# Patient Record
Sex: Female | Born: 1940
Health system: Southern US, Community
[De-identification: ages and names within clinical notes are randomized; demographics above are authoritative.]

## PROBLEM LIST (undated history)

## (undated) DIAGNOSIS — H9190 Unspecified hearing loss, unspecified ear: Secondary | ICD-10-CM

## (undated) DIAGNOSIS — F419 Anxiety disorder, unspecified: Secondary | ICD-10-CM

## (undated) DIAGNOSIS — F32A Depression, unspecified: Secondary | ICD-10-CM

## (undated) DIAGNOSIS — H9192 Unspecified hearing loss, left ear: Secondary | ICD-10-CM

## (undated) DIAGNOSIS — K810 Acute cholecystitis: Secondary | ICD-10-CM

## (undated) DIAGNOSIS — F329 Major depressive disorder, single episode, unspecified: Secondary | ICD-10-CM

## (undated) DIAGNOSIS — D649 Anemia, unspecified: Secondary | ICD-10-CM

## (undated) DIAGNOSIS — L719 Rosacea, unspecified: Secondary | ICD-10-CM

## (undated) DIAGNOSIS — E119 Type 2 diabetes mellitus without complications: Secondary | ICD-10-CM

## (undated) DIAGNOSIS — T7840XA Allergy, unspecified, initial encounter: Secondary | ICD-10-CM

## (undated) DIAGNOSIS — K219 Gastro-esophageal reflux disease without esophagitis: Secondary | ICD-10-CM

## (undated) DIAGNOSIS — E039 Hypothyroidism, unspecified: Secondary | ICD-10-CM

## (undated) DIAGNOSIS — C189 Malignant neoplasm of colon, unspecified: Secondary | ICD-10-CM

## (undated) DIAGNOSIS — Z8619 Personal history of other infectious and parasitic diseases: Secondary | ICD-10-CM

## (undated) DIAGNOSIS — G709 Myoneural disorder, unspecified: Secondary | ICD-10-CM

## (undated) DIAGNOSIS — E860 Dehydration: Secondary | ICD-10-CM

## (undated) DIAGNOSIS — T63331A Toxic effect of venom of brown recluse spider, accidental (unintentional), initial encounter: Secondary | ICD-10-CM

## (undated) DIAGNOSIS — R32 Unspecified urinary incontinence: Secondary | ICD-10-CM

## (undated) DIAGNOSIS — J302 Other seasonal allergic rhinitis: Secondary | ICD-10-CM

## (undated) DIAGNOSIS — Z862 Personal history of diseases of the blood and blood-forming organs and certain disorders involving the immune mechanism: Secondary | ICD-10-CM

## (undated) DIAGNOSIS — M1990 Unspecified osteoarthritis, unspecified site: Secondary | ICD-10-CM

## (undated) DIAGNOSIS — Z9221 Personal history of antineoplastic chemotherapy: Secondary | ICD-10-CM

## (undated) DIAGNOSIS — E785 Hyperlipidemia, unspecified: Secondary | ICD-10-CM

## (undated) DIAGNOSIS — R9431 Abnormal electrocardiogram [ECG] [EKG]: Secondary | ICD-10-CM

## (undated) HISTORY — DX: Acute cholecystitis: K81.0

## (undated) HISTORY — DX: Hypothyroidism, unspecified: E03.9

## (undated) HISTORY — DX: Personal history of diseases of the blood and blood-forming organs and certain disorders involving the immune mechanism: Z86.2

## (undated) HISTORY — DX: Other seasonal allergic rhinitis: J30.2

## (undated) HISTORY — DX: Unspecified hearing loss, unspecified ear: H91.90

## (undated) HISTORY — DX: Gastro-esophageal reflux disease without esophagitis: K21.9

## (undated) HISTORY — DX: Anemia, unspecified: D64.9

## (undated) HISTORY — DX: Unspecified osteoarthritis, unspecified site: M19.90

## (undated) HISTORY — DX: Personal history of other infectious and parasitic diseases: Z86.19

## (undated) HISTORY — DX: Malignant neoplasm of colon, unspecified: C18.9

## (undated) HISTORY — DX: Anxiety disorder, unspecified: F41.9

## (undated) HISTORY — DX: Depression, unspecified: F32.A

## (undated) HISTORY — DX: Unspecified hearing loss, left ear: H91.92

## (undated) HISTORY — PX: ABDOMINAL HYSTERECTOMY: SHX81

## (undated) HISTORY — DX: Abnormal electrocardiogram (ECG) (EKG): R94.31

## (undated) HISTORY — DX: Myoneural disorder, unspecified: G70.9

## (undated) HISTORY — DX: Unspecified urinary incontinence: R32

## (undated) HISTORY — DX: Allergy, unspecified, initial encounter: T78.40XA

## (undated) HISTORY — DX: Type 2 diabetes mellitus without complications: E11.9

## (undated) HISTORY — PX: MOUTH SURGERY: SHX715

## (undated) HISTORY — DX: Dehydration: E86.0

## (undated) HISTORY — DX: Major depressive disorder, single episode, unspecified: F32.9

## (undated) HISTORY — DX: Hyperlipidemia, unspecified: E78.5

## (undated) HISTORY — DX: Rosacea, unspecified: L71.9

## (undated) HISTORY — PX: POLYPECTOMY: SHX149

---
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
## 1983-01-26 HISTORY — PX: TOTAL VAGINAL HYSTERECTOMY: SHX2548

## 1994-01-25 HISTORY — PX: ABDOMINOPLASTY: SUR9

## 1994-01-25 HISTORY — PX: INCONTINENCE SURGERY: SHX676

## 2003-01-26 LAB — HM DEXA SCAN: HM DEXA SCAN: NORMAL

## 2004-01-26 DIAGNOSIS — E119 Type 2 diabetes mellitus without complications: Secondary | ICD-10-CM

## 2004-01-26 HISTORY — DX: Type 2 diabetes mellitus without complications: E11.9

## 2006-08-26 HISTORY — PX: COLONOSCOPY: SHX174

## 2009-04-01 LAB — COMPREHENSIVE METABOLIC PANEL
Alkaline Phosphatase: 82 U/L
Calcium: 9.6 mg/dL
Sodium: 142 mmol/L (ref 137–147)

## 2009-04-01 LAB — LIPID PANEL
Cholesterol, Total: 235
Direct LDL: 157
HDL: 47 mg/dL (ref 35–70)

## 2010-11-26 ENCOUNTER — Ambulatory Visit (INDEPENDENT_AMBULATORY_CARE_PROVIDER_SITE_OTHER): Payer: BC Managed Care – PPO | Admitting: Family Medicine

## 2010-11-26 ENCOUNTER — Encounter: Payer: Self-pay | Admitting: Family Medicine

## 2010-11-26 DIAGNOSIS — Z862 Personal history of diseases of the blood and blood-forming organs and certain disorders involving the immune mechanism: Secondary | ICD-10-CM

## 2010-11-26 DIAGNOSIS — Z23 Encounter for immunization: Secondary | ICD-10-CM

## 2010-11-26 DIAGNOSIS — E039 Hypothyroidism, unspecified: Secondary | ICD-10-CM

## 2010-11-26 DIAGNOSIS — R32 Unspecified urinary incontinence: Secondary | ICD-10-CM

## 2010-11-26 DIAGNOSIS — N3941 Urge incontinence: Secondary | ICD-10-CM | POA: Insufficient documentation

## 2010-11-26 DIAGNOSIS — F331 Major depressive disorder, recurrent, moderate: Secondary | ICD-10-CM | POA: Insufficient documentation

## 2010-11-26 DIAGNOSIS — F329 Major depressive disorder, single episode, unspecified: Secondary | ICD-10-CM

## 2010-11-26 DIAGNOSIS — E1169 Type 2 diabetes mellitus with other specified complication: Secondary | ICD-10-CM | POA: Insufficient documentation

## 2010-11-26 DIAGNOSIS — R7303 Prediabetes: Secondary | ICD-10-CM | POA: Insufficient documentation

## 2010-11-26 DIAGNOSIS — E119 Type 2 diabetes mellitus without complications: Secondary | ICD-10-CM

## 2010-11-26 DIAGNOSIS — D509 Iron deficiency anemia, unspecified: Secondary | ICD-10-CM | POA: Insufficient documentation

## 2010-11-26 DIAGNOSIS — K219 Gastro-esophageal reflux disease without esophagitis: Secondary | ICD-10-CM

## 2010-11-26 NOTE — Assessment & Plan Note (Signed)
Mild. Check TSH today.

## 2010-11-26 NOTE — Progress Notes (Signed)
Addended by: Josph Macho A on: 11/26/2010 04:08 PM   Modules accepted: Orders

## 2010-11-26 NOTE — Progress Notes (Addendum)
Subjective:    Patient ID: Lisa Osborne, female    DOB: May 27, 1940, 70 y.o.   MRN: [id number]  HPI CC: new pt, establish  Recently moved here from [place] to be closer to daughter and grandson.  Brings copy of records.  Will review and input into chart, then call pt to pick up her records.  Scabies issue for last 6 weeks, now clearing up well.  Just has scars left over.  Used homeopathic remedy.   T2DM - dx 2006.  Never been on meds for this.  Diet controlled.  Last A1c was 1 year ago, <7%, unsure specific.  Occasionally checks sugars. Keeps eye on feet.  Due for vision check.  Depression - severe.  States inherited from family.  Tries to monitor.  2 years ago had SAD.  Last year did fine.  On zoloft for 25 years.  No SI/HI.  Well controlled currently.  GERD - if misses dose of PPI, terrible heart burn at night.  Takes lansoprazole since 1985.  Urinary incontinence - for last 4-5 years, noticing some accident/dribbling, more when turning on left side or when sitting up from commode feels urine leak.  No stress accidents.  Some urge incontinence.  Wears pad.  Avoids drinking fluid at night.  Had bladder sling done in 1990s.  Preventative: Last CPE 1 year ago, had blood work then. Last pap was 1 year ago, always normal. Last mammogram 1 year ago, always normal. Colonoscopy - 2010, told rpt in 10 years. Tetanus - long time >10 yrs. Would like flu shot today. Pneumonia shot - not done. Shingles shot - not done.  Medications and allergies reviewed and updated in chart.  Past histories reviewed and updated if relevant as below. There is no problem list on file for this patient.  Past Medical History  Diagnosis Date  . History of iron deficiency anemia   . Arthritis     left side lower back pain  . History of chicken pox   . Depression     severe in past  . Diabetes mellitus type 2, controlled 2006    diet controlled  . GERD (gastroesophageal reflux disease)     bad if off PPI    . Seasonal allergies   . Hypothyroidism   . Urine incontinence     mild, wears pad   Past Surgical History  Procedure Date  . Total vaginal hysterectomy 1985    questionable cells on colposcopy  . Abdominoplasty 1996  . Incontinence surgery 1996   History  Substance Use Topics  . Smoking status: Never Smoker   . Smokeless tobacco: Never Used  . Alcohol Use: Yes     seldom   Family History  Problem Relation Age of Onset  . Cancer Mother 23    breast cancer then lymphoma  . Alcohol abuse Father   . Heart disease Father     heart failure  . Mental illness Brother     2 brothers severe depression, suicide x2  . Diabetes Maternal Aunt   . Diabetes Maternal Uncle   . Diabetes Paternal Uncle   . Diabetes Paternal Grandfather   . Coronary artery disease Neg Hx   . Stroke Neg Hx    Allergies  Allergen Reactions  . Nickel     Slight itch  . Shrimp (Shellfish Allergy)     Slight allergy - itching   No current outpatient prescriptions on file prior to visit.     Review of Systems  Constitutional: Negative for fever, chills, activity change, appetite change, fatigue and unexpected weight change (16 lbs down, dieting, watching portions).  HENT: Negative for hearing loss and neck pain.   Eyes: Negative for visual disturbance.  Respiratory: Negative for cough, chest tightness, shortness of breath and wheezing.   Gastrointestinal: Negative for nausea, vomiting, abdominal pain, diarrhea, constipation, blood in stool and abdominal distention.  Genitourinary: Negative for hematuria and difficulty urinating.  Musculoskeletal: Negative for myalgias and arthralgias.  Skin: Positive for rash.  Neurological: Negative for dizziness, seizures, syncope and headaches.  Hematological: Does not bruise/bleed easily.  Psychiatric/Behavioral: Positive for dysphoric mood. The patient is not nervous/anxious.        Objective:   Physical Exam  Nursing note and vitals  reviewed. Constitutional: She is oriented to person, place, and time. She appears well-developed and well-nourished. No distress.  HENT:  Head: Normocephalic and atraumatic.  Right Ear: External ear normal.  Left Ear: External ear normal.  Nose: Nose normal.  Mouth/Throat: Oropharynx is clear and moist.  Eyes: Conjunctivae and EOM are normal. Pupils are equal, round, and reactive to light.  Neck: Normal range of motion. Neck supple. No thyromegaly present.  Cardiovascular: Normal rate, regular rhythm, normal heart sounds and intact distal pulses.   No murmur heard. Pulses:      Radial pulses are 2+ on the right side, and 2+ on the left side.  Pulmonary/Chest: Effort normal and breath sounds normal. No respiratory distress. She has no wheezes. She has no rales.  Abdominal: Soft. Bowel sounds are normal. She exhibits no distension and no mass. There is no tenderness. There is no rebound and no guarding.  Musculoskeletal: Normal range of motion.  Lymphadenopathy:    She has no cervical adenopathy.  Neurological: She is alert and oriented to person, place, and time.       CN grossly intact, station and gait intact  Skin: Skin is warm and dry. No rash noted.  Psychiatric: She has a normal mood and affect. Her behavior is normal. Judgment and thought content normal.      Assessment & Plan:

## 2010-11-26 NOTE — Assessment & Plan Note (Signed)
Very well controlled per pt just with diet changes. Never had A1c >7%. Check today as states >1 yr since last checked.  Rarely checks sugars. rec vision screen.

## 2010-11-26 NOTE — Assessment & Plan Note (Signed)
Check cbc 

## 2010-11-26 NOTE — Assessment & Plan Note (Signed)
Controlled with low dose prevacid 15mg  daily.

## 2010-11-26 NOTE — Patient Instructions (Addendum)
Call your insurace about the shingles shot to see if it is covered or how much it would cost and where is cheaper (here or pharmacy).  If you want to receive here, call for nurse visit. Return for physical at your convenience March or April of next year. Return prior fasting for blood work and we will check things then. We have checked A1c today as well as kidney function. Good to see you today! Flu shot today. Set up vision screen.

## 2010-11-26 NOTE — Assessment & Plan Note (Signed)
Currently not an issue. Pt knows to continue to monitor.

## 2010-11-27 LAB — CBC WITH DIFFERENTIAL/PLATELET
Basophils Absolute: 0 10*3/uL (ref 0.0–0.1)
Eosinophils Relative: 0.6 % (ref 0.0–5.0)
HCT: 36.1 % (ref 36.0–46.0)
Lymphs Abs: 1.7 10*3/uL (ref 0.7–4.0)
Monocytes Absolute: 0.3 10*3/uL (ref 0.1–1.0)
Monocytes Relative: 4.7 % (ref 3.0–12.0)
Neutrophils Relative %: 62.8 % (ref 43.0–77.0)
Platelets: 220 10*3/uL (ref 150.0–400.0)
RDW: 14.1 % (ref 11.5–14.6)
WBC: 5.5 10*3/uL (ref 4.5–10.5)

## 2010-11-27 LAB — BASIC METABOLIC PANEL
Calcium: 8.8 mg/dL (ref 8.4–10.5)
GFR: 62.77 mL/min (ref >60.00)
Potassium: 4.4 mEq/L (ref 3.5–5.1)
Sodium: 142 mEq/L (ref 135–145)

## 2010-11-27 LAB — HEMOGLOBIN A1C: Hgb A1c MFr Bld: 6.2 % (ref 4.6–6.5)

## 2010-11-27 LAB — TSH: TSH: 1.69 u[IU]/mL (ref 0.35–5.50)

## 2010-11-29 ENCOUNTER — Encounter: Payer: Self-pay | Admitting: Family Medicine

## 2010-12-07 ENCOUNTER — Encounter: Payer: Self-pay | Admitting: Family Medicine

## 2010-12-21 ENCOUNTER — Other Ambulatory Visit: Payer: Self-pay | Admitting: *Deleted

## 2010-12-21 MED ORDER — LEVOTHYROXINE SODIUM 25 MCG PO TABS: 25.0000 ug | ORAL_TABLET | Freq: Every day | ORAL | Status: DC

## 2011-04-18 ENCOUNTER — Other Ambulatory Visit: Payer: Self-pay | Admitting: Family Medicine

## 2011-05-06 ENCOUNTER — Encounter: Payer: Self-pay | Admitting: Family Medicine

## 2011-05-06 ENCOUNTER — Ambulatory Visit (INDEPENDENT_AMBULATORY_CARE_PROVIDER_SITE_OTHER): Payer: Medicare Other | Admitting: Family Medicine

## 2011-05-06 VITALS — BP 138/90 | HR 81 | Temp 98.2°F | Ht 69.5 in | Wt 234.5 lb

## 2011-05-06 DIAGNOSIS — R21 Rash and other nonspecific skin eruption: Secondary | ICD-10-CM | POA: Insufficient documentation

## 2011-05-06 DIAGNOSIS — B372 Candidiasis of skin and nail: Secondary | ICD-10-CM | POA: Insufficient documentation

## 2011-05-06 MED ORDER — PREDNISONE 20 MG PO TABS: ORAL_TABLET | ORAL | Status: DC

## 2011-05-06 NOTE — Patient Instructions (Addendum)
You have contact dermatitis from poison oak or sumac. Treat with steroid course. Consider oatmeal bath to help itch. Continue calomine and goldbond. No more clorox - too irritating on skin.  Poison [facility] is an inflammation of the skin (contact dermatitis). It is caused by contact with the allergens on the leaves of the oak (toxicodendron) plants. Depending on your sensitivity, the rash may consist simply of redness and itching, or it may also progress to blisters which may break open (rupture). These must be well cared for to prevent secondary germ (bacterial) infection as these infections can lead to scarring. The eyes may also get puffy. The puffiness is worst in the morning and gets better as the day progresses. Healing is best accomplished by keeping any open areas dry, clean, covered with a bandage, and covered with an antibacterial ointment if needed. Without secondary infection, this dermatitis usually heals without scarring within 2 to 3 weeks without treatment. HOME CARE INSTRUCTIONS When you have been exposed to poison oak, it is very important to thoroughly wash with soap and water as soon as the exposure has been discovered. You have about one half hour to remove the plant resin before it will cause the rash. This cleaning will quickly destroy the oil or antigen on the skin (the antigen is what causes the rash). Wash aggressively under the fingernails as any plant resin still there will continue to spread the rash. Do not rub skin vigorously when washing affected area. Poison oak cannot spread if no oil from the plant remains on your body. Rash that has progressed to weeping sores (lesions) will not spread the rash unless you have not washed thoroughly. It is also important to clean any clothes you have been wearing as they may carry active allergens which will spread the rash, even several days later. Avoidance of the plant in the future is the best measure. Poison oak plants can be  recognized by the number of leaves. Generally, poison oak has three leaves with flowering branches on a single stem. Diphenhydramine may be purchased over the counter and used as needed for itching. Do not drive with this medication if it makes you drowsy. Ask your caregiver about medication for children. SEEK IMMEDIATE MEDICAL CARE IF:   Open areas of the rash develop.   You notice redness extending beyond the area of the rash.   There is a pus like discharge.   There is increased pain.   Other signs of infection develop (such as fever).  Document Released: 07/18/2002 Document Revised: 12/31/2010 Document Reviewed: 11/27/2008 [facility] Patient Information 2012 [place], [place].

## 2011-05-06 NOTE — Progress Notes (Signed)
  Subjective:    Patient ID: Lisa Osborne, female    DOB: 1940/12/15, 71 y.o.   MRN: [id number]  HPI CC: skin rash  1 wk ago out in garden working, using gloves to wrist, that evening started having rash on right anterior wrist that started spreading to midriff and abdomen, and now on face.  Using calomine and gold bond lotion  Also took antihistamine this mornning  Very itchy.  Doesn't think any poison ivy/oak/sumac in garden.  Used chlorox wipes and wiped skin on this.  Has never been on oral steroids in past.  No new lotions, detergents, soaps, shampoos, medicines.  Review of Systems Per HPI    Objective:   Physical Exam  Nursing note and vitals reviewed. Constitutional: She appears well-developed and well-nourished. No distress.  HENT:  Head: Normocephalic.  Skin: Skin is warm and dry. Rash noted. There is erythema.          R anterior wrist and under breast with scaly erythematous rash with very pruritic borders, also isolated dry scabbed vesicles on R dorsal wrist and right abdomen. erythematous rash on left side of upper face, pruritic as well.  Psychiatric: She has a normal mood and affect.       Assessment & Plan:

## 2011-05-06 NOTE — Assessment & Plan Note (Addendum)
Contact dermatitis from poison oak/sumac. Given widespread, treat with oral steroids. See pt instructions for plan. Discussed steroid precautions.

## 2011-05-23 ENCOUNTER — Other Ambulatory Visit: Payer: Self-pay | Admitting: Family Medicine

## 2011-05-23 DIAGNOSIS — E119 Type 2 diabetes mellitus without complications: Secondary | ICD-10-CM

## 2011-05-23 DIAGNOSIS — E039 Hypothyroidism, unspecified: Secondary | ICD-10-CM

## 2011-05-23 DIAGNOSIS — Z862 Personal history of diseases of the blood and blood-forming organs and certain disorders involving the immune mechanism: Secondary | ICD-10-CM

## 2011-05-25 ENCOUNTER — Other Ambulatory Visit: Payer: BC Managed Care – PPO

## 2011-05-26 HISTORY — PX: FACELIFT: SHX1566

## 2011-05-28 ENCOUNTER — Encounter: Payer: BC Managed Care – PPO | Admitting: Family Medicine

## 2011-07-08 ENCOUNTER — Other Ambulatory Visit: Payer: BC Managed Care – PPO

## 2011-07-15 ENCOUNTER — Encounter: Payer: BC Managed Care – PPO | Admitting: Family Medicine

## 2011-09-29 ENCOUNTER — Encounter: Payer: Self-pay | Admitting: Family Medicine

## 2011-09-29 ENCOUNTER — Other Ambulatory Visit (INDEPENDENT_AMBULATORY_CARE_PROVIDER_SITE_OTHER): Payer: BC Managed Care – PPO

## 2011-09-29 ENCOUNTER — Ambulatory Visit (INDEPENDENT_AMBULATORY_CARE_PROVIDER_SITE_OTHER): Payer: Medicare Other | Admitting: Family Medicine

## 2011-09-29 VITALS — BP 136/78 | HR 78 | Temp 98.1°F | Wt 233.5 lb

## 2011-09-29 DIAGNOSIS — K921 Melena: Secondary | ICD-10-CM

## 2011-09-29 DIAGNOSIS — Z862 Personal history of diseases of the blood and blood-forming organs and certain disorders involving the immune mechanism: Secondary | ICD-10-CM

## 2011-09-29 DIAGNOSIS — E119 Type 2 diabetes mellitus without complications: Secondary | ICD-10-CM

## 2011-09-29 DIAGNOSIS — R300 Dysuria: Secondary | ICD-10-CM | POA: Insufficient documentation

## 2011-09-29 DIAGNOSIS — E039 Hypothyroidism, unspecified: Secondary | ICD-10-CM

## 2011-09-29 LAB — CBC WITH DIFFERENTIAL/PLATELET
Basophils Relative: 0.3 % (ref 0.0–3.0)
Eosinophils Relative: 0.5 % (ref 0.0–5.0)
HCT: 31.7 % — ABNORMAL LOW (ref 36.0–46.0)
Lymphs Abs: 2.4 10*3/uL (ref 0.7–4.0)
MCV: 86.3 fl (ref 78.0–100.0)
Monocytes Absolute: 0.6 10*3/uL (ref 0.1–1.0)
Platelets: 278 10*3/uL (ref 150.0–400.0)
RBC: 3.67 Mil/uL — ABNORMAL LOW (ref 3.87–5.11)
WBC: 6.5 10*3/uL (ref 4.5–10.5)

## 2011-09-29 LAB — POCT URINALYSIS DIPSTICK
Bilirubin, UA: NEGATIVE
Blood, UA: NEGATIVE
Glucose, UA: NEGATIVE
Ketones, UA: NEGATIVE
Nitrite, UA: NEGATIVE

## 2011-09-29 LAB — LIPID PANEL
Cholesterol: 212 mg/dL — ABNORMAL HIGH (ref 0–200)
Total CHOL/HDL Ratio: 4
VLDL: 28.6 mg/dL (ref 0.0–40.0)

## 2011-09-29 LAB — COMPREHENSIVE METABOLIC PANEL
ALT: 13 U/L (ref 0–35)
AST: 22 U/L (ref 0–37)
Calcium: 9.1 mg/dL (ref 8.4–10.5)
Chloride: 103 mEq/L (ref 96–112)
Creatinine, Ser: 1 mg/dL (ref 0.4–1.2)
Sodium: 136 mEq/L (ref 135–145)

## 2011-09-29 MED ORDER — LEVOTHYROXINE SODIUM 25 MCG PO TABS: 25.0000 ug | ORAL_TABLET | Freq: Every day | ORAL | Status: DC

## 2011-09-29 MED ORDER — CIPROFLOXACIN HCL 500 MG PO TABS: 500.0000 mg | ORAL_TABLET | Freq: Two times a day (BID) | ORAL | Status: AC

## 2011-09-29 MED ORDER — SERTRALINE HCL 50 MG PO TABS: 50.0000 mg | ORAL_TABLET | Freq: Every day | ORAL | Status: DC

## 2011-09-29 NOTE — Patient Instructions (Addendum)
I think you had irritation from food junk leading to looser stools which in turn led to sore and raw bottom.  As it's getting better, would just continue treatment as up to now with warm compresses, warm water soaks. Blood work today to check on anemia. Bring copy of records from colonoscopy from 2008. For urine - looks like infection. Treat with 3 day course of cipro twice daily.

## 2011-09-29 NOTE — Assessment & Plan Note (Addendum)
New problem in setting of normal colonoscopy 2008 per report. Never abd or rectal abd pain. Hemoccult + today. Recent diarrheal illness with significant junk food intake, along with raw bottom which could have caused noted blood, however story more consistent with GI source of bleed. As seems to be improving, will monitor for now and reassess at CPE in 2 wks.  Check CBC today (for physical and for recent blood in stool). I have also asked her to bring copy of colonoscopy report 2008, or may request at CPE.

## 2011-09-29 NOTE — Progress Notes (Signed)
  Subjective:    Patient ID: Lisa Osborne, female    DOB: 1940/02/07, 71 y.o.   MRN: [id number]  HPI CC: rectal bleeding  Recently drove to [place] - did eat a lot of junk food and fast food all the way there to stay awake.  On trip, stool was dark and watery, diarrhea, bottom became very raw.  Endorses dark red stool.  No black tarry stool.  Hard to clean.  On trip back had same problem.  Treated bottom area with hot compress and ensured area clean using OTC wipes.  No abd pain, n/v, constipation, fevers/chills, weight loss.  For last 2 days, stool back to normal, more regular. Bottom less raw now.  Hasn't noticed any more dark red watery stools.  H/o colonoscopy 2008 in [place] - told normal and good for 10 years.  Will bring records at physical which is scheduled 10/12/2011.  ?UTI - h/o urinary incontinence.  Now with 3-4d h/o worsening itching, external burning, dysuria, urgency and frequency.  Nocturia x6-7.  No lower back pain.  No abd pain or fevers/chills.  No vag discharge.  Wt Readings from Last 3 Encounters:  09/29/11 233 lb 8 oz (105.915 kg)  05/06/11 234 lb 8 oz (106.369 kg)  11/26/10 226 lb 12.8 oz (102.876 kg)   Past Medical History  Diagnosis Date  . History of iron deficiency anemia   . Arthritis     left side lower back pain  . History of chicken pox   . Depression     severe in past  . Diabetes mellitus type 2, controlled 2006    diet controlled  . GERD (gastroesophageal reflux disease)     bad if off PPI  . Seasonal allergies   . Hypothyroidism     mild  . Urine incontinence     mild, wears pad  . Unilateral deafnesses     left ear deaf, wears hearing aid right side  . Rosacea     Review of Systems Per HPI    Objective:   Physical Exam  Nursing note and vitals reviewed. Constitutional: She appears well-developed and well-nourished. No distress.  Abdominal: Soft. Normal appearance and bowel sounds are normal. She exhibits no distension and no mass.  There is no hepatosplenomegaly. There is tenderness (mild pressure to palpation) in the suprapubic area. There is no rebound, no guarding and no CVA tenderness.  Genitourinary: Rectal exam shows external hemorrhoid (small, noninflamed). Rectal exam shows no internal hemorrhoid, no fissure, no mass, no tenderness and anal tone normal. Guaiac positive stool.       Minimally raw bottom  Skin: Skin is warm and dry. No rash noted.      Assessment & Plan:

## 2011-09-29 NOTE — Assessment & Plan Note (Signed)
With worsening urge, UA with 1+ LE but micro with large amt of epithelial cells.  Have sent off culture, but will treat as presumed UTI with 3d course cipro.

## 2011-10-01 LAB — URINE CULTURE

## 2011-10-12 ENCOUNTER — Ambulatory Visit (INDEPENDENT_AMBULATORY_CARE_PROVIDER_SITE_OTHER): Payer: BC Managed Care – PPO | Admitting: Family Medicine

## 2011-10-12 ENCOUNTER — Encounter: Payer: Self-pay | Admitting: Family Medicine

## 2011-10-12 VITALS — BP 116/78 | HR 84 | Temp 98.5°F | Ht 69.5 in | Wt 235.8 lb

## 2011-10-12 DIAGNOSIS — E039 Hypothyroidism, unspecified: Secondary | ICD-10-CM

## 2011-10-12 DIAGNOSIS — D509 Iron deficiency anemia, unspecified: Secondary | ICD-10-CM

## 2011-10-12 DIAGNOSIS — F329 Major depressive disorder, single episode, unspecified: Secondary | ICD-10-CM

## 2011-10-12 DIAGNOSIS — N3941 Urge incontinence: Secondary | ICD-10-CM

## 2011-10-12 DIAGNOSIS — Z1231 Encounter for screening mammogram for malignant neoplasm of breast: Secondary | ICD-10-CM

## 2011-10-12 DIAGNOSIS — Z0000 Encounter for general adult medical examination without abnormal findings: Secondary | ICD-10-CM | POA: Insufficient documentation

## 2011-10-12 DIAGNOSIS — E119 Type 2 diabetes mellitus without complications: Secondary | ICD-10-CM

## 2011-10-12 DIAGNOSIS — Z1211 Encounter for screening for malignant neoplasm of colon: Secondary | ICD-10-CM

## 2011-10-12 DIAGNOSIS — K921 Melena: Secondary | ICD-10-CM

## 2011-10-12 MED ORDER — OXYBUTYNIN CHLORIDE 5 MG PO TABS: 5.0000 mg | ORAL_TABLET | Freq: Three times a day (TID) | ORAL | Status: DC | PRN

## 2011-10-12 NOTE — Patient Instructions (Addendum)
Return in 1 mo for flu shot or at [facility]. Pass by Marion's office to schedule screening mammogram around in 1 month. Stool kit today - complete next month and return. Call your insurace about the shingles shot to see if it is covered or how much it would cost and where is cheaper (here or pharmacy).  If you want to receive here, call for nurse visit.  Try bladder medicine - oxybutynin twice to three times daily as needed.  Let me know how this is working. Return in 6 months for follow up or sooner if needed.

## 2011-10-12 NOTE — Assessment & Plan Note (Signed)
I have personally reviewed the Medicare Annual Wellness questionnaire and have noted 1. The patient's medical and social history 2. Their use of alcohol, tobacco or illicit drugs 3. Their current medications and supplements 4. The patient's functional ability including ADL's, fall risks, home safety risks and hearing or visual impairment. 5. Diet and physical activity 6. Evidence for depression or mood disorders The patients weight, height, BMI have been recorded in the chart.  Hearing and vision has been addressed. I have made referrals, counseling and provided education to the patient based review of the above and I have provided the pt with a written personalized care plan for preventive services. See scanned questionairre.  Reviewed preventative protocols and updated unless pt declined.  Declines flu shot - will receive in 1-2 mo. Will look into shingles shot. Will need discussion on dexa scan.

## 2011-10-12 NOTE — Assessment & Plan Note (Signed)
Has ups and downs.  Stresses out easily.  Tolerant of current sertraline dose - longterm med, strong family hx and strong h/o suicide.

## 2011-10-12 NOTE — Assessment & Plan Note (Signed)
UTD colonoscopy - however given blood in stool last visit, will check earlier iFOB, asked her to complete next month.  If continued positive, refer to GI.  Pt agrees with plan. In setting of anemia, presumed iron def (h/o this).  Taking iron supplement daily.

## 2011-10-12 NOTE — Assessment & Plan Note (Signed)
Continues diet controlled. Lab Results  Component Value Date   HGBA1C 6.5 09/29/2011

## 2011-10-12 NOTE — Progress Notes (Addendum)
Subjective:    Patient ID: Lisa Osborne, female    DOB: [birth year], 71 y.o.   MRN: [id number]  HPI CC: medicare wellness visit  Wt Readings from Last 3 Encounters:  10/12/11 235 lb 12 oz (106.935 kg)  09/29/11 233 lb 8 oz (105.915 kg)  05/06/11 234 lb 8 oz (106.369 kg)   Bowel issues resolved.  Hasn't noticed any more bleeding.  Positive hemoccult last visit with noted anemia.  This was in setting of presumed enteritis that has since resolved.  Brings records of colonoscopy done 08/2004, will input into chart.  Some DOE, attributes to deconditioning after facelift, slowly increasing stamina, [place]recumbent bike and treadmill[place]  Urinary incontinence - worsening over last year.  Significant nocturia.  Some leakage during day.  + key in door and foot on floor phenomenon.  Not worse with cough/sneeze/laughing.  Does not drink anything after 6pm.  Minimal caffeine daily - has cut back this month.  H/o bladder sling several years ago.  Has never tried meds for urinary incontinence.  Wears pad daily 2/2 leaking.  Preventative: Colonoscopy WNL 08/2006.  rec rpt 10 yrs. Last pap was 1 year ago, always normal. Will space out - would like to repeat in 2015 Last mammogram 1 year ago, always normal. Would like to schedule in 1 mo (financial concerns Tetanus - 2010 Flu shot next month. Shingles shot - not done because to expensive. Pneumonia shot completed.  No falls in last year.  hearing passed (known hearing loss on left) Vision screen passed.  Last glaucoma screen was >2 yrs ago.  Caffeine: 1 cup coffee/day Lives alone.  1 daughter Victorino Dike), 3 stillborns Occupation: retired, was Air traffic controller. Activity: no regular activity but does park far away, has treadmill at home Diet: good fruits/vegetables, more fruit/vegetable smoothies  good amt water  Medications and allergies reviewed and updated in chart.  Past histories reviewed and updated if relevant as  below. Patient Active Problem List  Diagnosis  . History of iron deficiency anemia  . Depression  . Diabetes mellitus type 2, controlled  . GERD (gastroesophageal reflux disease)  . Hypothyroidism  . Urine incontinence  . Skin rash  . Dysuria  . Blood in stool  . Medicare annual wellness visit, initial   Past Medical History  Diagnosis Date  . History of iron deficiency anemia   . Arthritis     left side lower back pain  . History of chicken pox   . Depression     severe in past  . Diabetes mellitus type 2, controlled 2006    diet controlled  . GERD (gastroesophageal reflux disease)     bad if off PPI  . Seasonal allergies   . Hypothyroidism     mild  . Urine incontinence     mild, wears pad  . Unilateral deafnesses     left ear deaf, wears hearing aid right side  . Rosacea    Past Surgical History  Procedure Date  . Total vaginal hysterectomy 1985    cancer cells on cervix  . Abdominoplasty 1996  . Incontinence surgery 1996    bladder sling  . Colonoscopy 08/2006    normal  . Dexa 08/06/2003    normal  . Facelift 05/2011   History  Substance Use Topics  . Smoking status: Never Smoker   . Smokeless tobacco: Never Used  . Alcohol Use: Yes     seldom   Family History  Problem Relation  Age of Onset  . Cancer Mother 67    breast cancer then multiple myeloma  . Alcohol abuse Father   . Heart disease Father     heart failure  . Mental illness Brother     2 brothers severe depression, suicide x2  . Diabetes Maternal Aunt   . Diabetes Maternal Uncle   . Diabetes Paternal Uncle   . Diabetes Paternal Grandfather   . Coronary artery disease Neg Hx   . Stroke Neg Hx    Allergies  Allergen Reactions  . Latex     Mild reaction  . Nickel     Slight itch  . Shrimp (Shellfish Allergy)     Slight allergy - itching   Current Outpatient Prescriptions on File Prior to Visit  Medication Sig Dispense Refill  . calcium gluconate 500 MG tablet Take 500 mg by  mouth daily.        . cholecalciferol (VITAMIN D) 1000 UNITS tablet Take 1,000 Units by mouth daily.        . ferrous sulfate 325 (65 FE) MG tablet Take 325 mg by mouth daily with breakfast.      . lansoprazole (PREVACID) 15 MG capsule Take 15 mg by mouth daily.        Marland Kitchen levothyroxine (SYNTHROID, LEVOTHROID) 25 MCG tablet Take 1 tablet (25 mcg total) by mouth daily.  90 tablet  3  . Multiple Vitamin (MULTIVITAMIN) tablet Take 1 tablet by mouth daily.        . NON FORMULARY 2 tablets daily. Memory and Brain with Acetyl L-Carnitine       . sertraline (ZOLOFT) 50 MG tablet Take 1 tablet (50 mg total) by mouth daily.  90 tablet  3  . oxybutynin (DITROPAN) 5 MG tablet Take 1 tablet (5 mg total) by mouth 3 (three) times daily as needed.  60 tablet  0     Review of Systems  Constitutional: Negative for fever, chills, activity change, appetite change, fatigue and unexpected weight change.  HENT: Negative for hearing loss and neck pain.   Eyes: Negative for visual disturbance.  Respiratory: Negative for cough, chest tightness, shortness of breath and wheezing.   Cardiovascular: Negative for chest pain, palpitations and leg swelling.  Gastrointestinal: Negative for nausea, vomiting, abdominal pain, diarrhea, constipation, blood in stool and abdominal distention.  Genitourinary: Negative for hematuria and difficulty urinating.  Musculoskeletal: Negative for myalgias and arthralgias.  Skin: Negative for rash.  Neurological: Negative for dizziness, seizures, syncope and headaches.  Hematological: Does not bruise/bleed easily.  Psychiatric/Behavioral: Negative for dysphoric mood. The patient is nervous/anxious.       Objective:   Physical Exam  Nursing note and vitals reviewed. Constitutional: She is oriented to person, place, and time. She appears well-developed and well-nourished. No distress.  HENT:  Head: Normocephalic and atraumatic.  Right Ear: Hearing, tympanic membrane and external ear  normal.  Left Ear: Hearing, tympanic membrane, external ear and ear canal normal.  Nose: Nose normal.  Mouth/Throat: Oropharynx is clear and moist. No oropharyngeal exudate.  Eyes: Conjunctivae normal and EOM are normal. Pupils are equal, round, and reactive to light. No scleral icterus.  Neck: Normal range of motion. Neck supple. Carotid bruit is not present. No thyromegaly present.  Cardiovascular: Normal rate, regular rhythm, normal heart sounds and intact distal pulses.   No murmur heard. Pulses:      Radial pulses are 2+ on the right side, and 2+ on the left side.  Pulmonary/Chest: Effort normal and  breath sounds normal. No respiratory distress. She has no wheezes. She has no rales. Right breast exhibits no inverted nipple, no mass, no nipple discharge, no skin change and no tenderness. Left breast exhibits no inverted nipple, no mass, no nipple discharge, no skin change and no tenderness. Breasts are symmetrical.  Abdominal: Soft. Bowel sounds are normal. She exhibits no distension and no mass. There is no tenderness. There is no rebound and no guarding.  Musculoskeletal: Normal range of motion. She exhibits no edema.  Lymphadenopathy:    She has no cervical adenopathy.    She has no axillary adenopathy.       Right axillary: No lateral adenopathy present.       Left axillary: No lateral adenopathy present.      Right: No supraclavicular adenopathy present.       Left: No supraclavicular adenopathy present.  Neurological: She is alert and oriented to person, place, and time.       CN grossly intact, station and gait intact  Skin: Skin is warm and dry. No rash noted.  Psychiatric: She has a normal mood and affect.       Assessment & Plan:

## 2011-10-12 NOTE — Assessment & Plan Note (Signed)
Stable on current dose. Continue.

## 2011-10-12 NOTE — Assessment & Plan Note (Addendum)
Discussed this.  Anticipate urge incontinence. Trial of oxybutynin tid prn. H/o bladder sling 1996.

## 2011-10-12 NOTE — Assessment & Plan Note (Signed)
Consider rechecking levels to ensure rising. Did not recheck today as diarrhea has resolved and feeling much better. Denies dizziness.

## 2011-11-11 ENCOUNTER — Other Ambulatory Visit: Payer: Self-pay | Admitting: Family Medicine

## 2011-11-30 ENCOUNTER — Ambulatory Visit: Payer: Self-pay | Admitting: Family Medicine

## 2011-12-06 ENCOUNTER — Encounter: Payer: Self-pay | Admitting: Family Medicine

## 2011-12-07 ENCOUNTER — Encounter: Payer: Self-pay | Admitting: *Deleted

## 2012-01-03 ENCOUNTER — Other Ambulatory Visit: Payer: Self-pay | Admitting: Family Medicine

## 2012-01-03 ENCOUNTER — Other Ambulatory Visit: Payer: Medicare Other

## 2012-01-03 DIAGNOSIS — Z1211 Encounter for screening for malignant neoplasm of colon: Secondary | ICD-10-CM

## 2012-01-03 DIAGNOSIS — R195 Other fecal abnormalities: Secondary | ICD-10-CM

## 2012-01-03 DIAGNOSIS — D509 Iron deficiency anemia, unspecified: Secondary | ICD-10-CM

## 2012-01-03 LAB — FECAL OCCULT BLOOD, IMMUNOCHEMICAL: Fecal Occult Bld: POSITIVE

## 2012-01-06 ENCOUNTER — Other Ambulatory Visit (INDEPENDENT_AMBULATORY_CARE_PROVIDER_SITE_OTHER): Payer: Medicare Other

## 2012-01-06 ENCOUNTER — Encounter: Payer: Self-pay | Admitting: Gastroenterology

## 2012-01-06 DIAGNOSIS — D509 Iron deficiency anemia, unspecified: Secondary | ICD-10-CM

## 2012-01-06 LAB — FERRITIN: Ferritin: 47.5 ng/mL (ref 10.0–291.0)

## 2012-01-06 LAB — CBC WITH DIFFERENTIAL/PLATELET
Eosinophils Relative: 0.7 % (ref 0.0–5.0)
Lymphocytes Relative: 36.8 % (ref 12.0–46.0)
Monocytes Relative: 7.4 % (ref 3.0–12.0)
Neutrophils Relative %: 54.8 % (ref 43.0–77.0)
Platelets: 248 10*3/uL (ref 150.0–400.0)
WBC: 5.8 10*3/uL (ref 4.5–10.5)

## 2012-01-06 LAB — IBC PANEL
Iron: 171 ug/dL — ABNORMAL HIGH (ref 42–145)
Transferrin: 325 mg/dL (ref 212.0–360.0)

## 2012-02-14 ENCOUNTER — Encounter: Payer: Self-pay | Admitting: Gastroenterology

## 2012-02-14 ENCOUNTER — Ambulatory Visit (INDEPENDENT_AMBULATORY_CARE_PROVIDER_SITE_OTHER): Payer: Medicare PPO | Admitting: Gastroenterology

## 2012-02-14 VITALS — BP 120/72 | HR 92 | Ht 69.5 in | Wt 229.2 lb

## 2012-02-14 DIAGNOSIS — D649 Anemia, unspecified: Secondary | ICD-10-CM

## 2012-02-14 MED ORDER — NA SULFATE-K SULFATE-MG SULF 17.5-3.13-1.6 GM/177ML PO SOLN: 1.0000 | Freq: Once | ORAL | Status: DC

## 2012-02-14 NOTE — Assessment & Plan Note (Signed)
Hemoccult-positive stool, iron deficiency anemia and history of rectal bleeding may all be attributable to a colonic bleeding source. Polyps, AVMs or neoplasm are possibilities. Additionly, patient had been taking NSAIDs regularly which could cause ulcerations anywhere along the GI tract. Rectal bleeding may have been hemorrhoidal.  Recommendations #1 colonoscopy; if negative I will proceed with upper endoscopy

## 2012-02-14 NOTE — Progress Notes (Signed)
History of Present Illness: Pleasant 72 year old white female referred at the request of Dr. Sharen Hones for evaluation of Hemoccult-positive stools. In August, 2013 she had limited episodes of blood mixed with stools. She tested Hemoccult positive then and several months later. She was told that she had an iron deficiency anemia and was started on iron. She was taking NSAIDs up to a couple of months ago. Colonoscopy in 2008, by report, was normal. Her main GI complaint is rather frequent crampy lower bowel pain. There has been no change in her bowel habits, per se. Denies pyrosis or dysphagia.  She's been taking iron supplementation for several months. Approximately one month ago hemoglobin was 10.6 with a normal MCV.    Past Medical History  Diagnosis Date  . History of iron deficiency anemia   . Arthritis     left side lower back pain  . History of chicken pox   . Depression     severe in past  . Diabetes mellitus type 2, controlled 2006    diet controlled  . GERD (gastroesophageal reflux disease)     bad if off PPI  . Seasonal allergies   . Hypothyroidism     mild  . Urine incontinence     mild, wears pad  . Unilateral deafnesses     left ear deaf, wears hearing aid right side  . Rosacea   . Anemia    Past Surgical History  Procedure Date  . Total vaginal hysterectomy 1985    cancer cells on cervix  . Abdominoplasty 1996  . Incontinence surgery 1996    bladder sling  . Colonoscopy 08/2006    normal  . Dexa 08/06/2003    normal  . Facelift 05/2011   family history includes Alcohol abuse in her father; Breast cancer (age of onset:60) in her mother; Diabetes in her maternal aunt, maternal uncle, paternal grandfather, and paternal uncle; Heart disease in her father; and Mental illness in her brother.  There is no history of Coronary artery disease and Stroke. Current Outpatient Prescriptions  Medication Sig Dispense Refill  . calcium gluconate 500 MG tablet Take 500 mg by mouth  daily.        . cholecalciferol (VITAMIN D) 1000 UNITS tablet Take 1,000 Units by mouth daily.        . ferrous sulfate 325 (65 FE) MG tablet Take 325 mg by mouth daily with breakfast.      . lansoprazole (PREVACID) 15 MG capsule Take 15 mg by mouth daily.        Marland Kitchen levothyroxine (SYNTHROID, LEVOTHROID) 25 MCG tablet Take 1 tablet (25 mcg total) by mouth daily.  90 tablet  3  . Multiple Vitamin (MULTIVITAMIN) tablet Take 1 tablet by mouth daily.        . sertraline (ZOLOFT) 50 MG tablet Take 1 tablet (50 mg total) by mouth daily.  90 tablet  3  . [DISCONTINUED] oxybutynin (DITROPAN) 5 MG tablet Take 1 tablet (5 mg total) by mouth 3 (three) times daily as needed.  60 tablet  0   Allergies as of 02/14/2012 - Review Complete 02/14/2012  Allergen Reaction Noted  . Latex  11/29/2010  . Nickel  11/26/2010  . Shrimp (shellfish allergy)  11/26/2010    reports that she has never smoked. She has never used smokeless tobacco. She reports that she drinks alcohol. She reports that she does not use illicit drugs.     Review of Systems: Pertinent positive and negative review of systems were  noted in the above HPI section. All other review of systems were otherwise negative.  Vital signs were reviewed in today's medical record Physical Exam: General: Well developed , well nourished, no acute distress Head: Normocephalic and atraumatic Eyes:  sclerae anicteric, EOMI Ears: Normal auditory acuity Mouth: No deformity or lesions Neck: Supple, no masses or thyromegaly Lungs: Clear throughout to auscultation Heart: Regular rate and rhythm; no murmurs, rubs or bruits Abdomen: Soft, non tender and non distended. No masses, hepatosplenomegaly or hernias noted. Normal Bowel sounds Rectal:deferred Musculoskeletal: Symmetrical with no gross deformities  Skin: No lesions on visible extremities Pulses:  Normal pulses noted Extremities: No clubbing, cyanosis, edema or deformities noted Neurological: Alert  oriented x 4, grossly nonfocal Cervical Nodes:  No significant cervical adenopathy Inguinal Nodes: No significant inguinal adenopathy Psychological:  Alert and cooperative. Normal mood and affect

## 2012-02-14 NOTE — Patient Instructions (Addendum)
You have been scheduled for a colonoscopy with propofol. Please follow written instructions given to you at your visit today.  Please pick up your prep kit at the pharmacy within the next 1-3 days. If you use inhalers (even only as needed) or a CPAP machine, please bring them with you on the day of your procedure.  

## 2012-02-21 ENCOUNTER — Encounter: Payer: Medicare Other | Admitting: Gastroenterology

## 2012-03-07 ENCOUNTER — Ambulatory Visit (AMBULATORY_SURGERY_CENTER): Payer: Medicare PPO | Admitting: Gastroenterology

## 2012-03-07 ENCOUNTER — Encounter: Payer: Self-pay | Admitting: Gastroenterology

## 2012-03-07 ENCOUNTER — Other Ambulatory Visit (INDEPENDENT_AMBULATORY_CARE_PROVIDER_SITE_OTHER): Payer: Medicare PPO

## 2012-03-07 ENCOUNTER — Other Ambulatory Visit: Payer: Self-pay | Admitting: Gastroenterology

## 2012-03-07 VITALS — BP 142/78 | HR 56 | Temp 98.6°F | Resp 18 | Ht 69.0 in | Wt 229.0 lb

## 2012-03-07 DIAGNOSIS — K6389 Other specified diseases of intestine: Secondary | ICD-10-CM

## 2012-03-07 DIAGNOSIS — D126 Benign neoplasm of colon, unspecified: Secondary | ICD-10-CM

## 2012-03-07 DIAGNOSIS — C187 Malignant neoplasm of sigmoid colon: Secondary | ICD-10-CM

## 2012-03-07 DIAGNOSIS — D649 Anemia, unspecified: Secondary | ICD-10-CM

## 2012-03-07 LAB — CBC WITH DIFFERENTIAL/PLATELET
Basophils Relative: 0.4 % (ref 0.0–3.0)
Eosinophils Absolute: 0 10*3/uL (ref 0.0–0.7)
Eosinophils Relative: 0.3 % (ref 0.0–5.0)
Lymphocytes Relative: 30.8 % (ref 12.0–46.0)
MCHC: 33.2 g/dL (ref 30.0–36.0)
Neutrophils Relative %: 62.1 % (ref 43.0–77.0)
Platelets: 237 10*3/uL (ref 150.0–400.0)
RBC: 4.08 Mil/uL (ref 3.87–5.11)
WBC: 6.2 10*3/uL (ref 4.5–10.5)

## 2012-03-07 LAB — COMPREHENSIVE METABOLIC PANEL
AST: 24 U/L (ref 0–37)
Albumin: 4 g/dL (ref 3.5–5.2)
Alkaline Phosphatase: 71 U/L (ref 39–117)
BUN: 15 mg/dL (ref 6–23)
Calcium: 8.8 mg/dL (ref 8.4–10.5)
Chloride: 102 mEq/L (ref 96–112)
Potassium: 3.6 mEq/L (ref 3.5–5.1)
Sodium: 139 mEq/L (ref 135–145)
Total Protein: 7.7 g/dL (ref 6.0–8.3)

## 2012-03-07 MED ORDER — SODIUM CHLORIDE 0.9 % IV SOLN: 500.0000 mL | INTRAVENOUS | Status: DC

## 2012-03-07 NOTE — Patient Instructions (Addendum)
Discharge instructions given with verbal understanding. Handout on polyp given. Lab work ordered per Dr. Arlyce Dice CT SCAN ordered and contrast given in recovery room. YOU HAD AN ENDOSCOPIC PROCEDURE TODAY AT THE [facility]: Refer to the procedure report that was given to you for any specific questions about what was found during the examination.  If the procedure report does not answer your questions, please call your gastroenterologist to clarify.  If you requested that your care partner not be given the details of your procedure findings, then the procedure report has been included in a sealed envelope for you to review at your convenience later.  YOU SHOULD EXPECT: Some feelings of bloating in the abdomen. Passage of more gas than usual.  Walking can help get rid of the air that was put into your GI tract during the procedure and reduce the bloating. If you had a lower endoscopy (such as a colonoscopy or flexible sigmoidoscopy) you may notice spotting of blood in your stool or on the toilet paper. If you underwent a bowel prep for your procedure, then you may not have a normal bowel movement for a few days.  DIET: Your first meal following the procedure should be a light meal and then it is ok to progress to your normal diet.  A half-sandwich or bowl of soup is an example of a good first meal.  Heavy or fried foods are harder to digest and may make you feel nauseous or bloated.  Likewise meals heavy in dairy and vegetables can cause extra gas to form and this can also increase the bloating.  Drink plenty of fluids but you should avoid alcoholic beverages for 24 hours.  ACTIVITY: Your care partner should take you home directly after the procedure.  You should plan to take it easy, moving slowly for the rest of the day.  You can resume normal activity the day after the procedure however you should NOT DRIVE or use heavy machinery for 24 hours (because of the sedation medicines used during the  test).    SYMPTOMS TO REPORT IMMEDIATELY: A gastroenterologist can be reached at any hour.  During normal business hours, 8:30 AM to 5:00 PM Monday through Friday, call [phone].  After hours and on weekends, please call the GI answering service at [phone] who will take a message and have the physician on call contact you.   Following lower endoscopy (colonoscopy or flexible sigmoidoscopy):  Excessive amounts of blood in the stool  Significant tenderness or worsening of abdominal pains  Swelling of the abdomen that is new, acute  Fever of 100F or higher FOLLOW UP: If any biopsies were taken you will be contacted by phone or by letter within the next 1-3 weeks.  Call your gastroenterologist if you have not heard about the biopsies in 3 weeks.  Our staff will call the home number listed on your records the next business day following your procedure to check on you and address any questions or concerns that you may have at that time regarding the information given to you following your procedure. This is a courtesy call and so if there is no answer at the home number and we have not heard from you through the emergency physician on call, we will assume that you have returned to your regular daily activities without incident.  SIGNATURES/CONFIDENTIALITY: You and/or your care partner have signed paperwork which will be entered into your electronic medical record.  These signatures attest to the fact  that that the information above on your After Visit Summary has been reviewed and is understood.  Full responsibility of the confidentiality of this discharge information lies with you and/or your care-partner.

## 2012-03-07 NOTE — Op Note (Signed)
[facility] [address] [place], [address]   COLONOSCOPY PROCEDURE REPORT  PATIENT: Lisa Osborne, Lisa Osborne  MR#: [id number] BIRTHDATE: 08/02/1940 , 71  yrs. old GENDER: Female ENDOSCOPIST: Louis Meckel, MD REFERRED WG:[id number] Gutierrez, M.D. PROCEDURE DATE:  03/07/2012 PROCEDURE:   Colonoscopy with biopsy and Colonoscopy with snare polypectomy ASA CLASS:   Class II INDICATIONS:Iron Deficiency Anemia. MEDICATIONS: MAC sedation, administered by CRNA and propofol (Diprivan) 300mg  IV  DESCRIPTION OF PROCEDURE:   After the risks benefits and alternatives of the procedure were thoroughly explained, informed consent was obtained.  A digital rectal exam revealed no abnormalities of the rectum.   The LB [id number]  endoscope was introduced through the anus and advanced to the cecum, which was identified by both the appendix and ileocecal valve. No adverse events experienced.   The quality of the prep was Suprep excellent The instrument was then slowly withdrawn as the colon was fully examined.      COLON FINDINGS: In the sigmoid colon at approximately 20 cm from the anus there is an exophytic, circumferential friable bleeding mass measuring approximately 3-4 cm in length.  Biopsies were taken. The submucosa was marked both proximal and distal to the lesion with 5 cc total of "spot".   A sessile polyp measuring 3 mm in size was found in the descending colon.  A polypectomy was performed with a cold snare.  The resection was complete and the polyp tissue was completely retrieved.   The colon mucosa was otherwise normal. Retroflexed views revealed no abnormalities. The time to cecum=2 minutes 0 seconds.  Withdrawal time=12 minutes 37 seconds.  The scope was withdrawn and the procedure completed. COMPLICATIONS: There were no complications.  ENDOSCOPIC IMPRESSION: 1.   malignant neoplasm, sigmoid colon 2.  colon polyp  RECOMMENDATIONS: 1.  CT of the  chest abdomen and pelvis 2.  surgical consult-Dr. Viviann Spare gross 3.  colonoscopy one year  eSigned:  Louis Meckel, MD 03/07/2012 3:21 PM   cc:

## 2012-03-07 NOTE — Progress Notes (Signed)
Called to room to assist during endoscopic procedure.  Patient ID and intended procedure confirmed with present staff. Received instructions for my participation in the procedure from the performing physician.  

## 2012-03-08 ENCOUNTER — Telehealth: Payer: Self-pay

## 2012-03-08 ENCOUNTER — Telehealth: Payer: Self-pay | Admitting: *Deleted

## 2012-03-08 NOTE — Telephone Encounter (Signed)
Spoke with pt and she is aware of appt date and time. Spoke with Rose at [facility] and pt is to call her back and let her know when she can come for her CT scan.

## 2012-03-08 NOTE — Telephone Encounter (Signed)
Message copied by Chrystie Nose on Wed Mar 08, 2012  9:14 AM ------      Message from: Marnette Burgess      Created: Tue Mar 07, 2012  4:10 PM      Regarding: RE: Appt       Patient is scheduled to see Dr. Karie Soda on 03/20/12 @ 8:45am, arrive at 8:15am.  If you have any questions please call [phone].            Thank You,      Elane Fritz      ----- Message -----         From: Lily Lovings, RN         Sent: 03/07/2012   3:36 PM           To: Marnette Burgess      Subject: Appt                                                     Elane Fritz,            This pt needs an appt to see Dr. Michaell Cowing. Mass was found in colon today. Pt having labs and will be scheduled for CT of chest/abdomen/and pelvis.            Thanks,      Selinda Michaels RN       ------

## 2012-03-08 NOTE — Telephone Encounter (Signed)
  Follow up Call-  Call back number 03/07/2012  Post procedure Call Back phone  # [phone]  Permission to leave phone message Yes     Patient questions:  Do you have a fever, pain , or abdominal swelling? no Pain Score  0 *  Have you tolerated food without any problems? yes  Have you been able to return to your normal activities? yes  Do you have any questions about your discharge instructions: Diet   no Medications  no Follow up visit  no  Do you have questions or concerns about your Care? no  Actions: * If pain score is 4 or above: No action needed, pain <4.

## 2012-03-14 ENCOUNTER — Encounter: Payer: Self-pay | Admitting: Family Medicine

## 2012-03-14 DIAGNOSIS — Z85038 Personal history of other malignant neoplasm of large intestine: Secondary | ICD-10-CM | POA: Insufficient documentation

## 2012-03-14 DIAGNOSIS — C189 Malignant neoplasm of colon, unspecified: Secondary | ICD-10-CM | POA: Insufficient documentation

## 2012-03-15 ENCOUNTER — Ambulatory Visit (INDEPENDENT_AMBULATORY_CARE_PROVIDER_SITE_OTHER)
Admission: RE | Admit: 2012-03-15 | Discharge: 2012-03-15 | Disposition: A | Discharge status: Home / Self Care | Payer: Medicare PPO | Source: Ambulatory Visit | Attending: Gastroenterology | Admitting: Gastroenterology

## 2012-03-15 DIAGNOSIS — K6389 Other specified diseases of intestine: Secondary | ICD-10-CM

## 2012-03-15 MED ORDER — IOHEXOL 300 MG/ML  SOLN
100.0000 mL | Freq: Once | INTRAMUSCULAR | Status: AC | PRN
  Administered 2012-03-15: 100 mL via INTRAVENOUS

## 2012-03-20 ENCOUNTER — Ambulatory Visit (INDEPENDENT_AMBULATORY_CARE_PROVIDER_SITE_OTHER): Payer: Medicare PPO | Admitting: Surgery

## 2012-03-20 ENCOUNTER — Encounter (INDEPENDENT_AMBULATORY_CARE_PROVIDER_SITE_OTHER): Payer: Self-pay | Admitting: Surgery

## 2012-03-20 VITALS — BP 132/80 | HR 78 | Temp 97.4°F | Ht 69.0 in | Wt 228.6 lb

## 2012-03-20 MED ORDER — METRONIDAZOLE 500 MG PO TABS: 500.0000 mg | ORAL_TABLET | ORAL | Status: DC

## 2012-03-20 MED ORDER — NEOMYCIN SULFATE 500 MG PO TABS: 1000.0000 mg | ORAL_TABLET | ORAL | Status: DC

## 2012-03-20 NOTE — Progress Notes (Signed)
Subjective:     Patient ID: Lisa Osborne, female   DOB: 1940/02/12, 72 y.o.   MRN: [id number]  HPI  Lisa Osborne  Aug 02, 1940 [id number]  Patient Care Team: Eustaquio Boyden, MD as PCP - General (Family Medicine) Ardeth Sportsman, MD as Consulting Physician (General Surgery)  This patient is a 72 y.o.female who presents today for surgical evaluation at the request of Dr. Arlyce Dice.   Reason for visit: Newly diagnosed colon cancer  Pleasant obese female.  Began to have worsening rectal bleeding.  Also intermittent crampy left lower quadrant abdominal pain.  Underwent colonoscopy.  Found to have small polyp in descending colon easily removed.  Had more friable bulky mass at rectosigmoid junction (20 cm) biopsy consistent with cancer.  She was sent in for surgical evaluation.    She usually has a bowel movement every day.  Some loose stools when she "binges on sugar".  Claims glucoses are under good control but cannot give me numbers.  No history of skin infections.  She can walk 1 mile without any difficulty.  No personal nor family history of GI/colon cancer, inflammatory bowel disease, irritable bowel syndrome, allergy such as Celiac Sprue, dietary/dairy problems, colitis, ulcers nor gastritis.  No recent sick contacts/gastroenteritis.  No travel outside the country.  No changes in diet.    Patient Active Problem List  Diagnosis  . IDA (iron deficiency anemia)  . Depression  . Diabetes mellitus type 2, controlled  . GERD (gastroesophageal reflux disease)  . Hypothyroidism  . Urge incontinence  . Blood in stool  . Medicare annual wellness visit, initial  . Colon adenocarcinoma - rectosigmoid     Past Medical History  Diagnosis Date  . History of iron deficiency anemia   . Arthritis     left side lower back pain  . History of chicken pox   . Depression     severe in past  . Diabetes mellitus type 2, controlled 2006    diet controlled  . GERD (gastroesophageal reflux  disease)     bad if off PPI  . Seasonal allergies   . Hypothyroidism     mild  . Urine incontinence     mild, wears pad  . Unilateral deafnesses     left ear deaf, wears hearing aid right side  . Rosacea   . Anemia   . Colon adenocarcinoma 2014    sigmoid    Past Surgical History  Procedure Laterality Date  . Total vaginal hysterectomy  1985    cancer cells on cervix  . Abdominoplasty  1996  . Incontinence surgery  1996    bladder sling  . Colonoscopy  08/2006    normal  . Dexa  08/06/2003    normal  . Facelift  05/2011  . Abdominal hysterectomy  1985    total    History   Social History  . Marital Status: Single    Spouse Name: N/A    Number of Children: 1  . Years of Education: N/A   Occupational History  . Retired Runner, broadcasting/film/video    Social History Main Topics  . Smoking status: Former Games developer  . Smokeless tobacco: Former Neurosurgeon    Quit date: 03/20/1966  . Alcohol Use: Yes     Comment: seldom  . Drug Use: No  . Sexually Active: Not on file   Other Topics Concern  . Not on file   Social History Narrative   Caffeine: 1 cup coffee/day   Lives alone.  1 daughter Victorino Dike), 3 stillborns   Occupation: retired, was Air traffic controller.   Activity: no regular activity but does park far away, has treadmill at home   Diet: good fruits/vegetables, more fruit/vegetable smoothies  good amt water    Family History  Problem Relation Age of Onset  . Breast cancer Mother 77    breast cancer then multiple myeloma  . Cancer Mother     breast  . Alcohol abuse Father   . Heart disease Father     heart failure  . Mental illness Brother     2 brothers severe depression, suicide x2  . Diabetes Maternal Aunt   . Diabetes Maternal Uncle   . Diabetes Paternal Uncle   . Diabetes Paternal Grandfather   . Coronary artery disease Neg Hx   . Stroke Neg Hx     Current Outpatient Prescriptions  Medication Sig Dispense Refill  . calcium gluconate 500 MG tablet Take 500 mg by mouth daily.         . cholecalciferol (VITAMIN D) 1000 UNITS tablet Take 1,000 Units by mouth daily.        . ferrous sulfate 325 (65 FE) MG tablet Take 325 mg by mouth daily with breakfast.      . lansoprazole (PREVACID) 15 MG capsule Take 15 mg by mouth daily.        Marland Kitchen levothyroxine (SYNTHROID, LEVOTHROID) 25 MCG tablet Take 1 tablet (25 mcg total) by mouth daily.  90 tablet  3  . Multiple Vitamin (MULTIVITAMIN) tablet Take 1 tablet by mouth daily.        . sertraline (ZOLOFT) 50 MG tablet Take 1 tablet (50 mg total) by mouth daily.  90 tablet  3  . vitamin B-12 (CYANOCOBALAMIN) 100 MCG tablet Take 50 mcg by mouth daily.      . [DISCONTINUED] oxybutynin (DITROPAN) 5 MG tablet Take 1 tablet (5 mg total) by mouth 3 (three) times daily as needed.  60 tablet  0   No current facility-administered medications for this visit.     Allergies  Allergen Reactions  . Latex     Mild reaction  . Nickel     Slight itch  . Shrimp (Shellfish Allergy)     Slight allergy - itching    BP 132/80  Pulse 78  Temp(Src) 97.4 F (36.3 C) (Temporal)  Ht 5\' 9"  (1.753 m)  Wt 228 lb 9.6 oz (103.692 kg)  BMI 33.74 kg/m2  SpO2 96%  Ct Chest W Contrast  03/15/2012  *RADIOLOGY REPORT*  Clinical Data:   Colon mass.  Abdominal cramping.  CT CHEST, ABDOMEN AND PELVIS WITH CONTRAST  Technique:  Multidetector CT imaging of the chest, abdomen and pelvis was performed following the standard protocol during bolus administration of intravenous contrast.  Contrast: OMNIPAQUE IOHEXOL 300 MG/ML  SOLN,  Comparison:   None.  CT CHEST  Findings:  No axillary axillary or supraclavicular adenopathy. There is no mediastinal or hilar adenopathy identified.  Heart size is normal.  There is no pericardial effusion.  No suspicious pulmonary nodule or mass.  Review of the visualized bony structures is unremarkable.  No worrisome lytic or sclerotic bone lesions.  IMPRESSION:  1.  No evidence for metastatic disease to the chest.  CT ABDOMEN AND  PELVIS  Findings:  There are fluid attenuation structures within the left hepatic lobe which likely represents cysts.  These measure up to 2.7 cm, image 45/series 2.  6 mm hypodensity in the right hepatic lobe  is too small to characterize, image 54.  The gallbladder appears normal.  Normal appearance of the pancreas.  The spleen is unremarkable.  The adrenal glands are both normal.  Kidneys are unremarkable. The kidneys are both unremarkable.  The urinary bladder appears normal. Previous hysterectomy.  There are no enlarged upper abdominal lymph nodes.  No iliac or inguinal adenopathy.  No ascites.  There is a moderate size hiatal hernia.  Stomach otherwise normal.  The small bowel loops are unremarkable.  The proximal colon appears normal. Circumferential masslike wall thickening is noted with a single wall thickness of 1.2 cm, image 109.  There is an enlarged lymph node adjacent to the sigmoid colon which  1.4 cm, image 105/series 2.  No evidence for bowel obstruction.  Review of the visualized osseous structures is significant for lumbar degenerative disc disease.  IMPRESSION:  1.  Mass circumferential mass involving the sigmoid colon is identified compatible with primary colon carcinoma.  There is no evidence for bowel obstruction at this time. 2.  Enlarged lymph node adjacent to sigmoid colon is worrisome for metastatic adenopathy. 3.  No specific features identified to suggest distant metastatic disease.   Original Report Authenticated By: Signa Kell, M.D.    Ct Abdomen Pelvis W Contrast  03/15/2012  *RADIOLOGY REPORT*  Clinical Data:   Colon mass.  Abdominal cramping.  CT CHEST, ABDOMEN AND PELVIS WITH CONTRAST  Technique:  Multidetector CT imaging of the chest, abdomen and pelvis was performed following the standard protocol during bolus administration of intravenous contrast.  Contrast: OMNIPAQUE IOHEXOL 300 MG/ML  SOLN,  Comparison:   None.  CT CHEST  Findings:  No axillary axillary or  supraclavicular adenopathy. There is no mediastinal or hilar adenopathy identified.  Heart size is normal.  There is no pericardial effusion.  No suspicious pulmonary nodule or mass.  Review of the visualized bony structures is unremarkable.  No worrisome lytic or sclerotic bone lesions.  IMPRESSION:  1.  No evidence for metastatic disease to the chest.  CT ABDOMEN AND PELVIS  Findings:  There are fluid attenuation structures within the left hepatic lobe which likely represents cysts.  These measure up to 2.7 cm, image 45/series 2.  6 mm hypodensity in the right hepatic lobe is too small to characterize, image 54.  The gallbladder appears normal.  Normal appearance of the pancreas.  The spleen is unremarkable.  The adrenal glands are both normal.  Kidneys are unremarkable. The kidneys are both unremarkable.  The urinary bladder appears normal. Previous hysterectomy.  There are no enlarged upper abdominal lymph nodes.  No iliac or inguinal adenopathy.  No ascites.  There is a moderate size hiatal hernia.  Stomach otherwise normal.  The small bowel loops are unremarkable.  The proximal colon appears normal. Circumferential masslike wall thickening is noted with a single wall thickness of 1.2 cm, image 109.  There is an enlarged lymph node adjacent to the sigmoid colon which  1.4 cm, image 105/series 2.  No evidence for bowel obstruction.  Review of the visualized osseous structures is significant for lumbar degenerative disc disease.  IMPRESSION:  1.  Mass circumferential mass involving the sigmoid colon is identified compatible with primary colon carcinoma.  There is no evidence for bowel obstruction at this time. 2.  Enlarged lymph node adjacent to sigmoid colon is worrisome for metastatic adenopathy. 3.  No specific features identified to suggest distant metastatic disease.   Original Report Authenticated By: Signa Kell, M.D.  Review of Systems  Constitutional: Negative for fever, chills, diaphoresis,  appetite change and fatigue.  HENT: Negative for ear pain, sore throat, trouble swallowing, neck pain and ear discharge.   Eyes: Negative for photophobia, discharge and visual disturbance.  Respiratory: Negative for cough, choking, chest tightness and shortness of breath.   Cardiovascular: Negative for chest pain and palpitations.  Gastrointestinal: Negative for nausea, vomiting, abdominal pain, diarrhea, constipation, anal bleeding and rectal pain.  Genitourinary: Negative for dysuria, frequency and difficulty urinating.  Musculoskeletal: Negative for myalgias and gait problem.  Skin: Negative for color change, pallor and rash.  Neurological: Negative for dizziness, speech difficulty, weakness and numbness.  Hematological: Negative for adenopathy.  Psychiatric/Behavioral: Negative for confusion and agitation. The patient is not nervous/anxious.        Objective:   Physical Exam  Constitutional: She is oriented to person, place, and time. She appears well-developed and well-nourished. No distress.  HENT:  Head: Normocephalic.  Mouth/Throat: Oropharynx is clear and moist. No oropharyngeal exudate.  Eyes: Conjunctivae and EOM are normal. Pupils are equal, round, and reactive to light. No scleral icterus.  Neck: Normal range of motion. Neck supple. No tracheal deviation present.  Cardiovascular: Normal rate, regular rhythm and intact distal pulses.   Pulmonary/Chest: Effort normal and breath sounds normal. No respiratory distress. She exhibits no tenderness.  Abdominal: Soft. She exhibits no distension and no mass. There is no tenderness. There is no rigidity, no guarding, no CVA tenderness, no tenderness at McBurney's point and negative Murphy's sign. No hernia. Hernia confirmed negative in the ventral area, confirmed negative in the right inguinal area and confirmed negative in the left inguinal area.    Obese but soft.  No hernias.  Genitourinary: No vaginal discharge found.    Musculoskeletal: Normal range of motion. She exhibits no tenderness.       Lumbar back: She exhibits normal range of motion, no bony tenderness and no deformity.  Mild lower back paraspinal soreness.  No trigger point.  Lymphadenopathy:    She has no cervical adenopathy.       Right: No inguinal adenopathy present.       Left: No inguinal adenopathy present.  Neurological: She is alert and oriented to person, place, and time. No cranial nerve deficit. She exhibits normal muscle tone. Coordination normal.  Skin: Skin is warm and dry. No rash noted. She is not diaphoretic. No erythema.  Psychiatric: She has a normal mood and affect. Her behavior is normal. Judgment and thought content normal.   CEA = 6.5    Assessment:     Newly diagnosed cancer of rectosigmoid junction.     Plan:     I think she would benefit from segmental resection of colon cancer at the rectosigmoid junction.  Probably will need a low anterior with an EEA anastomosis.  I did discuss procedure with her at length:  The anatomy & physiology of the digestive tract was discussed.  The pathophysiology was discussed.  Natural history risks without surgery was discussed.   I worked to give an overview of the disease and the frequent need to have multispecialty involvement.  I feel the risks of no intervention will lead to serious problems that outweigh the operative risks; therefore, I recommended a partial proctocolectomy to remove the pathology.  Laparoscopic & open techniques were discussed.  We will work to preserve anal & pelvic floor function without sacrificing cure.  Risks such as bleeding, infection, abscess, leak, reoperation, possible ostomy, hernia, heart attack,  death, and other risks were discussed.  I noted a good likelihood this will help address the problem.   Goals of post-operative recovery were discussed as well.  We will work to minimize complications.  An educational handout on the pathology was given as  well.  Questions were answered.    The patient expresses understanding & wishes to proceed with surgery.

## 2012-03-20 NOTE — Patient Instructions (Signed)
COLON PREP INSTRUCTIONS for lower colectomy:   Obtain what you need at a pharmacy of your choice:      Prescriptions for your oral antibiotics (Neomycin & Metronidazole)     A bottle of Milk of Magnesia    2 Fleet enemas (generic form OK to use)   DAY PRIOR TO SURGERY:    1:00pm    o Take 2 oz (4 tablespoons) Milk of Magnesia. o Take 2 Neomycin 500mg  tablets & 2 Metronidazole 500mg  tablets     3:00pm:    o Take 2 Neomycin 500mg  tablets & 2 Metronidazole 500mg  tablets     Bedtime (~10:00pm)  o Take 2 Neomycin 500mg  tablets & 2 Metronidazole 500mg  tablets    Midnight:  Do not eat or drink anything after midnight the night before your surgery.   MORNING OF PROCEDURE:    Remember to not to drink or eat anything that morning    Upon waking up, take the 2 Fleet enemas.  o Use at least 1 hour before leaving house o Try to retain each enema for 5-10 minutes before expelling it.  This should clean your lower colon sufficiently   If you have questions or problems, please call [facility] [phone] speak to someone in the clinic department at our office    Colorectal Cancer Colorectal cancer is an abnormal growth of tissue (tumor) in the colon or rectum that is cancerous (malignant). Unlike noncancerous (benign) tumors, malignant tumors can spread to other parts of your body. The colon is the large bowel or large intestine. The rectum is the last several inches of the colon. CAUSES  The exact cause of colon cancer is unknown.  RISK FACTORS The majority of patients do not have identifiable risk factors, but the following factors may increase your chances of getting colon cancer:  Age. Most colorectal cancers occur in people older than 50 years.  Having abnormal growths (polyps) on the inner wall of the colon or rectum.  Diabetes.  Being African American.  Family history of hereditary nonpolyposis colon cancer. This condition is caused by changes in the  genes that are responsible for repairing mismatched DNA.  Family history of familial adenomatous polyposis (FAP). This is a rare, inherited condition in which hundreds of polyps form in the colon and rectum. It is caused by a change in the APC gene. Unless FAP is treated, it usually leads to colorectal cancer by age 27.  Personal history of cancer. A person who has already had colorectal cancer may develop it a second time. Also, women with a history of ovarian, uterine, or breast cancer are at a somewhat higher risk of developing colorectal cancer.  Inflammatory bowel disease, including ulcerative colitis and Crohn's disease.  Being obese or eating a diet that is high in fat (especially animal fat) and low in fiber, fruits, and vegetables.  Smoking. A person who smokes cigarettes may be at increased risk of developing polyps and colorectal cancer.  Heavy alcohol use. SYMPTOMS Early colorectal cancer often does not cause symptoms. As the cancer grows, symptoms may include:  Diarrhea.  Constipation.  Feeling like the bowel does not empty completely after a bowel movement.  Blood in the stool.  Stools that are narrower than usual.  Abdominal discomfort, pain, bloating, fullness, or cramps.  Unexplained weight loss.  Constant tiredness.  Nausea and vomiting. DIAGNOSIS  Your caregiver will ask about your medical history. He or she may also perform a number of procedures, such as:  A physical exam.  Blood tests. This may include a routine complete blood count and iron level testing. Your caregiver may also check for carcinoembryonic antigen (CEA) and other substances in the blood. Some people who have colorectal cancer have a high CEA level. These levels may be used to follow the activity of your colon cancer.  Chest X-rays, computed tomography (CT) scans, or magnetic resonance imaging (MRI).  Taking a tissue sample (biopsy) from the colon or rectum. The sample is examined under  a microscope to look for cancer cells.  Sigmoidoscopy. With this test, your caregiver can see inside your colon. A thin flexible tube (sigmoidoscope) is placed into your rectum. This device has a light source and a tiny video camera in it. Your caregiver uses the sigmoidoscope to look at the last third of your colon.  Colonoscopy. This test is like sigmoidoscopy, but your caregiver looks at the entire colon. This test usually requires medicine that helps you relax (sedative).  Endorectal ultrasound. With this test, your caregiver can see how deep a rectal tumor has grown and whether the cancer has spread to lymph nodes or other nearby tissues. A tool (probe) is inserted into the rectum. The probe sends out sound waves to the rectum and nearby tissues, and a computer uses the echoes to create a picture. Your cancer will be staged to determine its severity and extent. Staging is a careful attempt to find out the size of the tumor, whether the cancer has spread, and if so, to what parts of the body. You may need to have more tests to determine the stage of your cancer. The test results will help determine what treatment plan is best for you. STAGES  Stage 0. The cancer is found only in the innermost lining of the colon or rectum.  Stage I. The cancer has grown into the inner wall of the colon or rectum. The cancer has not yet reached the outer wall of the colon.  Stage II. The cancer extends more deeply into or through the wall of the colon or rectum. It may have invaded nearby tissue, but cancer cells have not spread to the lymph nodes.  Stage III. The cancer has spread to nearby lymph nodes but not to other parts of the body.  Stage IV. The cancer has spread to other parts of the body, such as the liver or lungs. Your caregiver may tell you the detailed stage of your cancer. In that case, the stage will include both a number and a letter. TREATMENT  Depending on the type and stage, colorectal  cancer may be treated with surgery, radiation therapy, or chemotherapy. Some patients have a combination of these therapies.  Surgery may be done to remove the polyps from your colon. In early stages, your caregiver may be able to do this during a colonoscopy. In later stages, surgery may be done to remove part of your colon.  Radiation therapy uses high-energy rays to kill cancer cells. This is usually recommended for patients with rectal cancer.  Chemotherapy is the use of drugs to kill cancer cells. Caregivers also give chemotherapy to help reduce pain and other problems caused by colorectal cancer. This may be done even if the cancer is not curable. HOME CARE INSTRUCTIONS   Only take over-the-counter or prescription medicines for pain, discomfort, or fever as directed by your caregiver.  Maintain a healthy diet.  Consider joining a support group. This may help you learn to cope with the stress of  having colorectal cancer.  Seek advice to help you manage treatment side effects.  Keep all follow-up appointments as directed by your caregiver.  Inform your cancer specialist if you are admitted to the hospital. SEEK IMMEDIATE MEDICAL CARE IF:   Your diarrhea or constipation does not go away.  You have alternating constipation and diarrhea.  You have blood in your stools.  Your abdominal pain gets worse.  You lose weight without trying.  You notice new fatigue or weakness.  You develop a fever during chemotherapy treatment. Document Released: 01/11/2005 Document Revised: 04/05/2011 Document Reviewed: 12/29/2010 [facility] Patient Information 2013 [place], [place].  ABDOMINAL SURGERY: POST OP INSTRUCTIONS  1. DIET: Follow a light bland diet the first 24 hours after arrival home, such as soup, liquids, crackers, etc.  Be sure to include lots of fluids daily.  Avoid fast food or heavy meals as your are more likely to get nauseated.  Eat a low fat the next few days after surgery.    2. Take your usually prescribed home medications unless otherwise directed. 3. PAIN CONTROL: a. Pain is best controlled by a usual combination of three different methods TOGETHER: i. Ice/Heat ii. Over the counter pain medication iii. Prescription pain medication b. Most patients will experience some swelling and bruising around the incisions.  Ice packs or heating pads (30-60 minutes up to 6 times a day) will help. Use ice for the first few days to help decrease swelling and bruising, then switch to heat to help relax tight/sore spots and speed recovery.  Some people prefer to use ice alone, heat alone, alternating between ice & heat.  Experiment to what works for you.  Swelling and bruising can take several weeks to resolve.   c. It is helpful to take an over-the-counter pain medication regularly for the first few weeks.  Choose one of the following that works best for you: i. Naproxen (Aleve, etc)  Two 220mg  tabs twice a day ii. Ibuprofen (Advil, etc) Three 200mg  tabs four times a day (every meal & bedtime) iii. Acetaminophen (Tylenol, etc) 500-650mg  four times a day (every meal & bedtime) d. A  prescription for pain medication (such as oxycodone, hydrocodone, etc) should be given to you upon discharge.  Take your pain medication as prescribed.  i. If you are having problems/concerns with the prescription medicine (does not control pain, nausea, vomiting, rash, itching, etc), please call us [phone] to see if we need to switch you to a different pain medicine that will work better for you and/or control your side effect better. ii. If you need a refill on your pain medication, please contact your pharmacy.  They will contact our office to request authorization. Prescriptions will not be filled after 5 pm or on week-ends. 4. Avoid getting constipated.  Between the surgery and the pain medications, it is common to experience some constipation.  Increasing fluid intake and taking a fiber  supplement (such as Metamucil, Citrucel, FiberCon, MiraLax, etc) 1-2 times a day regularly will usually help prevent this problem from occurring.  A mild laxative (prune juice, Milk of Magnesia, MiraLax, etc) should be taken according to package directions if there are no bowel movements after 48 hours.   5. Watch out for diarrhea.  If you have many loose bowel movements, simplify your diet to bland foods & liquids for a few days.  Stop any stool softeners and decrease your fiber supplement.  Switching to mild anti-diarrheal medications (Kayopectate, Pepto Bismol) can help.  If this  worsens or does not improve, please call us. 6. Wash / shower every day.  You may shower over the incision / wound.  Avoid baths until the skin is fully healed.  Continue to shower over incision(s) after the dressing is off. 7. Remove your waterproof bandages 5 days after surgery.  You may leave the incision open to air.  You may replace a dressing/Band-Aid to cover the incision for comfort if you wish. 8. ACTIVITIES as tolerated:   a. You may resume regular (light) daily activities beginning the next day-such as daily self-care, walking, climbing stairs-gradually increasing activities as tolerated.  If you can walk 30 minutes without difficulty, it is safe to try more intense activity such as jogging, treadmill, bicycling, low-impact aerobics, swimming, etc. b. Save the most intensive and strenuous activity for last such as sit-ups, heavy lifting, contact sports, etc  Refrain from any heavy lifting or straining until you are off narcotics for pain control.   c. DO NOT PUSH THROUGH PAIN.  Let pain be your guide: If it hurts to do something, don't do it.  Pain is your body warning you to avoid that activity for another week until the pain goes down. d. You may drive when you are no longer taking prescription pain medication, you can comfortably wear a seatbelt, and you can safely maneuver your car and apply brakes. e. Bonita Quin may  have sexual intercourse when it is comfortable.  9. FOLLOW UP in our office a. Please call [facility] at [phone] to set up an appointment to see your surgeon in the office for a follow-up appointment approximately 1-2 weeks after your surgery. b. Make sure that you call for this appointment the day you arrive home to insure a convenient appointment time. 10. IF YOU HAVE DISABILITY OR FAMILY LEAVE FORMS, BRING THEM TO THE OFFICE FOR PROCESSING.  DO NOT GIVE THEM TO YOUR DOCTOR.   WHEN TO CALL us [phone]: 1. Poor pain control 2. Reactions / problems with new medications (rash/itching, nausea, etc)  3. Fever over 101.5 F (38.5 C) 4. Inability to urinate 5. Nausea and/or vomiting 6. Worsening swelling or bruising 7. Continued bleeding from incision. 8. Increased pain, redness, or drainage from the incision  The clinic staff is available to answer your questions during regular business hours (8:30am-5pm).  Please don't hesitate to call and ask to speak to one of our nurses for clinical concerns.   A surgeon from [facility] is always on call at the hospitals   If you have a medical emergency, go to the nearest emergency room or call 911.    [facility] [address], [address], [place], [place]  [address] ? MAIN: [phone] ? TOLL FREE: [phone] ? FAX [phone] [email or web address]

## 2012-03-29 ENCOUNTER — Encounter (HOSPITAL_COMMUNITY): Payer: Self-pay | Admitting: Pharmacy Technician

## 2012-04-05 ENCOUNTER — Encounter (HOSPITAL_COMMUNITY): Payer: Self-pay

## 2012-04-05 ENCOUNTER — Other Ambulatory Visit: Payer: Self-pay

## 2012-04-05 ENCOUNTER — Encounter (HOSPITAL_COMMUNITY)
Admission: RE | Admit: 2012-04-05 | Discharge: 2012-04-05 | Disposition: A | Discharge status: Home / Self Care | Payer: Medicare PPO | Source: Ambulatory Visit | Attending: Surgery | Admitting: Surgery

## 2012-04-05 ENCOUNTER — Other Ambulatory Visit (HOSPITAL_COMMUNITY): Payer: Self-pay | Admitting: *Deleted

## 2012-04-05 DIAGNOSIS — C19 Malignant neoplasm of rectosigmoid junction: Secondary | ICD-10-CM | POA: Insufficient documentation

## 2012-04-05 DIAGNOSIS — Z01810 Encounter for preprocedural cardiovascular examination: Secondary | ICD-10-CM | POA: Insufficient documentation

## 2012-04-05 LAB — BASIC METABOLIC PANEL
BUN: 16 mg/dL (ref 6–23)
CO2: 25 mEq/L (ref 19–32)
Calcium: 8.8 mg/dL (ref 8.4–10.5)
Creatinine, Ser: 0.96 mg/dL (ref 0.50–1.10)
GFR calc Af Amer: 68 mL/min — ABNORMAL LOW (ref >90)

## 2012-04-05 LAB — CBC
MCHC: 32.4 g/dL (ref 30.0–36.0)
MCV: 89.6 fL (ref 78.0–100.0)
Platelets: 228 10*3/uL (ref 150–400)
RDW: 15.6 % — ABNORMAL HIGH (ref 11.5–15.5)
WBC: 5.9 10*3/uL (ref 4.0–10.5)

## 2012-04-05 NOTE — Patient Instructions (Addendum)
Devlin Mcveigh  04/05/2012                           YOUR PROCEDURE IS SCHEDULED ON: 04/14/12               PLEASE REPORT TO SHORT STAY CENTER AT :  5:15 AM               CALL THIS NUMBER IF ANY PROBLEMS THE DAY OF SURGERY :               [phone]                      REMEMBER:   Do not eat food or drink liquids AFTER MIDNIGHT    Take these medicines the morning of surgery with A SIP OF WATER:  ZOLOFT / LEVOTHROXINE / PREVACID   Do not wear jewelry, make-up   Do not wear lotions, powders, or perfumes.   Do not shave legs or underarms 12 hrs. before surgery (men may shave face)  Do not bring valuables to the hospital.  Contacts, dentures or bridgework may not be worn into surgery.  Leave suitcase in the car. After surgery it may be brought to your room.  For patients admitted to the hospital more than one night, checkout time is 11:00                          The day of discharge.   Patients discharged the day of surgery will not be allowed to drive home                             If going home same day of surgery, must have someone stay with you first                           24 hrs at home and arrange for some one to drive you home from hospital.    Special Instructions:   Please read over the following fact sheets that you were given:               1. MRSA  INFORMATION                      2. [place] PREPARING FOR SURGERY SHEET               3. STOP ASPIRIN AND HERBAL PRODUCTS 5 DAYS PREOP               4. FOLLOW BOWEL PREP FROM OFFICE                                                X_____________________________________________________________________        Failure to follow these instructions may result in cancellation of your surgery

## 2012-04-13 NOTE — Anesthesia Preprocedure Evaluation (Addendum)
Anesthesia Evaluation  Patient identified by MRN, date of birth, ID band Patient awake    Reviewed: Allergy & Precautions, H&P , NPO status , Patient's Chart, lab work & pertinent test results  Airway Mallampati: II TM Distance: >3 FB Neck ROM: Full    Dental no notable dental hx.    Pulmonary neg pulmonary ROS,  breath sounds clear to auscultation  Pulmonary exam normal       Cardiovascular Exercise Tolerance: Good negative cardio ROS  Rhythm:Regular Rate:Normal  ECG iRBBB ? LVH  Chest CT reviewed.   Neuro/Psych PSYCHIATRIC DISORDERS Depression negative neurological ROS     GI/Hepatic Neg liver ROS, GERD-  Medicated,  Endo/Other  diabetes, Type 2Hypothyroidism   Renal/GU negative Renal ROS  negative genitourinary   Musculoskeletal negative musculoskeletal ROS (+)   Abdominal (+) + obese,   Peds negative pediatric ROS (+)  Hematology negative hematology ROS (+)   Anesthesia Other Findings   Reproductive/Obstetrics negative OB ROS                          Anesthesia Physical Anesthesia Plan  ASA: II  Anesthesia Plan: General   Post-op Pain Management:    Induction: Intravenous  Airway Management Planned: Oral ETT  Additional Equipment:   Intra-op Plan:   Post-operative Plan: Extubation in OR  Informed Consent: I have reviewed the patients History and Physical, chart, labs and discussed the procedure including the risks, benefits and alternatives for the proposed anesthesia with the patient or authorized representative who has indicated his/her understanding and acceptance.   Dental advisory given  Plan Discussed with: CRNA  Anesthesia Plan Comments:         Anesthesia Quick Evaluation

## 2012-04-14 ENCOUNTER — Inpatient Hospital Stay (HOSPITAL_COMMUNITY)
Admission: RE | Admit: 2012-04-14 | Discharge: 2012-04-17 | DRG: 334 | Disposition: A | Discharge status: Home / Self Care | Payer: Medicare PPO | Source: Ambulatory Visit | Attending: Surgery | Admitting: Surgery

## 2012-04-14 ENCOUNTER — Encounter (HOSPITAL_COMMUNITY): Payer: Self-pay | Admitting: Anesthesiology

## 2012-04-14 ENCOUNTER — Encounter (HOSPITAL_COMMUNITY): Payer: Self-pay | Admitting: *Deleted

## 2012-04-14 ENCOUNTER — Encounter (HOSPITAL_COMMUNITY)
Admission: RE | Disposition: A | Discharge status: Home / Self Care | Payer: Self-pay | Source: Ambulatory Visit | Attending: Surgery

## 2012-04-14 ENCOUNTER — Inpatient Hospital Stay (HOSPITAL_COMMUNITY): Payer: Medicare PPO | Admitting: Anesthesiology

## 2012-04-14 DIAGNOSIS — C189 Malignant neoplasm of colon, unspecified: Secondary | ICD-10-CM | POA: Diagnosis present

## 2012-04-14 DIAGNOSIS — E1169 Type 2 diabetes mellitus with other specified complication: Secondary | ICD-10-CM | POA: Diagnosis present

## 2012-04-14 DIAGNOSIS — Z6833 Body mass index (BMI) 33.0-33.9, adult: Secondary | ICD-10-CM

## 2012-04-14 DIAGNOSIS — E669 Obesity, unspecified: Secondary | ICD-10-CM | POA: Diagnosis present

## 2012-04-14 DIAGNOSIS — R7303 Prediabetes: Secondary | ICD-10-CM | POA: Diagnosis present

## 2012-04-14 DIAGNOSIS — E119 Type 2 diabetes mellitus without complications: Secondary | ICD-10-CM | POA: Diagnosis present

## 2012-04-14 DIAGNOSIS — F3289 Other specified depressive episodes: Secondary | ICD-10-CM | POA: Diagnosis present

## 2012-04-14 DIAGNOSIS — Z85038 Personal history of other malignant neoplasm of large intestine: Secondary | ICD-10-CM | POA: Diagnosis present

## 2012-04-14 DIAGNOSIS — C19 Malignant neoplasm of rectosigmoid junction: Principal | ICD-10-CM | POA: Diagnosis present

## 2012-04-14 DIAGNOSIS — Z87891 Personal history of nicotine dependence: Secondary | ICD-10-CM

## 2012-04-14 DIAGNOSIS — Z90710 Acquired absence of both cervix and uterus: Secondary | ICD-10-CM

## 2012-04-14 DIAGNOSIS — K219 Gastro-esophageal reflux disease without esophagitis: Secondary | ICD-10-CM | POA: Diagnosis present

## 2012-04-14 DIAGNOSIS — D509 Iron deficiency anemia, unspecified: Secondary | ICD-10-CM | POA: Diagnosis present

## 2012-04-14 DIAGNOSIS — Z79899 Other long term (current) drug therapy: Secondary | ICD-10-CM

## 2012-04-14 DIAGNOSIS — F329 Major depressive disorder, single episode, unspecified: Secondary | ICD-10-CM | POA: Diagnosis present

## 2012-04-14 DIAGNOSIS — E039 Hypothyroidism, unspecified: Secondary | ICD-10-CM | POA: Diagnosis present

## 2012-04-14 HISTORY — PX: LAPAROSCOPIC LOW ANTERIOR RESECTION: SHX5904

## 2012-04-14 HISTORY — DX: Malignant neoplasm of colon, unspecified: C18.9

## 2012-04-14 LAB — TYPE AND SCREEN: ABO/RH(D): O POS

## 2012-04-14 LAB — GLUCOSE, CAPILLARY
Glucose-Capillary: 128 mg/dL — ABNORMAL HIGH (ref 70–99)
Glucose-Capillary: 180 mg/dL — ABNORMAL HIGH (ref 70–99)

## 2012-04-14 SURGERY — RESECTION, RECTUM, LOW ANTERIOR, LAPAROSCOPIC
Anesthesia: General | Wound class: Contaminated

## 2012-04-14 MED ORDER — ALUM & MAG HYDROXIDE-SIMETH 200-200-20 MG/5ML PO SUSP: 30.0000 mL | Freq: Four times a day (QID) | ORAL | Status: DC | PRN

## 2012-04-14 MED ORDER — BUPIVACAINE 0.25 % ON-Q PUMP DUAL CATH 300 ML
300.0000 mL | INJECTION | Status: DC
  Filled 2012-04-14: qty 300

## 2012-04-14 MED ORDER — KETOROLAC TROMETHAMINE 30 MG/ML IJ SOLN
INTRAMUSCULAR | Status: DC | PRN
  Administered 2012-04-14: 30 mg via INTRAVENOUS

## 2012-04-14 MED ORDER — DIPHENHYDRAMINE HCL 12.5 MG/5ML PO ELIX: 12.5000 mg | ORAL_SOLUTION | Freq: Four times a day (QID) | ORAL | Status: DC | PRN

## 2012-04-14 MED ORDER — RINGERS IRRIGATION IR SOLN
Status: DC | PRN
  Administered 2012-04-14: 1

## 2012-04-14 MED ORDER — HYDROMORPHONE HCL PF 1 MG/ML IJ SOLN
INTRAMUSCULAR | Status: AC
  Filled 2012-04-14: qty 1

## 2012-04-14 MED ORDER — PROMETHAZINE HCL 25 MG/ML IJ SOLN: 6.2500 mg | INTRAMUSCULAR | Status: DC | PRN

## 2012-04-14 MED ORDER — PROPOFOL 10 MG/ML IV BOLUS
INTRAVENOUS | Status: DC | PRN
  Administered 2012-04-14: 180 mg via INTRAVENOUS

## 2012-04-14 MED ORDER — OXYCODONE HCL 5 MG PO TABS: 5.0000 mg | ORAL_TABLET | ORAL | Status: DC | PRN

## 2012-04-14 MED ORDER — MEPERIDINE HCL 50 MG/ML IJ SOLN
12.5000 mg | Freq: Once | INTRAMUSCULAR | Status: AC
  Administered 2012-04-14: 12.5 mg via INTRAVENOUS

## 2012-04-14 MED ORDER — MIDAZOLAM HCL 5 MG/5ML IJ SOLN
INTRAMUSCULAR | Status: DC | PRN
  Administered 2012-04-14: 2 mg via INTRAVENOUS

## 2012-04-14 MED ORDER — NALOXONE HCL 0.4 MG/ML IJ SOLN
INTRAMUSCULAR | Status: DC | PRN
  Administered 2012-04-14 (×2): 40 ug via INTRAVENOUS

## 2012-04-14 MED ORDER — ALVIMOPAN 12 MG PO CAPS
12.0000 mg | ORAL_CAPSULE | Freq: Once | ORAL | Status: AC
  Administered 2012-04-14: 12 mg via ORAL
  Filled 2012-04-14: qty 1

## 2012-04-14 MED ORDER — HYDROMORPHONE HCL PF 1 MG/ML IJ SOLN
0.2500 mg | INTRAMUSCULAR | Status: DC | PRN
  Administered 2012-04-14 (×2): 0.5 mg via INTRAVENOUS

## 2012-04-14 MED ORDER — DIPHENHYDRAMINE HCL 50 MG/ML IJ SOLN: 12.5000 mg | Freq: Four times a day (QID) | INTRAMUSCULAR | Status: DC | PRN

## 2012-04-14 MED ORDER — MAGIC MOUTHWASH
15.0000 mL | Freq: Four times a day (QID) | ORAL | Status: DC | PRN
  Filled 2012-04-14: qty 15

## 2012-04-14 MED ORDER — GLYCOPYRROLATE 0.2 MG/ML IJ SOLN
INTRAMUSCULAR | Status: DC | PRN
  Administered 2012-04-14: .6 mg via INTRAVENOUS

## 2012-04-14 MED ORDER — BUPIVACAINE-EPINEPHRINE 0.25% -1:200000 IJ SOLN
INTRAMUSCULAR | Status: AC
  Filled 2012-04-14: qty 1

## 2012-04-14 MED ORDER — LIP MEDEX EX OINT
1.0000 "application " | TOPICAL_OINTMENT | Freq: Two times a day (BID) | CUTANEOUS | Status: DC
  Administered 2012-04-14 – 2012-04-17 (×6): 1 via TOPICAL
  Filled 2012-04-14: qty 7

## 2012-04-14 MED ORDER — BUPIVACAINE 0.25 % ON-Q PUMP DUAL CATH 300 ML
INJECTION | Status: DC | PRN
  Administered 2012-04-14: 300 mL

## 2012-04-14 MED ORDER — HYDROMORPHONE HCL PF 1 MG/ML IJ SOLN
0.5000 mg | INTRAMUSCULAR | Status: DC | PRN
  Administered 2012-04-14 – 2012-04-15 (×4): 1 mg via INTRAVENOUS
  Filled 2012-04-14 (×4): qty 1

## 2012-04-14 MED ORDER — CALCIUM GLUCONATE 500 MG PO TABS
500.0000 mg | ORAL_TABLET | Freq: Every day | ORAL | Status: DC
  Administered 2012-04-15 – 2012-04-17 (×3): 500 mg via ORAL
  Filled 2012-04-14 (×4): qty 1

## 2012-04-14 MED ORDER — ONDANSETRON HCL 4 MG/2ML IJ SOLN
INTRAMUSCULAR | Status: DC | PRN
  Administered 2012-04-14: 4 mg via INTRAVENOUS

## 2012-04-14 MED ORDER — ACETAMINOPHEN 500 MG PO TABS
1000.0000 mg | ORAL_TABLET | Freq: Three times a day (TID) | ORAL | Status: DC
  Administered 2012-04-14 – 2012-04-17 (×5): 1000 mg via ORAL
  Filled 2012-04-14 (×10): qty 2

## 2012-04-14 MED ORDER — SODIUM CHLORIDE 0.9 % IV SOLN
INTRAVENOUS | Status: DC
  Filled 2012-04-14: qty 6

## 2012-04-14 MED ORDER — MEPERIDINE HCL 50 MG/ML IJ SOLN
INTRAMUSCULAR | Status: AC
  Filled 2012-04-14: qty 1

## 2012-04-14 MED ORDER — BUPIVACAINE-EPINEPHRINE 0.25% -1:200000 IJ SOLN
INTRAMUSCULAR | Status: DC | PRN
  Administered 2012-04-14: 50 mL

## 2012-04-14 MED ORDER — SACCHAROMYCES BOULARDII 250 MG PO CAPS
250.0000 mg | ORAL_CAPSULE | Freq: Two times a day (BID) | ORAL | Status: DC
  Administered 2012-04-14 – 2012-04-17 (×6): 250 mg via ORAL
  Filled 2012-04-14 (×8): qty 1

## 2012-04-14 MED ORDER — ZOLPIDEM TARTRATE 5 MG PO TABS
5.0000 mg | ORAL_TABLET | Freq: Every evening | ORAL | Status: DC | PRN
  Administered 2012-04-15 – 2012-04-16 (×2): 5 mg via ORAL
  Filled 2012-04-14 (×2): qty 1

## 2012-04-14 MED ORDER — VITAMIN B-12 100 MCG PO TABS
100.0000 ug | ORAL_TABLET | Freq: Every day | ORAL | Status: DC
  Administered 2012-04-15 – 2012-04-17 (×3): 100 ug via ORAL
  Filled 2012-04-14 (×4): qty 1

## 2012-04-14 MED ORDER — HEPARIN SODIUM (PORCINE) 5000 UNIT/ML IJ SOLN
5000.0000 [IU] | Freq: Three times a day (TID) | INTRAMUSCULAR | Status: DC
Start: 2012-04-15 — End: 2012-04-17
  Administered 2012-04-15 – 2012-04-17 (×7): 5000 [IU] via SUBCUTANEOUS
  Filled 2012-04-14 (×10): qty 1

## 2012-04-14 MED ORDER — HEPARIN SODIUM (PORCINE) 5000 UNIT/ML IJ SOLN
5000.0000 [IU] | Freq: Once | INTRAMUSCULAR | Status: AC
  Administered 2012-04-14: 5000 [IU] via SUBCUTANEOUS
  Filled 2012-04-14: qty 1

## 2012-04-14 MED ORDER — LACTATED RINGERS IV SOLN: INTRAVENOUS | Status: DC

## 2012-04-14 MED ORDER — LIDOCAINE HCL (PF) 2 % IJ SOLN
INTRAMUSCULAR | Status: DC | PRN
  Administered 2012-04-14: 100 mg

## 2012-04-14 MED ORDER — LACTATED RINGERS IV SOLN
INTRAVENOUS | Status: DC | PRN
  Administered 2012-04-14 (×3): via INTRAVENOUS

## 2012-04-14 MED ORDER — CISATRACURIUM BESYLATE (PF) 10 MG/5ML IV SOLN
INTRAVENOUS | Status: DC | PRN
  Administered 2012-04-14: 2 mg via INTRAVENOUS
  Administered 2012-04-14: 12 mg via INTRAVENOUS
  Administered 2012-04-14 (×2): 2 mg via INTRAVENOUS

## 2012-04-14 MED ORDER — BUPIVACAINE 0.25 % ON-Q PUMP DUAL CATH 300 ML: 300.0000 mL | INJECTION | Status: DC

## 2012-04-14 MED ORDER — STERILE WATER FOR IRRIGATION IR SOLN
Status: DC | PRN
  Administered 2012-04-14: 1500 mL

## 2012-04-14 MED ORDER — PANTOPRAZOLE SODIUM 40 MG PO TBEC
40.0000 mg | DELAYED_RELEASE_TABLET | Freq: Every day | ORAL | Status: DC
  Administered 2012-04-15 – 2012-04-17 (×3): 40 mg via ORAL
  Filled 2012-04-14 (×3): qty 1

## 2012-04-14 MED ORDER — 0.9 % SODIUM CHLORIDE (POUR BTL) OPTIME
TOPICAL | Status: DC | PRN
  Administered 2012-04-14: 1000 mL

## 2012-04-14 MED ORDER — SODIUM CHLORIDE 0.9 % IV SOLN
INTRAVENOUS | Status: DC | PRN
  Administered 2012-04-14: 11:00:00 via INTRAPERITONEAL

## 2012-04-14 MED ORDER — FENTANYL CITRATE 0.05 MG/ML IJ SOLN
INTRAMUSCULAR | Status: DC | PRN
  Administered 2012-04-14: 50 ug via INTRAVENOUS

## 2012-04-14 MED ORDER — NEOSTIGMINE METHYLSULFATE 1 MG/ML IJ SOLN
INTRAMUSCULAR | Status: DC | PRN
  Administered 2012-04-14: 4 mg via INTRAVENOUS

## 2012-04-14 MED ORDER — ONE-DAILY MULTI VITAMINS PO TABS: 1.0000 | ORAL_TABLET | Freq: Every day | ORAL | Status: DC

## 2012-04-14 MED ORDER — BUPIVACAINE ON-Q PAIN PUMP (FOR ORDER SET NO CHG)
INJECTION | Status: DC
  Filled 2012-04-14: qty 1

## 2012-04-14 MED ORDER — SERTRALINE HCL 50 MG PO TABS
50.0000 mg | ORAL_TABLET | Freq: Every day | ORAL | Status: DC
  Administered 2012-04-15 – 2012-04-17 (×3): 50 mg via ORAL
  Filled 2012-04-14 (×3): qty 1

## 2012-04-14 MED ORDER — LEVOTHYROXINE SODIUM 25 MCG PO TABS
25.0000 ug | ORAL_TABLET | Freq: Every day | ORAL | Status: DC
  Administered 2012-04-15 – 2012-04-17 (×3): 25 ug via ORAL
  Filled 2012-04-14 (×4): qty 1

## 2012-04-14 MED ORDER — DEXTROSE 5 % IV SOLN
2.0000 g | INTRAVENOUS | Status: AC
  Administered 2012-04-14: 2 g via INTRAVENOUS
  Filled 2012-04-14 (×2): qty 2

## 2012-04-14 MED ORDER — KCL IN DEXTROSE-NACL 40-5-0.9 MEQ/L-%-% IV SOLN
INTRAVENOUS | Status: DC
  Administered 2012-04-14: 14:00:00 via INTRAVENOUS
  Administered 2012-04-15: 50 mL/h via INTRAVENOUS
  Filled 2012-04-14 (×3): qty 1000

## 2012-04-14 MED ORDER — ALVIMOPAN 12 MG PO CAPS
12.0000 mg | ORAL_CAPSULE | Freq: Two times a day (BID) | ORAL | Status: DC
  Administered 2012-04-15: 12 mg via ORAL
  Filled 2012-04-14 (×2): qty 1

## 2012-04-14 MED ORDER — KETAMINE HCL 10 MG/ML IJ SOLN
INTRAMUSCULAR | Status: DC | PRN
  Administered 2012-04-14 (×2): 10 mg via INTRAVENOUS

## 2012-04-14 MED ORDER — VITAMIN D3 25 MCG (1000 UNIT) PO TABS
1000.0000 [IU] | ORAL_TABLET | Freq: Every day | ORAL | Status: DC
  Administered 2012-04-15 – 2012-04-17 (×3): 1000 [IU] via ORAL
  Filled 2012-04-14 (×4): qty 1

## 2012-04-14 MED ORDER — METOPROLOL TARTRATE 1 MG/ML IV SOLN
5.0000 mg | Freq: Four times a day (QID) | INTRAVENOUS | Status: DC | PRN
  Filled 2012-04-14: qty 5

## 2012-04-14 MED ORDER — ADULT MULTIVITAMIN W/MINERALS CH
1.0000 | ORAL_TABLET | Freq: Every day | ORAL | Status: DC
  Administered 2012-04-15 – 2012-04-17 (×3): 1 via ORAL
  Filled 2012-04-14 (×4): qty 1

## 2012-04-14 MED ORDER — ACETAMINOPHEN 10 MG/ML IV SOLN
INTRAVENOUS | Status: DC | PRN
  Administered 2012-04-14: 1000 mg via INTRAVENOUS

## 2012-04-14 MED ORDER — DEXTROSE 5 % IV SOLN
2.0000 g | Freq: Two times a day (BID) | INTRAVENOUS | Status: AC
  Administered 2012-04-14: 2 g via INTRAVENOUS
  Filled 2012-04-14: qty 2

## 2012-04-14 MED ORDER — SUFENTANIL CITRATE 50 MCG/ML IV SOLN
INTRAVENOUS | Status: DC | PRN
  Administered 2012-04-14 (×8): 10 ug via INTRAVENOUS
  Administered 2012-04-14: 20 ug via INTRAVENOUS

## 2012-04-14 MED ORDER — PROMETHAZINE HCL 25 MG/ML IJ SOLN: 12.5000 mg | Freq: Four times a day (QID) | INTRAMUSCULAR | Status: DC | PRN

## 2012-04-14 SURGICAL SUPPLY — 74 items
APPLIER CLIP ROT 10 11.4 M/L (STAPLE)
BLADE SURG ROTATE 9660 (MISCELLANEOUS) IMPLANT
CANISTER SUCTION 2500CC (MISCELLANEOUS) ×2 IMPLANT
CATH FOLEY LATEX FREE 16FR (CATHETERS) ×2 IMPLANT
CATH KIT ON Q 7.5IN SLV (PAIN MANAGEMENT) IMPLANT
CELLS DAT CNTRL 66122 CELL SVR (MISCELLANEOUS) IMPLANT
CHLORAPREP W/TINT 26ML (MISCELLANEOUS) ×2 IMPLANT
CLIP APPLIE ROT 10 11.4 M/L (STAPLE) IMPLANT
CLOTH BEACON ORANGE TIMEOUT ST (SAFETY) ×2 IMPLANT
COVER SURGICAL LIGHT HANDLE (MISCELLANEOUS) IMPLANT
DECANTER SPIKE VIAL GLASS SM (MISCELLANEOUS) ×2 IMPLANT
DISSECTOR BLUNT TIP ENDO 5MM (MISCELLANEOUS) IMPLANT
DRAPE LG THREE QUARTER DISP (DRAPES) ×2 IMPLANT
DRAPE UTILITY XL STRL (DRAPES) ×2 IMPLANT
DRAPE WARM FLUID 44X44 (DRAPE) ×4 IMPLANT
DRSG OPSITE POSTOP 4X8 (GAUZE/BANDAGES/DRESSINGS) ×2 IMPLANT
DRSG TEGADERM 2-3/8X2-3/4 SM (GAUZE/BANDAGES/DRESSINGS) IMPLANT
DRSG TEGADERM 4X4.75 (GAUZE/BANDAGES/DRESSINGS) ×2 IMPLANT
ELECT REM PT RETURN 9FT ADLT (ELECTROSURGICAL) ×2
ELECTRODE REM PT RTRN 9FT ADLT (ELECTROSURGICAL) ×1 IMPLANT
ENDOLOOP SUT PDS II  0 18 (SUTURE) ×1
ENDOLOOP SUT PDS II 0 18 (SUTURE) ×1 IMPLANT
GLOVE BIOGEL PI IND STRL 6.5 (GLOVE) ×1 IMPLANT
GLOVE BIOGEL PI INDICATOR 6.5 (GLOVE) ×1
GLOVE ECLIPSE 8.0 STRL XLNG CF (GLOVE) ×4 IMPLANT
GLOVE INDICATOR 8.0 STRL GRN (GLOVE) ×4 IMPLANT
GOWN STRL NON-REIN LRG LVL3 (GOWN DISPOSABLE) ×2 IMPLANT
GOWN STRL REIN XL XLG (GOWN DISPOSABLE) ×10 IMPLANT
KIT BASIN OR (CUSTOM PROCEDURE TRAY) ×2 IMPLANT
LEGGING LITHOTOMY PAIR STRL (DRAPES) ×2 IMPLANT
LIGASURE IMPACT 36 18CM CVD LR (INSTRUMENTS) IMPLANT
NS IRRIG 1000ML POUR BTL (IV SOLUTION) ×4 IMPLANT
PACK GENERAL/GYN (CUSTOM PROCEDURE TRAY) IMPLANT
PENCIL BUTTON HOLSTER BLD 10FT (ELECTRODE) ×2 IMPLANT
RETRACTOR LONE STAR DISPOSABLE (INSTRUMENTS) IMPLANT
RETRACTOR STAY HOOK 5MM (MISCELLANEOUS) IMPLANT
RTRCTR WOUND ALEXIS 18CM MED (MISCELLANEOUS)
SCALPEL HARMONIC ACE (MISCELLANEOUS) IMPLANT
SCISSORS LAP 5X35 DISP (ENDOMECHANICALS) ×2 IMPLANT
SEALER TISSUE G2 CVD JAW 35 (ENDOMECHANICALS) ×1 IMPLANT
SEALER TISSUE G2 CVD JAW 45CM (ENDOMECHANICALS) ×1
SET IRRIG TUBING LAPAROSCOPIC (IRRIGATION / IRRIGATOR) IMPLANT
SLEEVE ENDOPATH XCEL 5M (ENDOMECHANICALS) ×2 IMPLANT
SPONGE GAUZE 4X4 12PLY (GAUZE/BANDAGES/DRESSINGS) IMPLANT
SPONGE LAP 18X18 X RAY DECT (DISPOSABLE) ×6 IMPLANT
STAPLER CIRC ILS CVD 33MM 37CM (STAPLE) ×2 IMPLANT
STAPLER CUT CVD 40MM BLUE (STAPLE) ×2 IMPLANT
STAPLER VISISTAT 35W (STAPLE) ×2 IMPLANT
SUCTION POOLE TIP (SUCTIONS) ×2 IMPLANT
SUT PROLENE 0 CT 2 (SUTURE) ×2 IMPLANT
SUT PROLENE 2 0 CT2 30 (SUTURE) IMPLANT
SUT PROLENE 2 0 KS (SUTURE) IMPLANT
SUT SILK 2 0 (SUTURE) ×1
SUT SILK 2 0 SH CR/8 (SUTURE) ×2 IMPLANT
SUT SILK 2-0 18XBRD TIE 12 (SUTURE) ×1 IMPLANT
SUT SILK 3 0 (SUTURE) ×1
SUT SILK 3 0 SH CR/8 (SUTURE) ×2 IMPLANT
SUT SILK 3-0 18XBRD TIE 12 (SUTURE) ×1 IMPLANT
SYR BULB IRRIGATION 50ML (SYRINGE) ×2 IMPLANT
SYS LAPSCP GELPORT 120MM (MISCELLANEOUS) ×2
SYSTEM LAPSCP GELPORT 120MM (MISCELLANEOUS) ×1 IMPLANT
TAPE UMBILICAL COTTON 1/8X30 (MISCELLANEOUS) IMPLANT
TOWEL OR 17X26 10 PK STRL BLUE (TOWEL DISPOSABLE) ×4 IMPLANT
TRAY FOLEY CATH 14FRSI W/METER (CATHETERS) IMPLANT
TRAY LAP CHOLE (CUSTOM PROCEDURE TRAY) ×2 IMPLANT
TROCAR XCEL 12X100 BLDLESS (ENDOMECHANICALS) IMPLANT
TROCAR XCEL BLUNT TIP 100MML (ENDOMECHANICALS) IMPLANT
TROCAR XCEL NON-BLD 11X100MML (ENDOMECHANICALS) IMPLANT
TROCAR XCEL NON-BLD 5MMX100MML (ENDOMECHANICALS) ×4 IMPLANT
TROCAR Z-THREAD BLADED 5X100MM (TROCAR) ×4 IMPLANT
TUBING FILTER THERMOFLATOR (ELECTROSURGICAL) ×2 IMPLANT
TUNNELER SHEATH ON-Q 16GX12 DP (PAIN MANAGEMENT) ×2 IMPLANT
WATER STERILE IRR 1000ML POUR (IV SOLUTION) ×2 IMPLANT
YANKAUER SUCT BULB TIP NO VENT (SUCTIONS) ×2 IMPLANT

## 2012-04-14 NOTE — H&P (View-Only) (Signed)
Subjective:     Patient ID: Lisa Osborne, female   DOB: 10-27-1940, 72 y.o.   MRN: [id number]  HPI  Lisa Osborne  1940-07-24 [id number]  Patient Care Team: Eustaquio Boyden, MD as PCP - General (Family Medicine) Ardeth Sportsman, MD as Consulting Physician (General Surgery)  This patient is a 72 y.o.female who presents today for surgical evaluation at the request of Dr. Arlyce Dice.   Reason for visit: Newly diagnosed colon cancer  Pleasant obese female.  Began to have worsening rectal bleeding.  Also intermittent crampy left lower quadrant abdominal pain.  Underwent colonoscopy.  Found to have small polyp in descending colon easily removed.  Had more friable bulky mass at rectosigmoid junction (20 cm) biopsy consistent with cancer.  She was sent in for surgical evaluation.    She usually has a bowel movement every day.  Some loose stools when she "binges on sugar".  Claims glucoses are under good control but cannot give me numbers.  No history of skin infections.  She can walk 1 mile without any difficulty.  No personal nor family history of GI/colon cancer, inflammatory bowel disease, irritable bowel syndrome, allergy such as Celiac Sprue, dietary/dairy problems, colitis, ulcers nor gastritis.  No recent sick contacts/gastroenteritis.  No travel outside the country.  No changes in diet.    Patient Active Problem List  Diagnosis  . IDA (iron deficiency anemia)  . Depression  . Diabetes mellitus type 2, controlled  . GERD (gastroesophageal reflux disease)  . Hypothyroidism  . Urge incontinence  . Blood in stool  . Medicare annual wellness visit, initial  . Colon adenocarcinoma - rectosigmoid     Past Medical History  Diagnosis Date  . History of iron deficiency anemia   . Arthritis     left side lower back pain  . History of chicken pox   . Depression     severe in past  . Diabetes mellitus type 2, controlled 2006    diet controlled  . GERD (gastroesophageal reflux  disease)     bad if off PPI  . Seasonal allergies   . Hypothyroidism     mild  . Urine incontinence     mild, wears pad  . Unilateral deafnesses     left ear deaf, wears hearing aid right side  . Rosacea   . Anemia   . Colon adenocarcinoma 2014    sigmoid    Past Surgical History  Procedure Laterality Date  . Total vaginal hysterectomy  1985    cancer cells on cervix  . Abdominoplasty  1996  . Incontinence surgery  1996    bladder sling  . Colonoscopy  08/2006    normal  . Dexa  08/06/2003    normal  . Facelift  05/2011  . Abdominal hysterectomy  1985    total    History   Social History  . Marital Status: Single    Spouse Name: N/A    Number of Children: 1  . Years of Education: N/A   Occupational History  . Retired Runner, broadcasting/film/video    Social History Main Topics  . Smoking status: Former Games developer  . Smokeless tobacco: Former Neurosurgeon    Quit date: 03/20/1966  . Alcohol Use: Yes     Comment: seldom  . Drug Use: No  . Sexually Active: Not on file   Other Topics Concern  . Not on file   Social History Narrative   Caffeine: 1 cup coffee/day   Lives alone.  1 daughter Victorino Dike), 3 stillborns   Occupation: retired, was Air traffic controller.   Activity: no regular activity but does park far away, has treadmill at home   Diet: good fruits/vegetables, more fruit/vegetable smoothies  good amt water    Family History  Problem Relation Age of Onset  . Breast cancer Mother 51    breast cancer then multiple myeloma  . Cancer Mother     breast  . Alcohol abuse Father   . Heart disease Father     heart failure  . Mental illness Brother     2 brothers severe depression, suicide x2  . Diabetes Maternal Aunt   . Diabetes Maternal Uncle   . Diabetes Paternal Uncle   . Diabetes Paternal Grandfather   . Coronary artery disease Neg Hx   . Stroke Neg Hx     Current Outpatient Prescriptions  Medication Sig Dispense Refill  . calcium gluconate 500 MG tablet Take 500 mg by mouth daily.         . cholecalciferol (VITAMIN D) 1000 UNITS tablet Take 1,000 Units by mouth daily.        . ferrous sulfate 325 (65 FE) MG tablet Take 325 mg by mouth daily with breakfast.      . lansoprazole (PREVACID) 15 MG capsule Take 15 mg by mouth daily.        Marland Kitchen levothyroxine (SYNTHROID, LEVOTHROID) 25 MCG tablet Take 1 tablet (25 mcg total) by mouth daily.  90 tablet  3  . Multiple Vitamin (MULTIVITAMIN) tablet Take 1 tablet by mouth daily.        . sertraline (ZOLOFT) 50 MG tablet Take 1 tablet (50 mg total) by mouth daily.  90 tablet  3  . vitamin B-12 (CYANOCOBALAMIN) 100 MCG tablet Take 50 mcg by mouth daily.      . [DISCONTINUED] oxybutynin (DITROPAN) 5 MG tablet Take 1 tablet (5 mg total) by mouth 3 (three) times daily as needed.  60 tablet  0   No current facility-administered medications for this visit.     Allergies  Allergen Reactions  . Latex     Mild reaction  . Nickel     Slight itch  . Shrimp (Shellfish Allergy)     Slight allergy - itching    BP 132/80  Pulse 78  Temp(Src) 97.4 F (36.3 C) (Temporal)  Ht 5\' 9"  (1.753 m)  Wt 228 lb 9.6 oz (103.692 kg)  BMI 33.74 kg/m2  SpO2 96%  Ct Chest W Contrast  03/15/2012  *RADIOLOGY REPORT*  Clinical Data:   Colon mass.  Abdominal cramping.  CT CHEST, ABDOMEN AND PELVIS WITH CONTRAST  Technique:  Multidetector CT imaging of the chest, abdomen and pelvis was performed following the standard protocol during bolus administration of intravenous contrast.  Contrast: OMNIPAQUE IOHEXOL 300 MG/ML  SOLN,  Comparison:   None.  CT CHEST  Findings:  No axillary axillary or supraclavicular adenopathy. There is no mediastinal or hilar adenopathy identified.  Heart size is normal.  There is no pericardial effusion.  No suspicious pulmonary nodule or mass.  Review of the visualized bony structures is unremarkable.  No worrisome lytic or sclerotic bone lesions.  IMPRESSION:  1.  No evidence for metastatic disease to the chest.  CT ABDOMEN AND  PELVIS  Findings:  There are fluid attenuation structures within the left hepatic lobe which likely represents cysts.  These measure up to 2.7 cm, image 45/series 2.  6 mm hypodensity in the right hepatic lobe  is too small to characterize, image 54.  The gallbladder appears normal.  Normal appearance of the pancreas.  The spleen is unremarkable.  The adrenal glands are both normal.  Kidneys are unremarkable. The kidneys are both unremarkable.  The urinary bladder appears normal. Previous hysterectomy.  There are no enlarged upper abdominal lymph nodes.  No iliac or inguinal adenopathy.  No ascites.  There is a moderate size hiatal hernia.  Stomach otherwise normal.  The small bowel loops are unremarkable.  The proximal colon appears normal. Circumferential masslike wall thickening is noted with a single wall thickness of 1.2 cm, image 109.  There is an enlarged lymph node adjacent to the sigmoid colon which  1.4 cm, image 105/series 2.  No evidence for bowel obstruction.  Review of the visualized osseous structures is significant for lumbar degenerative disc disease.  IMPRESSION:  1.  Mass circumferential mass involving the sigmoid colon is identified compatible with primary colon carcinoma.  There is no evidence for bowel obstruction at this time. 2.  Enlarged lymph node adjacent to sigmoid colon is worrisome for metastatic adenopathy. 3.  No specific features identified to suggest distant metastatic disease.   Original Report Authenticated By: Signa Kell, M.D.    Ct Abdomen Pelvis W Contrast  03/15/2012  *RADIOLOGY REPORT*  Clinical Data:   Colon mass.  Abdominal cramping.  CT CHEST, ABDOMEN AND PELVIS WITH CONTRAST  Technique:  Multidetector CT imaging of the chest, abdomen and pelvis was performed following the standard protocol during bolus administration of intravenous contrast.  Contrast: OMNIPAQUE IOHEXOL 300 MG/ML  SOLN,  Comparison:   None.  CT CHEST  Findings:  No axillary axillary or  supraclavicular adenopathy. There is no mediastinal or hilar adenopathy identified.  Heart size is normal.  There is no pericardial effusion.  No suspicious pulmonary nodule or mass.  Review of the visualized bony structures is unremarkable.  No worrisome lytic or sclerotic bone lesions.  IMPRESSION:  1.  No evidence for metastatic disease to the chest.  CT ABDOMEN AND PELVIS  Findings:  There are fluid attenuation structures within the left hepatic lobe which likely represents cysts.  These measure up to 2.7 cm, image 45/series 2.  6 mm hypodensity in the right hepatic lobe is too small to characterize, image 54.  The gallbladder appears normal.  Normal appearance of the pancreas.  The spleen is unremarkable.  The adrenal glands are both normal.  Kidneys are unremarkable. The kidneys are both unremarkable.  The urinary bladder appears normal. Previous hysterectomy.  There are no enlarged upper abdominal lymph nodes.  No iliac or inguinal adenopathy.  No ascites.  There is a moderate size hiatal hernia.  Stomach otherwise normal.  The small bowel loops are unremarkable.  The proximal colon appears normal. Circumferential masslike wall thickening is noted with a single wall thickness of 1.2 cm, image 109.  There is an enlarged lymph node adjacent to the sigmoid colon which  1.4 cm, image 105/series 2.  No evidence for bowel obstruction.  Review of the visualized osseous structures is significant for lumbar degenerative disc disease.  IMPRESSION:  1.  Mass circumferential mass involving the sigmoid colon is identified compatible with primary colon carcinoma.  There is no evidence for bowel obstruction at this time. 2.  Enlarged lymph node adjacent to sigmoid colon is worrisome for metastatic adenopathy. 3.  No specific features identified to suggest distant metastatic disease.   Original Report Authenticated By: Signa Kell, M.D.  Review of Systems  Constitutional: Negative for fever, chills, diaphoresis,  appetite change and fatigue.  HENT: Negative for ear pain, sore throat, trouble swallowing, neck pain and ear discharge.   Eyes: Negative for photophobia, discharge and visual disturbance.  Respiratory: Negative for cough, choking, chest tightness and shortness of breath.   Cardiovascular: Negative for chest pain and palpitations.  Gastrointestinal: Negative for nausea, vomiting, abdominal pain, diarrhea, constipation, anal bleeding and rectal pain.  Genitourinary: Negative for dysuria, frequency and difficulty urinating.  Musculoskeletal: Negative for myalgias and gait problem.  Skin: Negative for color change, pallor and rash.  Neurological: Negative for dizziness, speech difficulty, weakness and numbness.  Hematological: Negative for adenopathy.  Psychiatric/Behavioral: Negative for confusion and agitation. The patient is not nervous/anxious.        Objective:   Physical Exam  Constitutional: She is oriented to person, place, and time. She appears well-developed and well-nourished. No distress.  HENT:  Head: Normocephalic.  Mouth/Throat: Oropharynx is clear and moist. No oropharyngeal exudate.  Eyes: Conjunctivae and EOM are normal. Pupils are equal, round, and reactive to light. No scleral icterus.  Neck: Normal range of motion. Neck supple. No tracheal deviation present.  Cardiovascular: Normal rate, regular rhythm and intact distal pulses.   Pulmonary/Chest: Effort normal and breath sounds normal. No respiratory distress. She exhibits no tenderness.  Abdominal: Soft. She exhibits no distension and no mass. There is no tenderness. There is no rigidity, no guarding, no CVA tenderness, no tenderness at McBurney's point and negative Murphy's sign. No hernia. Hernia confirmed negative in the ventral area, confirmed negative in the right inguinal area and confirmed negative in the left inguinal area.    Obese but soft.  No hernias.  Genitourinary: No vaginal discharge found.    Musculoskeletal: Normal range of motion. She exhibits no tenderness.       Lumbar back: She exhibits normal range of motion, no bony tenderness and no deformity.  Mild lower back paraspinal soreness.  No trigger point.  Lymphadenopathy:    She has no cervical adenopathy.       Right: No inguinal adenopathy present.       Left: No inguinal adenopathy present.  Neurological: She is alert and oriented to person, place, and time. No cranial nerve deficit. She exhibits normal muscle tone. Coordination normal.  Skin: Skin is warm and dry. No rash noted. She is not diaphoretic. No erythema.  Psychiatric: She has a normal mood and affect. Her behavior is normal. Judgment and thought content normal.   CEA = 6.5    Assessment:     Newly diagnosed cancer of rectosigmoid junction.     Plan:     I think she would benefit from segmental resection of colon cancer at the rectosigmoid junction.  Probably will need a low anterior with an EEA anastomosis.  I did discuss procedure with her at length:  The anatomy & physiology of the digestive tract was discussed.  The pathophysiology was discussed.  Natural history risks without surgery was discussed.   I worked to give an overview of the disease and the frequent need to have multispecialty involvement.  I feel the risks of no intervention will lead to serious problems that outweigh the operative risks; therefore, I recommended a partial proctocolectomy to remove the pathology.  Laparoscopic & open techniques were discussed.  We will work to preserve anal & pelvic floor function without sacrificing cure.  Risks such as bleeding, infection, abscess, leak, reoperation, possible ostomy, hernia, heart attack,  death, and other risks were discussed.  I noted a good likelihood this will help address the problem.   Goals of post-operative recovery were discussed as well.  We will work to minimize complications.  An educational handout on the pathology was given as  well.  Questions were answered.    The patient expresses understanding & wishes to proceed with surgery.

## 2012-04-14 NOTE — Interval H&P Note (Signed)
History and Physical Interval Note:  04/14/2012 7:22 AM  Lisa Osborne  has presented today for surgery, with the diagnosis of cancer at rectosigmoid junction  The various methods of treatment have been discussed with the patient and family. After consideration of risks, benefits and other options for treatment, the patient has consented to  Procedure(s): LAPAROSCOPIC LOW ANTERIOR RESECTION  rigid proctoscopy  (N/A) as a surgical intervention .  The patient's history has been reviewed, patient examined, no change in status, stable for surgery.  I have reviewed the patient's chart and labs.  Questions were answered to the patient's satisfaction.     Riki Berninger C.

## 2012-04-14 NOTE — Op Note (Signed)
04/14/2012  10:50 AM  PATIENT:  Lisa Osborne  72 y.o. female  Patient Care Team: Eustaquio Boyden, MD as PCP - General (Family Medicine) Ardeth Sportsman, MD as Consulting Physician (General Surgery) Louis Meckel, MD as Consulting Physician (Gastroenterology)  PRE-OPERATIVE DIAGNOSIS:  cancer at rectosigmoid junction  POST-OPERATIVE DIAGNOSIS:  cancer at rectosigmoid junction  PROCEDURE:  Procedure(s): LAPAROSCOPIC LOW ANTERIOR RESECTION   Mobilization of Splenic flexure of colon Rigid proctoscopy  Lysis of adhesions  SURGEON:  Surgeon(s): Ardeth Sportsman, MD Ernestene Mention, MD - Asst  ANESTHESIA:   local and general  EBL:  Total I/O In: 2000 [I.V.:2000] Out: 425 [Urine:350; Blood:75]  Delay start of Pharmacological VTE agent (>24hrs) due to surgical blood loss or risk of bleeding:  no  DRAINS: none   SPECIMEN:  Source of Specimen:  Rectosigmoid with anastomotic rings (blue stitch = proximal ring)  DISPOSITION OF SPECIMEN:  PATHOLOGY  COUNTS:  YES  PLAN OF CARE: Admit to inpatient   PATIENT DISPOSITION:  PACU - hemodynamically stable.  INDICATION:    Patient with cancer at rectosigmoid junction.  I recommended segmental resection:  The anatomy & physiology of the digestive tract was discussed.  The pathophysiology was discussed.  Natural history risks without surgery was discussed.   I worked to give an overview of the disease and the frequent need to have multispecialty involvement.  I feel the risks of no intervention will lead to serious problems that outweigh the operative risks; therefore, I recommended a partial colectomy to remove the pathology.  Laparoscopic & open techniques were discussed.   Risks such as bleeding, infection, abscess, leak, reoperation, possible ostomy, hernia, heart attack, death, and other risks were discussed.  I noted a good likelihood this will help address the problem.   Goals of post-operative recovery were discussed as  well.  We will work to minimize complications.  An educational handout on the pathology was given as well.  Questions were answered.    The patient expressed understanding & wished to proceed with surgery.  OR FINDINGS:   Patient had large bulky mass in proximal rectum  No obvious metastatic disease on visceral parietal peritoneum or liver.  The anastomosis rests 12 cm from the anal verge by rigid proctoscopy.  DESCRIPTION:   Informed consent was confirmed.  The patient underwent general anaesthesia without difficulty.  The patient was positioned appropriately.  VTE prevention in place.  The patient's abdomen was clipped, prepped, & draped in a sterile fashion.  Surgical timeout confirmed our plan.  The patient was positioned in reverse Trendelenburg.  Abdominal entry was gained using optical entry technique in the right upper abdomen.  Entry was clean.  I induced carbon dioxide insufflation.  Camera inspection revealed no injury.  Extra ports were carefully placed under direct laparoscopic visualization.  There is some moderate avascular adhesions of greater omentum and small bowel down to the pelvis given her prior hysterectomy.  We carefully freed that off for lysis of adhesions.  I reflected the greater omentum and the upper abdomen the small bowel in the upper abdomen. I scored the base of peritoneum of the right side of the mesentery of the left colon from the ligament of Treitz to the peritoneal rectal reflection.   The patient had a bulky mass at the rectosigmoid junction going into the proximal rectum.  We could see some tattooing in the mesentery at that junction  I elevated the sigmoid mesentery and got into the retro-mesenteric plane.  We were able to identify the left ureter and gonadal vessels. We kept those posterior within the retroperitoneum and elevated the left colon mesentery off that. I did free off of mesentery to the IMA pedicle but did not ligate it yet.  I continued distally  and got into the avascular plane posterior to the mesorectum. This allowed me to help mobilize the rectum as well by freeing the mesorectum off the sacrum.I mobilized the peritoneal coverings to the peritoneal reflection on both the right and left sides of the rectum.  I could see the right and left ureters and stayed away from the house.  I kept in the lateral vascular pedicles to the rectum intact.  There is some adhesions in the anterior pelvis consistent with her prior hysterectomy but we carefully freed those off.  I skeletonized the lymph nodes off the inferior mesenteric artery pedicle.  I went down to its takeoff from the aorta.  I isolated the inferior mesenteric vein off of the ligament of Treitz just cephalad to that as well.  After confirming the left ureter was out of the way, I went ahead and ligated the inferior mesenteric artery pedicle with bipolar EnSeal just near its takeoff from the aorta.  I reinforced ligation with a 0 PDS Endoloop.  I did ligate the inferior mesenteric vein but no PDS ligation needed there.  We ensured hemostasis. I skeletonized the mesorectum at the junction at the proximal rectum using blunt dissection & bipolar EnSeal.  I mobilized the left colon some more to ensure good mobilization of the left colon to reach into the pelvis.  I placed a GelPort has a wound protector through a Pfannenstiel incision in the suprapubic region through her prior hysterectomy incision, taking care to avoid bladder injury. I transected bowel at the junction between the proximal and mid rectum using a contour stapler. I was able to eviscerate the rectosigmoid and descending colon out the wound.  I chose a region at the descending/sigmoid junction that was soft and easily reached down. I clamped the colon proximal to this area using a soft bowel clamp. I transected at the descending/sigmoid junction with a scalpel. Prep was poor but we protected the stool from any spillage.  I got healthy  bleeding mucosa. I transected the remaining specimen mesentery in a radial fashion to preserve good blood supply to the proximal colon end.  We sent the rectosigmoid colon specimen off to go to pathology.  We sized the colon orifice.  I chose a 33 EEA anvil stapler system. I placed the anvil to the open end of the descending colon and closed around it using a 0 Prolene pursestring.  We did copious irrigation with crystalloid solution.  Hemostasis was good.      I scrubbed down and did gentle anal dilation and advanced the EEA stapler up the rectal stump. The spike was brought out at the provimal end of the rectal stump under direct visualization.  Dr Derrell Lolling attached the anvil of the proximal colon the spike of the stapler. Anvil was tightened down and held clamped for 60 seconds. The EEA stapler was fired and held clamped for 30 seconds. The stapler was released & removed. We noted 2 excellent anastomotic rings. Blue stitch is in the proximal ring.  I did rigid proctoscopy noted the anastomosis was at 12 cm from the anal verge consistent with the proximal rectum.  We did a final irrigation of antibiotic solution (900 mg clindamycin/240 mg gentamicin in a liter  of crystalloid) & held that for the pelvic air leak test .The rectum was insufflated the rectum while clamping the colon proximal to that anastomosis.  There was a negative air leak test. There was no tension. Anastomosis & colon looked viable.    We changed gown and gloves.  We did diagnostic laparoscopy.  We aspirated the antibiotic irrigation.  Hemostasis was good.   Ureters & bowel uninjured.  The anastomosis looked healthy.  I placed On-Q catheter and sheaths into the preperitoneal space under direct palpation.  I removed CO2 gas out through the ports.  I closed the 5mm port sites using Monocryl stitch and sterile dressing.  Closed the Pfannenstiel wound using a 0 Vicryl vertical peritoneal closure and a #1 PDS transverse anterior rectal fascial  closure. I closed the skin with some interrupted Monocryl stitches. I placed antibiotic-soaked wicks in between those areas. I placed sterile dressing.  OnQ catheters placed & sheaths peeled away.  Patient is being extubated go to recovery room. I discussed postop care with the patient in detail the office & in the holding area. Instructions are written. I tried to to locate family and discuss it with them as well, but no one is available at this time.  I will try later.

## 2012-04-14 NOTE — Care Management Note (Addendum)
    Page 1 of 1   04/17/2012     9:56:22 AM   CARE MANAGEMENT NOTE 04/17/2012  Patient:  Lisa Osborne, Lisa Osborne.   Account Number:  [id number]  Date Initiated:  04/14/2012  Documentation initiated by:  [facility]  Subjective/Objective Assessment:   ADMITTED W/RECTOSIGMOID COLON CA.     Action/Plan:   FROM HOME.HAS PCP,PHARMACY.   Anticipated DC Date:  04/18/2012   Anticipated DC Plan:  HOME/SELF CARE      DC Planning Services  CM consult      Choice offered to / List presented to:             Status of service:  Completed, signed off Medicare Important Message given?   (If response is "NO", the following Medicare IM given date fields will be blank) Date Medicare IM given:   Date Additional Medicare IM given:    Discharge Disposition:  HOME/SELF CARE  Per UR Regulation:  Reviewed for med. necessity/level of care/duration of stay  If discussed at Long Length of Stay Meetings, dates discussed:    Comments:  04/14/12 [facility] RN,BSN NCM [phone]

## 2012-04-14 NOTE — Anesthesia Postprocedure Evaluation (Signed)
  Anesthesia Post-op Note  Patient: Lisa Osborne  Procedure(s) Performed: Procedure(s) (LRB): LAPAROSCOPIC LOW ANTERIOR RESECTION  rigid proctoscopy splenic flexure mobilization lysis of adhesion (N/A)  Patient Location: PACU  Anesthesia Type: General  Level of Consciousness: awake and alert   Airway and Oxygen Therapy: Patient Spontanous Breathing  Post-op Pain: mild  Post-op Assessment: Post-op Vital signs reviewed, Patient's Cardiovascular Status Stable, Respiratory Function Stable, Patent Airway and No signs of Nausea or vomiting  Last Vitals:  Filed Vitals:   04/14/12 1215  BP: 157/78  Pulse: 71  Temp: 36.7 C  Resp: 12    Post-op Vital Signs: stable   Complications: No apparent anesthesia complications

## 2012-04-14 NOTE — Transfer of Care (Signed)
Immediate Anesthesia Transfer of Care Note  Patient: Lisa Osborne  Procedure(s) Performed: Procedure(s): LAPAROSCOPIC LOW ANTERIOR RESECTION  rigid proctoscopy splenic flexure mobilization lysis of adhesion (N/A)  Patient Location: PACU  Anesthesia Type:General  Level of Consciousness: alert , sedated and patient cooperative  Airway & Oxygen Therapy: Patient Spontanous Breathing and Patient connected to face mask oxygen  Post-op Assessment: Report given to PACU RN and Post -op Vital signs reviewed and stable  Post vital signs: Reviewed and stable  Complications: No apparent anesthesia complications

## 2012-04-15 LAB — CBC
HCT: 30.2 % — ABNORMAL LOW (ref 36.0–46.0)
Hemoglobin: 10.1 g/dL — ABNORMAL LOW (ref 12.0–15.0)
WBC: 8.4 10*3/uL (ref 4.0–10.5)

## 2012-04-15 LAB — BASIC METABOLIC PANEL
BUN: 10 mg/dL (ref 6–23)
Chloride: 100 mEq/L (ref 96–112)
GFR calc Af Amer: 71 mL/min — ABNORMAL LOW (ref >90)
Potassium: 4.7 mEq/L (ref 3.5–5.1)

## 2012-04-15 NOTE — Progress Notes (Signed)
1 Day Post-Op  Subjective: Stable and alert. Looks good. No stool or flatus. Tolerating clear liquids. No nausea. Has ambulated a little bit.Good urine output.  Hemoglobin 10.1, the BBC 8400. Chemistries normal.  Objective: Vital signs in last 24 hours: Temp:  [97.6 F (36.4 C)-99.4 F (37.4 C)] 98 F (36.7 C) (03/22 1000) Pulse Rate:  [65-111] 75 (03/22 1000) Resp:  [8-22] 17 (03/22 1000) BP: (128-167)/(59-108) 129/70 mmHg (03/22 1000) SpO2:  [93 %-100 %] 93 % (03/22 1000) Weight:  [231 lb 2 oz (104.838 kg)] 231 lb 2 oz (104.838 kg) (03/21 1215)    Intake/Output from previous day: 03/21 0701 - 03/22 0700 In: 4844.2 [P.O.:1460; I.V.:3384.2] Out: 3485 [Urine:3410; Blood:75] Intake/Output this shift: Total I/O In: 360 [P.O.:360] Out: 700 [Urine:700]  General appearance: alert. In no distress. Pleasant. Cooperative. Mental status normal Resp: clear to auscultation bilaterally GI: abdomen soft. Somewhat obese. Minimal bowel sounds. Wounds looked good. On-Q in place. Not distended.  Lab Results:  Results for orders placed during the hospital encounter of 04/14/12 (from the past 24 hour(s))  GLUCOSE, CAPILLARY     Status: Abnormal   Collection Time    04/14/12 11:02 AM      Result Value Range   Glucose-Capillary 180 (*) 70 - 99 mg/dL   Comment 1 Documented in Chart     Comment 2 Notify RN    BASIC METABOLIC PANEL     Status: Abnormal   Collection Time    04/15/12  5:55 AM      Result Value Range   Sodium 136  135 - 145 mEq/L   Potassium 4.7  3.5 - 5.1 mEq/L   Chloride 100  96 - 112 mEq/L   CO2 27  19 - 32 mEq/L   Glucose, Bld 144 (*) 70 - 99 mg/dL   BUN 10  6 - 23 mg/dL   Creatinine, Ser 1.61  0.50 - 1.10 mg/dL   Calcium 8.9  8.4 - 09.6 mg/dL   GFR calc non Af Amer 62 (*) >90 mL/min   GFR calc Af Amer 71 (*) >90 mL/min  CBC     Status: Abnormal   Collection Time    04/15/12  5:55 AM      Result Value Range   WBC 8.4  4.0 - 10.5 K/uL   RBC 3.37 (*) 3.87 - 5.11  MIL/uL   Hemoglobin 10.1 (*) 12.0 - 15.0 g/dL   HCT 04.5 (*) 40.9 - 81.1 %   MCV 89.6  78.0 - 100.0 fL   MCH 30.0  26.0 - 34.0 pg   MCHC 33.4  30.0 - 36.0 g/dL   RDW 91.4  78.2 - 95.6 %   Platelets 228  150 - 400 K/uL  MAGNESIUM     Status: None   Collection Time    04/15/12  5:55 AM      Result Value Range   Magnesium 1.9  1.5 - 2.5 mg/dL     Studies/Results: @RISRSLT24 @  . acetaminophen  1,000 mg Oral TID  . alvimopan  12 mg Oral BID  . calcium gluconate  500 mg Oral Daily  . cholecalciferol  1,000 Units Oral Daily  . heparin subcutaneous  5,000 Units Subcutaneous Q8H  . levothyroxine  25 mcg Oral QAC breakfast  . lip balm  1 application Topical BID  . multivitamin with minerals  1 tablet Oral Daily  . pantoprazole  40 mg Oral Daily  . saccharomyces boulardii  250 mg Oral BID  .  sertraline  50 mg Oral Daily  . vitamin B-12  100 mcg Oral Daily     Assessment/Plan: s/p Procedure(s): LAPAROSCOPIC LOW ANTERIOR RESECTION  rigid proctoscopy splenic flexure mobilization lysis of adhesion  POD #1. Stable. Looks good. Full liquid diet Discontinue Foley tomorrow Subcutaneous heparin for DVT prophylaxis Encourage ambulation  @PROBHOSP @  LOS: 1 day    Cailen Mihalik M 04/15/2012  . .prob

## 2012-04-16 MED ORDER — HYDROCODONE-ACETAMINOPHEN 5-325 MG PO TABS
1.0000 | ORAL_TABLET | ORAL | Status: DC | PRN
  Administered 2012-04-16 (×2): 2 via ORAL
  Administered 2012-04-16 – 2012-04-17 (×2): 1 via ORAL
  Filled 2012-04-16 (×2): qty 1
  Filled 2012-04-16 (×2): qty 2

## 2012-04-16 NOTE — Progress Notes (Signed)
2 Days Post-Op  Subjective: Doing very well. Tolerating full liquids. Had 2 loose bowel movements. No nausea. Urine output high.  On-Q drain became dislodged and removed this morning. She has minimal discomfort. Foley has been removed. Ambulating in halls.  Objective: Vital signs in last 24 hours: Temp:  [98 F (36.7 C)-98.4 F (36.9 C)] 98.4 F (36.9 C) (03/23 0433) Pulse Rate:  [74-78] 74 (03/23 0433) Resp:  [17-20] 20 (03/23 0433) BP: (115-146)/(70-73) 137/73 mmHg (03/23 0433) SpO2:  [92 %-95 %] 92 % (03/23 0433) Weight:  [228 lb 8 oz (103.647 kg)] 228 lb 8 oz (103.647 kg) (03/23 0747)    Intake/Output from previous day: 03/22 0701 - 03/23 0700 In: 2160 [P.O.:960; I.V.:1200] Out: 4651 [Urine:4650; Stool:1] Intake/Output this shift: Total I/O In: -  Out: 200 [Urine:200]   Exam: General appearance: alert. No distress. Mental status normal. Cooperative. GI: abdomen is soft. Wounds are clean. Appropriate incisional tenderness. Bowel sounds present. Not distended.     Lab Results:  No results found for this or any previous visit (from the past 24 hour(s)).   Studies/Results: @RISRSLT24 @  . acetaminophen  1,000 mg Oral TID  . calcium gluconate  500 mg Oral Daily  . cholecalciferol  1,000 Units Oral Daily  . heparin subcutaneous  5,000 Units Subcutaneous Q8H  . levothyroxine  25 mcg Oral QAC breakfast  . lip balm  1 application Topical BID  . multivitamin with minerals  1 tablet Oral Daily  . pantoprazole  40 mg Oral Daily  . saccharomyces boulardii  250 mg Oral BID  . sertraline  50 mg Oral Daily  . vitamin B-12  100 mcg Oral Daily     Assessment/Plan: s/p Procedure(s): LAPAROSCOPIC LOW ANTERIOR RESECTION  rigid proctoscopy splenic flexure mobilization lysis of adhesion   POD #2. Stable. Excellent progress. Advanced to  low residue diet Voiding trial today P.O. vicodin Possible discharge tomorrow   @PROBHOSP @  LOS: 2 days    Christen Wardrop M. Derrell Lolling,  M.D., [facility] General and Minimally invasive Surgery Breast and Colorectal Surgery Office:   [phone] Pager:   [phone]  04/16/2012  . .prob

## 2012-04-16 NOTE — Progress Notes (Signed)
Pt walking in hall. q pump dangling from iv pole and not attached to pt. Pt unaware of when came out. Catheter intact. Dr Derrell Lolling notified

## 2012-04-17 ENCOUNTER — Encounter: Payer: Self-pay | Admitting: Family Medicine

## 2012-04-17 NOTE — Discharge Summary (Signed)
Physician Discharge Summary  Patient ID: Lisa Osborne MRN: [id number] DOB/AGE: November 16, 1940 72 y.o.  Admit date: 04/14/2012 Discharge date: 04/17/2012  Admission Diagnoses: Principal Problem:   Colon adenocarcinoma - rectosigmoid  Active Problems:   Diabetes mellitus type 2, controlled   IDA (iron deficiency anemia)   GERD (gastroesophageal reflux disease)  Discharge Diagnoses:  Principal Problem:   Colon adenocarcinoma - rectosigmoid  Active Problems:   Diabetes mellitus type 2, controlled   IDA (iron deficiency anemia)   GERD (gastroesophageal reflux disease)   Discharged Condition: good  Hospital Course: Pt underwent LAR for rectosigmoid cancer.  Postoperatively, the patient was placed on an anti-ileus protocol.  The patient mobilized and advanced to a solid diet gradually.  Pain was well-controlled and transitioned off IV medications.    By the time of discharge, the patient was walking well the hallways, eating food well, having flatus.  Pain was-controlled on an oral regimen.  Based on meeting DC criteria and recovering well, I felt it was safe for the patient to be discharged home with close followup.  Instructions were discussed in detail.  RN present in room They are written as well.   Consults: None  Significant Diagnostic Studies:   Treatments: surgery:   POST-OPERATIVE DIAGNOSIS: cancer at rectosigmoid junction   PROCEDURE:   LAPAROSCOPIC LOW ANTERIOR RESECTION  Mobilization of Splenic flexure of colon  Rigid proctoscopy  Lysis of adhesions   Discharge Exam: Blood pressure 118/73, pulse 76, temperature 97.8 F (36.6 C), temperature source Oral, resp. rate 20, height 5\' 9"  (1.753 m), weight 228 lb 8 oz (103.647 kg), SpO2 92.00%.  General: Pt awake/alert/oriented x4 in no major acute distress Eyes: PERRL, normal EOM. Sclera nonicteric Neuro: CN II-XII intact w/o focal sensory/motor deficits. Lymph: No head/neck/groin lymphadenopathy Psych:  No  delerium/psychosis/paranoia.  Smiling alert HENT: Normocephalic, Mucus membranes moist.  No thrush Neck: Supple, No tracheal deviation Chest: No pain.  Good respiratory excursion. CV:  Pulses intact.  Regular rhythm MS: Normal AROM mjr joints.  No obvious deformity Abdomen: Soft, Nondistended.  All dressings & wicks removed.  Scant serosanguinous drainage from Pfannenstiel incision Min tender.  No incarcerated hernias. Ext:  SCDs BLE.  No significant edema.  No cyanosis Skin: No petechiae / purpurae   Disposition: Final discharge disposition not confirmed  Discharge Orders   Future Orders Complete By Expires     Call MD for:  extreme fatigue  As directed     Call MD for:  extreme fatigue  As directed     Call MD for:  hives  As directed     Call MD for:  hives  As directed     Call MD for:  persistant nausea and vomiting  As directed     Call MD for:  persistant nausea and vomiting  As directed     Call MD for:  redness, tenderness, or signs of infection (pain, swelling, redness, odor or green/yellow discharge around incision site)  As directed     Call MD for:  redness, tenderness, or signs of infection (pain, swelling, redness, odor or green/yellow discharge around incision site)  As directed     Call MD for:  severe uncontrolled pain  As directed     Call MD for:  severe uncontrolled pain  As directed     Call MD for:  As directed     Comments:      Temperature > 101.47F    Call MD for:  As directed  Comments:      Temperature > 101.74F    Diet - low sodium heart healthy  As directed     Diet - low sodium heart healthy  As directed     Discharge instructions  As directed     Comments:      Please see discharge instruction sheets.  Also refer to handout given an office.  Please call our office if you have any questions or concerns [phone]    Discharge instructions  As directed     Comments:      Please see discharge instruction sheets.  Also refer to handout given an  office.  Please call our office if you have any questions or concerns [phone]    Discharge wound care:  As directed     Comments:      If you have closed incisions, shower and bathe over these incisions with soap and water every day.  Remove all surgical dressings on postoperative day #3.  You do not need to replace dressings over the closed incisions unless you feel more comfortable with a Band-Aid covering it.   If you have an open wound that requires packing, please see wound care instructions.  In general, remove all dressings, wash wound with soap and water and then replace with saline moistened gauze.  Do the dressing change at least every day.  Please call our office [phone] if you have further questions.    Discharge wound care:  As directed     Comments:      If you have closed incisions, shower and bathe over these incisions with soap and water every day.  Remove all surgical dressings on postoperative day #3.  You do not need to replace dressings over the closed incisions unless you feel more comfortable with a Band-Aid covering it.   If you have an open wound that requires packing, please see wound care instructions.  In general, remove all dressings, wash wound with soap and water and then replace with saline moistened gauze.  Do the dressing change at least every day.  Please call our office [phone] if you have further questions.    Driving Restrictions  As directed     Comments:      No driving until off narcotics and can safely swerve away without pain during an emergency    Driving Restrictions  As directed     Comments:      No driving until off narcotics and can safely swerve away without pain during an emergency    Increase activity slowly  As directed     Comments:      Walk an hour a day.  Use 20-30 minute walks.  When you can walk 30 minutes without difficulty, increase to low impact/moderate activities such as biking, jogging, swimming, sexual activity..   Eventually can increase to unrestricted activity when not feeling pain.  If you feel pain: STOP!Marland Kitchen   Let pain protect you from overdoing it.  Use ice/heat/over-the-counter pain medications to help minimize his soreness.  Use pain prescriptions as needed to remain active.  It is better to take extra pain medications and be more active than to stay bedridden to avoid all pain medications.    Increase activity slowly  As directed     Comments:      Walk an hour a day.  Use 20-30 minute walks.  When you can walk 30 minutes without difficulty, increase to low impact/moderate activities such as biking,  jogging, swimming, sexual activity..  Eventually can increase to unrestricted activity when not feeling pain.  If you feel pain: STOP!Marland Kitchen   Let pain protect you from overdoing it.  Use ice/heat/over-the-counter pain medications to help minimize his soreness.  Use pain prescriptions as needed to remain active.  It is better to take extra pain medications and be more active than to stay bedridden to avoid all pain medications.    Lifting restrictions  As directed     Comments:      Avoid heavy lifting initially.  Do not push through pain.  You have no specific weight limit.  Coughing and sneezing or four more stressful to your incision than any lifting you will do. Pain will protect you from injury.  Therefore, avoid intense activity until off all narcotic pain medications.  Coughing and sneezing or four more stressful to your incision than any lifting he will do.    Lifting restrictions  As directed     Comments:      Avoid heavy lifting initially.  Do not push through pain.  You have no specific weight limit.  Coughing and sneezing or four more stressful to your incision than any lifting you will do. Pain will protect you from injury.  Therefore, avoid intense activity until off all narcotic pain medications.  Coughing and sneezing or four more stressful to your incision than any lifting he will do.    May shower /  Bathe  As directed     May shower / Bathe  As directed     May walk up steps  As directed     May walk up steps  As directed     Sexual Activity Restrictions  As directed     Comments:      Sexual activity as tolerated.  Do not push through pain.  Pain will protect you from injury.    Sexual Activity Restrictions  As directed     Comments:      Sexual activity as tolerated.  Do not push through pain.  Pain will protect you from injury.    Walk with assistance  As directed     Comments:      Walk over an hour a day.  May use a walker/cane/companion to help with balance and stamina.    Walk with assistance  As directed     Comments:      Walk over an hour a day.  May use a walker/cane/companion to help with balance and stamina.        Medication List    TAKE these medications       calcium gluconate 500 MG tablet  Take 500 mg by mouth daily.     cholecalciferol 1000 UNITS tablet  Commonly known as:  VITAMIN D  Take 1,000 Units by mouth daily.     diphenhydrAMINE 25 MG tablet  Commonly known as:  SOMINEX  Take 25 mg by mouth at bedtime as needed for sleep.     ferrous sulfate 325 (65 FE) MG tablet  Take 325 mg by mouth daily with breakfast.     lansoprazole 15 MG capsule  Commonly known as:  PREVACID  Take 15 mg by mouth daily.     levothyroxine 25 MCG tablet  Commonly known as:  SYNTHROID, LEVOTHROID  Take 25 mcg by mouth daily before breakfast.     multivitamin tablet  Take 1 tablet by mouth daily.     oxyCODONE 5 MG immediate release  tablet  Commonly known as:  Oxy IR/ROXICODONE  Take 1-2 tablets (5-10 mg total) by mouth every 4 (four) hours as needed for pain.     sertraline 50 MG tablet  Commonly known as:  ZOLOFT  Take 50 mg by mouth daily before breakfast.     vitamin B-12 100 MCG tablet  Commonly known as:  CYANOCOBALAMIN  Take 100 mcg by mouth daily.           Follow-up Information   Follow up with Undra Trembath C., MD. Schedule an appointment as  soon as possible for a visit in 2 weeks.   Contact information:   [address] [place] [address] [phone]       Signed: Ardeth Sportsman. 04/17/2012, 8:05 AM

## 2012-04-19 ENCOUNTER — Telehealth (INDEPENDENT_AMBULATORY_CARE_PROVIDER_SITE_OTHER): Payer: Self-pay

## 2012-04-19 DIAGNOSIS — C19 Malignant neoplasm of rectosigmoid junction: Secondary | ICD-10-CM

## 2012-04-19 NOTE — Progress Notes (Signed)
Quick Note:  I called the patient to notify her or her node-positive cancer. I think she would benefit from a medical oncology consultation. Our GI coordinator, Vicie Mutters, will help set that up. The patient is having some loose stools. I recommend she try to do Pepto/Kayopectate or even Imodium to gradually slow things down. Probably some postobstructive diarrhea since this was a rather bulky rectosigmoid cancer. Should improve. Her appetite is okay. Her energy level is getting better. She continues on a multivitamin. She expressed appreciation. ______

## 2012-04-19 NOTE — Telephone Encounter (Signed)
Added pt to the GI cancer conference for 04/26/12 per Dr Gordy Savers request.

## 2012-04-20 ENCOUNTER — Telehealth: Payer: Self-pay | Admitting: *Deleted

## 2012-04-20 ENCOUNTER — Telehealth: Payer: Self-pay | Admitting: Oncology

## 2012-04-20 NOTE — Telephone Encounter (Signed)
Spoke with patient by phone and explained role of nurse navigator.  This RN confirmed appointment with Dr. Truett Perna for 05/08/12.  Contact names and phone numbers were provided.

## 2012-04-20 NOTE — Telephone Encounter (Signed)
C/D 04/20/12 for appt,05/08/12

## 2012-04-20 NOTE — Telephone Encounter (Signed)
Pt scheduled per Gina.  °

## 2012-05-03 ENCOUNTER — Encounter (INDEPENDENT_AMBULATORY_CARE_PROVIDER_SITE_OTHER): Payer: Self-pay | Admitting: Surgery

## 2012-05-03 ENCOUNTER — Ambulatory Visit (INDEPENDENT_AMBULATORY_CARE_PROVIDER_SITE_OTHER): Payer: Medicare PPO | Admitting: Surgery

## 2012-05-03 VITALS — BP 134/60 | HR 84 | Temp 97.3°F | Resp 20 | Ht 69.0 in | Wt 221.0 lb

## 2012-05-03 DIAGNOSIS — C189 Malignant neoplasm of colon, unspecified: Secondary | ICD-10-CM

## 2012-05-03 NOTE — Progress Notes (Signed)
Subjective:     Patient ID: Lisa Osborne, female   DOB: Jun 10, 1940, 72 y.o.   MRN: [id number]  HPI  Lisa Osborne  May 09, 1940 [id number]  Patient Care Team: Eustaquio Boyden, MD as PCP - General (Family Medicine) Ardeth Sportsman, MD as Consulting Physician (General Surgery) Louis Meckel, MD as Consulting Physician (Gastroenterology) Cira Rue, RN as Registered Nurse (Oncology) Ladene Artist, MD as Attending Physician (Medical Oncology)  This patient is a 72 y.o.female who presents today for surgical evaluation   POST-OPERATIVE DIAGNOSIS: cancer at rectosigmoid junction   PROCEDURE: Procedure(s):  LAPAROSCOPIC LOW ANTERIOR RESECTION  Mobilization of Splenic flexure of colon  Rigid proctoscopy  Lysis of adhesions   The patient comes in today feeling well.  Walking an hour a day.  Appetite not normal but finishing most meals and drinking Ensure bottle 1-2 a day.  Some dizziness with walking but not worsening.  No fevers or chills.  Urinating fine.  On no blood pressure meds.  Taking iron for anemia.  Having daily bowel movements.  Soreness going down on incisions.  Sleeping at night.  Down to just oxycodone at bedtime only.  Patient Active Problem List  Diagnosis  . IDA (iron deficiency anemia)  . Depression  . Diabetes mellitus type 2, controlled  . GERD (gastroesophageal reflux disease)  . Hypothyroidism  . Urge incontinence  . Blood in stool  . Medicare annual wellness visit, initial  . Colon adenocarcinoma - rectosigmoid T3N1 (1/15) s/p lap LAR 04/14/2012    Past Medical History  Diagnosis Date  . History of iron deficiency anemia   . Arthritis     left side lower back pain  . History of chicken pox   . Depression     severe in past  . Diabetes mellitus type 2, controlled 2006    diet controlled  . GERD (gastroesophageal reflux disease)     bad if off PPI  . Seasonal allergies   . Hypothyroidism     mild  . Urine incontinence     mild, wears  pad  . Unilateral deafnesses     left ear deaf, wears hearing aid right side  . Rosacea   . Anemia   . Colon adenocarcinoma - rectosigmoid T3N1 (1/15) s/p lap LAR 04/14/2012 04/14/2012    Status post laparoscopic lower anterior resection    Past Surgical History  Procedure Laterality Date  . Total vaginal hysterectomy  1985    cancer cells on cervix  . Abdominoplasty  1996  . Incontinence surgery  1996    bladder sling  . Colonoscopy  08/2006    normal  . Facelift  05/2011  . Abdominal hysterectomy  1985    total  . Laparoscopic low anterior resection  03/2012    colon cancer at rectosigmoid junction ([facility])  . Laparoscopic low anterior resection N/A 04/14/2012    Procedure: LAPAROSCOPIC LOW ANTERIOR RESECTION  rigid proctoscopy splenic flexure mobilization lysis of adhesion;  Surgeon: Ardeth Sportsman, MD;  Location: [facility];  Service: General;  Laterality: N/A;    History   Social History  . Marital Status: Divorced    Spouse Name: N/A    Number of Children: 1  . Years of Education: N/A   Occupational History  . Retired Runner, broadcasting/film/video    Social History Main Topics  . Smoking status: Former Smoker    Quit date: 04/06/1967  . Smokeless tobacco: Not on file  . Alcohol Use: Yes  Comment: seldom  . Drug Use: No  . Sexually Active: Not on file   Other Topics Concern  . Not on file   Social History Narrative   Caffeine: 1 cup coffee/day   Lives alone.  1 daughter Victorino Dike), 3 stillborns   Occupation: retired, was Air traffic controller.   Activity: no regular activity but does park far away, has treadmill at home   Diet: good fruits/vegetables, more fruit/vegetable smoothies  good amt water    Family History  Problem Relation Age of Onset  . Breast cancer Mother 37    breast cancer then multiple myeloma  . Cancer Mother     breast  . Alcohol abuse Father   . Heart disease Father     heart failure  . Mental illness Brother     2 brothers severe depression, suicide x2  .  Diabetes Maternal Aunt   . Diabetes Maternal Uncle   . Diabetes Paternal Uncle   . Diabetes Paternal Grandfather   . Coronary artery disease Neg Hx   . Stroke Neg Hx     Current Outpatient Prescriptions  Medication Sig Dispense Refill  . calcium gluconate 500 MG tablet Take 500 mg by mouth daily.        . cholecalciferol (VITAMIN D) 1000 UNITS tablet Take 1,000 Units by mouth daily.        . diphenhydrAMINE (SOMINEX) 25 MG tablet Take 25 mg by mouth at bedtime as needed for sleep.      . ferrous sulfate 325 (65 FE) MG tablet Take 325 mg by mouth daily with breakfast.      . lansoprazole (PREVACID) 15 MG capsule Take 15 mg by mouth daily.       Marland Kitchen levothyroxine (SYNTHROID, LEVOTHROID) 25 MCG tablet Take 25 mcg by mouth daily before breakfast.      . Multiple Vitamin (MULTIVITAMIN) tablet Take 1 tablet by mouth daily.        Marland Kitchen oxyCODONE (OXY IR/ROXICODONE) 5 MG immediate release tablet Take 1-2 tablets (5-10 mg total) by mouth every 4 (four) hours as needed for pain.  50 tablet  0  . sertraline (ZOLOFT) 50 MG tablet Take 50 mg by mouth daily before breakfast.      . vitamin B-12 (CYANOCOBALAMIN) 100 MCG tablet Take 100 mcg by mouth daily.       . [DISCONTINUED] oxybutynin (DITROPAN) 5 MG tablet Take 1 tablet (5 mg total) by mouth 3 (three) times daily as needed.  60 tablet  0   No current facility-administered medications for this visit.     Allergies  Allergen Reactions  . Latex     Mild reaction  . Nickel     Slight itch  . Shrimp (Shellfish Allergy)     Slight allergy - itching    BP 134/60  Pulse 84  Temp(Src) 97.3 F (36.3 C) (Temporal)  Resp 20  Ht 5\' 9"  (1.753 m)  Wt 221 lb (100.245 kg)  BMI 32.62 kg/m2  No results found.   Review of Systems  Constitutional: Negative for fever, chills and diaphoresis.  HENT: Negative for ear pain, sore throat and trouble swallowing.   Eyes: Negative for photophobia and visual disturbance.  Respiratory: Negative for cough and  choking.   Cardiovascular: Negative for chest pain and palpitations.  Gastrointestinal: Negative for nausea, vomiting, abdominal pain, diarrhea, constipation, anal bleeding and rectal pain.  Genitourinary: Negative for dysuria, frequency and difficulty urinating.  Musculoskeletal: Negative for myalgias and gait problem.  Skin: Negative  for color change, pallor and rash.  Neurological: Negative for dizziness, speech difficulty, weakness and numbness.  Hematological: Negative for adenopathy.  Psychiatric/Behavioral: Negative for confusion and agitation. The patient is not nervous/anxious.        Objective:   Physical Exam  Constitutional: She is oriented to person, place, and time. She appears well-developed and well-nourished. No distress.  HENT:  Head: Normocephalic.  Mouth/Throat: Oropharynx is clear and moist. No oropharyngeal exudate.  Eyes: Conjunctivae and EOM are normal. Pupils are equal, round, and reactive to light. No scleral icterus.  Neck: Normal range of motion. No tracheal deviation present.  Cardiovascular: Normal rate and intact distal pulses.   Pulmonary/Chest: Effort normal. No respiratory distress. She exhibits no tenderness.  Abdominal: Soft. She exhibits no distension. There is no tenderness. Hernia confirmed negative in the right inguinal area and confirmed negative in the left inguinal area.  Incisions clean with normal healing ridges.  No hernias  Genitourinary: No vaginal discharge found.  Musculoskeletal: Normal range of motion. She exhibits no tenderness.  Lymphadenopathy:       Right: No inguinal adenopathy present.       Left: No inguinal adenopathy present.  Neurological: She is alert and oriented to person, place, and time. No cranial nerve deficit. She exhibits normal muscle tone. Coordination normal.  Skin: Skin is warm and dry. No rash noted. She is not diaphoretic.  Psychiatric: She has a normal mood and affect. Her behavior is normal.        Assessment:     Recovering rather well two half weeks status post laparoscopic assisted low anterior resection for stage III rectal cancer with one lymph node positive     Plan:     Increase activity as tolerated to regular activity.  Low impact exercise such as walking an hour a day at least ideal.  Do not push through pain.  Dizziness should gradually resolve.  It worsens, discuss with primary care physician.  Would recheck hemoglobin to make sure not severely anemic.  Blood pressure normal today.  Neck tachycardic.  Not toxic.  He is like it is getting better.  Will follow  Diet as tolerated.  Low fat high fiber diet ideal.  Bowel regimen with 30 g fiber a day and fiber supplement as needed to avoid problems.  Keep appointment with medical oncology.  We have discussed her at GI tumor conference.  She is due to see Dr. Truett Perna 05/08/2012.  Standard of care would be post adjuvant chemotherapy.  Tumor probably too proximal to benefit from post adjuvant radiation.  See what Dr. Myrle Sheng thinks. Given her age and other health issues, not an absolute recommendation.  She will think about things and discuss with him.  Return to clinic 1 month, otherwise as needed.   Instructions discussed.  Followup with primary care physician for other health issues as would normally be done.  Questions answered.  The patient expressed understanding and appreciation

## 2012-05-03 NOTE — Patient Instructions (Addendum)
Colorectal Cancer Colorectal cancer is an abnormal growth of tissue (tumor) in the colon or rectum that is cancerous (malignant). Unlike noncancerous (benign) tumors, malignant tumors can spread to other parts of your body. The colon is the large bowel or large intestine. The rectum is the last several inches of the colon. CAUSES  The exact cause of colon cancer is unknown.  RISK FACTORS The majority of patients do not have identifiable risk factors, but the following factors may increase your chances of getting colon cancer:  Age. Most colorectal cancers occur in people older than 50 years.  Having abnormal growths (polyps) on the inner wall of the colon or rectum.  Diabetes.  Being African American.  Family history of hereditary nonpolyposis colon cancer. This condition is caused by changes in the genes that are responsible for repairing mismatched DNA.  Family history of familial adenomatous polyposis (FAP). This is a rare, inherited condition in which hundreds of polyps form in the colon and rectum. It is caused by a change in the APC gene. Unless FAP is treated, it usually leads to colorectal cancer by age 68.  Personal history of cancer. A person who has already had colorectal cancer may develop it a second time. Also, women with a history of ovarian, uterine, or breast cancer are at a somewhat higher risk of developing colorectal cancer.  Inflammatory bowel disease, including ulcerative colitis and Crohn's disease.  Being obese or eating a diet that is high in fat (especially animal fat) and low in fiber, fruits, and vegetables.  Smoking. A person who smokes cigarettes may be at increased risk of developing polyps and colorectal cancer.  Heavy alcohol use. SYMPTOMS Early colorectal cancer often does not cause symptoms. As the cancer grows, symptoms may include:  Diarrhea.  Constipation.  Feeling like the bowel does not empty completely after a bowel movement.  Blood in the  stool.  Stools that are narrower than usual.  Abdominal discomfort, pain, bloating, fullness, or cramps.  Unexplained weight loss.  Constant tiredness.  Nausea and vomiting. DIAGNOSIS  Your caregiver will ask about your medical history. He or she may also perform a number of procedures, such as:  A physical exam.  Blood tests. This may include a routine complete blood count and iron level testing. Your caregiver may also check for carcinoembryonic antigen (CEA) and other substances in the blood. Some people who have colorectal cancer have a high CEA level. These levels may be used to follow the activity of your colon cancer.  Chest X-rays, computed tomography (CT) scans, or magnetic resonance imaging (MRI).  Taking a tissue sample (biopsy) from the colon or rectum. The sample is examined under a microscope to look for cancer cells.  Sigmoidoscopy. With this test, your caregiver can see inside your colon. A thin flexible tube (sigmoidoscope) is placed into your rectum. This device has a light source and a tiny video camera in it. Your caregiver uses the sigmoidoscope to look at the last third of your colon.  Colonoscopy. This test is like sigmoidoscopy, but your caregiver looks at the entire colon. This test usually requires medicine that helps you relax (sedative).  Endorectal ultrasound. With this test, your caregiver can see how deep a rectal tumor has grown and whether the cancer has spread to lymph nodes or other nearby tissues. A tool (probe) is inserted into the rectum. The probe sends out sound waves to the rectum and nearby tissues, and a computer uses the echoes to create a picture.  Your cancer will be staged to determine its severity and extent. Staging is a careful attempt to find out the size of the tumor, whether the cancer has spread, and if so, to what parts of the body. You may need to have more tests to determine the stage of your cancer. The test results will help  determine what treatment plan is best for you. STAGES  Stage 0. The cancer is found only in the innermost lining of the colon or rectum.  Stage I. The cancer has grown into the inner wall of the colon or rectum. The cancer has not yet reached the outer wall of the colon.  Stage II. The cancer extends more deeply into or through the wall of the colon or rectum. It may have invaded nearby tissue, but cancer cells have not spread to the lymph nodes.  Stage III. The cancer has spread to nearby lymph nodes but not to other parts of the body.  Stage IV. The cancer has spread to other parts of the body, such as the liver or lungs. Your caregiver may tell you the detailed stage of your cancer. In that case, the stage will include both a number and a letter. TREATMENT  Depending on the type and stage, colorectal cancer may be treated with surgery, radiation therapy, or chemotherapy. Some patients have a combination of these therapies.  Surgery may be done to remove the polyps from your colon. In early stages, your caregiver may be able to do this during a colonoscopy. In later stages, surgery may be done to remove part of your colon.  Radiation therapy uses high-energy rays to kill cancer cells. This is usually recommended for patients with rectal cancer.  Chemotherapy is the use of drugs to kill cancer cells. Caregivers also give chemotherapy to help reduce pain and other problems caused by colorectal cancer. This may be done even if the cancer is not curable. HOME CARE INSTRUCTIONS   Only take over-the-counter or prescription medicines for pain, discomfort, or fever as directed by your caregiver.  Maintain a healthy diet.  Consider joining a support group. This may help you learn to cope with the stress of having colorectal cancer.  Seek advice to help you manage treatment side effects.  Keep all follow-up appointments as directed by your caregiver.  Inform your cancer specialist if you are  admitted to the hospital. SEEK IMMEDIATE MEDICAL CARE IF:   Your diarrhea or constipation does not go away.  You have alternating constipation and diarrhea.  You have blood in your stools.  Your abdominal pain gets worse.  You lose weight without trying.  You notice new fatigue or weakness.  You develop a fever during chemotherapy treatment. Document Released: 01/11/2005 Document Revised: 04/05/2011 Document Reviewed: 12/29/2010 [facility] Patient Information 2013 [place], [place].  GETTING TO GOOD BOWEL HEALTH. Irregular bowel habits such as constipation and diarrhea can lead to many problems over time.  Having one soft bowel movement a day is the most important way to prevent further problems.  The anorectal canal is designed to handle stretching and feces to safely manage our ability to get rid of solid waste (feces, poop, stool) out of our body.  BUT, hard constipated stools can act like ripping concrete bricks and diarrhea can be a burning fire to this very sensitive area of our body, causing inflamed hemorrhoids, anal fissures, increasing risk is perirectal abscesses, abdominal pain/bloating, an making irritable bowel worse.     The goal: ONE SOFT BOWEL MOVEMENT  A DAY!  To have soft, regular bowel movements:    Drink at least 8 tall glasses of water a day.     Take plenty of fiber.  Fiber is the undigested part of plant food that passes into the colon, acting s "natures broom" to encourage bowel motility and movement.  Fiber can absorb and hold large amounts of water. This results in a larger, bulkier stool, which is soft and easier to pass. Work gradually over several weeks up to 6 servings a day of fiber (25g a day even more if needed) in the form of: o Vegetables -- Root (potatoes, carrots, turnips), leafy green (lettuce, salad greens, celery, spinach), or cooked high residue (cabbage, broccoli, etc) o Fruit -- Fresh (unpeeled skin & pulp), Dried (prunes, apricots, cherries, etc ),  or  stewed ( applesauce)  o Whole grain breads, pasta, etc (whole wheat)  o Bran cereals    Bulking Agents -- This type of water-retaining fiber generally is easily obtained each day by one of the following:  o Psyllium bran -- The psyllium plant is remarkable because its ground seeds can retain so much water. This product is available as Metamucil, Konsyl, Effersyllium, Per Diem Fiber, or the less expensive generic preparation in drug and health food stores. Although labeled a laxative, it really is not a laxative.  o Methylcellulose -- This is another fiber derived from wood which also retains water. It is available as Citrucel. o Polyethylene Glycol - and "artificial" fiber commonly called Miralax or Glycolax.  It is helpful for people with gassy or bloated feelings with regular fiber o Flax Seed - a less gassy fiber than psyllium   No reading or other relaxing activity while on the toilet. If bowel movements take longer than 5 minutes, you are too constipated   AVOID CONSTIPATION.  High fiber and water intake usually takes care of this.  Sometimes a laxative is needed to stimulate more frequent bowel movements, but    Laxatives are not a good long-term solution as it can wear the colon out. o Osmotics (Milk of Magnesia, Fleets phosphosoda, Magnesium citrate, MiraLax, GoLytely) are safer than  o Stimulants (Senokot, Castor Oil, Dulcolax, Ex Lax)    o Do not take laxatives for more than 7days in a row.    IF SEVERELY CONSTIPATED, try a Bowel Retraining Program: o Do not use laxatives.  o Eat a diet high in roughage, such as bran cereals and leafy vegetables.  o Drink six (6) ounces of prune or apricot juice each morning.  o Eat two (2) large servings of stewed fruit each day.  o Take one (1) heaping tablespoon of a psyllium-based bulking agent twice a day. Use sugar-free sweetener when possible to avoid excessive calories.  o Eat a normal breakfast.  o Set aside 15 minutes after breakfast to sit  on the toilet, but do not strain to have a bowel movement.  o If you do not have a bowel movement by the third day, use an enema and repeat the above steps.    Controlling diarrhea o Switch to liquids and simpler foods for a few days to avoid stressing your intestines further. o Avoid dairy products (especially milk & ice cream) for a short time.  The intestines often can lose the ability to digest lactose when stressed. o Avoid foods that cause gassiness or bloating.  Typical foods include beans and other legumes, cabbage, broccoli, and dairy foods.  Every person has some sensitivity to  other foods, so listen to our body and avoid those foods that trigger problems for you. o Adding fiber (Citrucel, Metamucil, psyllium, Miralax) gradually can help thicken stools by absorbing excess fluid and retrain the intestines to act more normally.  Slowly increase the dose over a few weeks.  Too much fiber too soon can backfire and cause cramping & bloating. o Probiotics (such as active yogurt, Align, etc) may help repopulate the intestines and colon with normal bacteria and calm down a sensitive digestive tract.  Most studies show it to be of mild help, though, and such products can be costly. o Medicines:   Bismuth subsalicylate (ex. Kayopectate, Pepto Bismol) every 30 minutes for up to 6 doses can help control diarrhea.  Avoid if pregnant.   Loperamide (Immodium) can slow down diarrhea.  Start with two tablets (4mg  total) first and then try one tablet every 6 hours.  Avoid if you are having fevers or severe pain.  If you are not better or start feeling worse, stop all medicines and call your doctor for advice o Call your doctor if you are getting worse or not better.  Sometimes further testing (cultures, endoscopy, X-ray studies, bloodwork, etc) may be needed to help diagnose and treat the cause of the diarrhea.  Dizziness Dizziness is a common problem. It is a feeling of unsteadiness or lightheadedness. You  may feel like you are about to faint. Dizziness can lead to injury if you stumble or fall. A person of any age group can suffer from dizziness, but dizziness is more common in older adults. CAUSES  Dizziness can be caused by many different things, including:  Middle ear problems.  Standing for too long.  Infections.  An allergic reaction.  Aging.  An emotional response to something, such as the sight of blood.  Side effects of medicines.  Fatigue.  Problems with circulation or blood pressure.  Excess use of alcohol, medicines, or illegal drug use.  Breathing too fast (hyperventilation).  An arrhythmia or problems with your heart rhythm.  Low red blood cell count (anemia).  Pregnancy.  Vomiting, diarrhea, fever, or other illnesses that cause dehydration.  Diseases or conditions such as Parkinson's disease, high blood pressure (hypertension), diabetes, and thyroid problems.  Exposure to extreme heat. DIAGNOSIS  To find the cause of your dizziness, your caregiver may do a physical exam, lab tests, radiologic imaging scans, or an electrocardiography test (ECG).  TREATMENT  Treatment of dizziness depends on the cause of your symptoms and can vary greatly. HOME CARE INSTRUCTIONS   Drink enough fluids to keep your urine clear or pale yellow. This is especially important in very hot weather. In the elderly, it is also important in cold weather.  If your dizziness is caused by medicines, take them exactly as directed. When taking blood pressure medicines, it is especially important to get up slowly.  Rise slowly from chairs and steady yourself until you feel okay.  In the morning, first sit up on the side of the bed. When this seems okay, stand slowly while holding onto something until you know your balance is fine.  If you need to stand in one place for a long time, be sure to move your legs often. Tighten and relax the muscles in your legs while standing.  If dizziness  continues to be a problem, have someone stay with you for a day or two. Do this until you feel you are well enough to stay alone. Have the person call your  caregiver if he or she notices changes in you that are concerning.  Do not drive or use heavy machinery if you feel dizzy.  Do not drink alcohol. SEEK IMMEDIATE MEDICAL CARE IF:   Your dizziness or lightheadedness gets worse.  You feel nauseous or vomit.  You develop problems with talking, walking, weakness, or using your arms, hands, or legs.  You are not thinking clearly or you have difficulty forming sentences. It may take a friend or family member to determine if your thinking is normal.  You develop chest pain, abdominal pain, shortness of breath, or sweating.  Your vision changes.  You notice any bleeding.  You have side effects from medicine that seems to be getting worse rather than better. MAKE SURE YOU:   Understand these instructions.  Will watch your condition.  Will get help right away if you are not doing well or get worse. Document Released: 07/07/2000 Document Revised: 04/05/2011 Document Reviewed: 07/31/2010 [facility] Patient Information 2013 [place], [place].

## 2012-05-08 ENCOUNTER — Ambulatory Visit (HOSPITAL_BASED_OUTPATIENT_CLINIC_OR_DEPARTMENT_OTHER): Payer: Medicare PPO | Admitting: Oncology

## 2012-05-08 ENCOUNTER — Encounter: Payer: Self-pay | Admitting: Oncology

## 2012-05-08 ENCOUNTER — Other Ambulatory Visit: Payer: Self-pay | Admitting: *Deleted

## 2012-05-08 ENCOUNTER — Telehealth: Payer: Self-pay | Admitting: Oncology

## 2012-05-08 ENCOUNTER — Ambulatory Visit: Payer: Medicare PPO

## 2012-05-08 VITALS — BP 140/77 | HR 80 | Temp 97.4°F | Resp 18 | Ht 69.0 in | Wt 223.3 lb

## 2012-05-08 DIAGNOSIS — C19 Malignant neoplasm of rectosigmoid junction: Secondary | ICD-10-CM

## 2012-05-08 DIAGNOSIS — C189 Malignant neoplasm of colon, unspecified: Secondary | ICD-10-CM

## 2012-05-08 DIAGNOSIS — E119 Type 2 diabetes mellitus without complications: Secondary | ICD-10-CM

## 2012-05-08 NOTE — Progress Notes (Signed)
[facility] New Patient Consult   Referring MD: Lisa Osborne 72 y.o.  17-Jan-1941    Reason for Referral: Colon cancer     HPI: She developed abdominal cramping beginning in August of 2013. She also noted rectal bleeding. She was referred to Dr. Arlyce Osborne and underwent a colonoscopy 03/07/2012. In the sigmoid colon at approximately 20 cm from the anus a circumferential friable mass was noted. A sessile polyp was seen in the descending colon. The colon was otherwise normal. The pathology revealed a tubular adenoma at the descending colon polyp and the sigmoid mass biopsy revealed adenocarcinoma.  Staging CT scans of the chest, abdomen, and pelvis on 03/15/2012 revealed no evidence of metastatic disease to the chest the fluid attenuation structures in the left liver were felt to represent cysts. No enlarged upper abdominal lymph nodes. Circumferential mass like wall thickening was noted at the sigmoid colon with an adjacent enlarged lymph node.  She was referred to Dr. Michaell Osborne and was taken to the operating room for a laparoscopic assisted low anterior resection on 04/14/2012. Resection was performed at the descending/sigmoid colon and mid rectum. A mass was noted at the rectosigmoid junction going into the proximal rectum. The final anastomosis was at 12 cm from the anal verge. No evidence of metastatic disease.  The pathology ([id number]) confirmed invasive adenocarcinoma extending through the muscular propria into the pericolonic tissue and involving the serosa. No evidence of angiolymphatic or perineural invasion. The resection margins were negative. One of 15 lymph nodes was positive for metastatic carcinoma. The distal resection margin was measured as 8 cm. The tumor returned microsatellite stable by PCR and no loss of expression of the mismatch repair genes was noted.  She reports improvement in the abdominal pain since undergoing surgery. She continues to  take oxycodone in the evening  Past Medical History  Diagnosis Date  . History of iron deficiency anemia   . Arthritis     left side lower back pain, knees   . History of chicken pox   . Depression     severe in past  . Diabetes mellitus type 2, controlled 2006    diet controlled  . GERD (gastroesophageal reflux disease)     bad if off PPI  . Seasonal allergies   . Hypothyroidism     mild  . Urine incontinence     mild, wears pad  . Unilateral deafnesses     left ear deaf, wears hearing aid right side  . Rosacea       . Colon adenocarcinoma - rectosigmoid T4aN1 (1/15) s/p lap LAR 04/14/2012 04/14/2012    Status post laparoscopic lower anterior resection    Past Surgical History  Procedure Laterality Date  . Total vaginal hysterectomy  1985    cancer cells on cervix  . Abdominoplasty  1996  . Incontinence surgery  1996    bladder sling  . Colonoscopy  08/2006    normal  . Facelift  05/2011  . Abdominal hysterectomy  1985    total  . Laparoscopic low anterior resection N/A 04/14/2012    colon cancer at rectosigmoid junction s/p rigid proctoscopy splenic flexure mobilization lysis of adhesion;  Surgeon: Ardeth Sportsman, MD;     Family History  Problem Relation Age of Onset  . Breast cancer Mother 82    breast cancer then multiple myeloma  .  polyps   father        . Alcohol abuse Father   .  Heart disease Father     heart failure  . Mental illness Brother     2 brothers severe depression, suicide x2  . Diabetes Maternal Aunt   . Diabetes Maternal Uncle   . Diabetes Paternal Uncle   . Diabetes Paternal Grandfather   . Coronary artery disease Neg Hx   . Stroke Neg Hx     Current outpatient prescriptions:calcium gluconate 500 MG tablet, Take 500 mg by mouth daily.  , Disp: , Rfl: ;  cholecalciferol (VITAMIN D) 1000 UNITS tablet, Take 1,000 Units by mouth daily.  , Disp: , Rfl: ;  diphenhydrAMINE (SOMINEX) 25 MG tablet, Take 25 mg by mouth at bedtime as needed for  sleep., Disp: , Rfl: ;  ferrous sulfate 325 (65 FE) MG tablet, Take 325 mg by mouth daily with breakfast., Disp: , Rfl:  lansoprazole (PREVACID) 15 MG capsule, Take 15 mg by mouth daily. , Disp: , Rfl: ;  levothyroxine (SYNTHROID, LEVOTHROID) 25 MCG tablet, Take 25 mcg by mouth daily before breakfast., Disp: , Rfl: ;  Multiple Vitamin (MULTIVITAMIN) tablet, Take 1 tablet by mouth daily.  , Disp: , Rfl:  oxyCODONE (OXY IR/ROXICODONE) 5 MG immediate release tablet, Take 1-2 tablets (5-10 mg total) by mouth every 4 (four) hours as needed for pain., Disp: 50 tablet, Rfl: 0;  sertraline (ZOLOFT) 50 MG tablet, Take 50 mg by mouth daily before breakfast., Disp: , Rfl: ;  vitamin B-12 (CYANOCOBALAMIN) 100 MCG tablet, Take 100 mcg by mouth daily. , Disp: , Rfl:  [DISCONTINUED] oxybutynin (DITROPAN) 5 MG tablet, Take 1 tablet (5 mg total) by mouth 3 (three) times daily as needed., Disp: 60 tablet, Rfl: 0  Allergies:  Allergies  Allergen Reactions  . Latex     Mild reaction  . Nickel     Slight itch  . Shrimp (Shellfish Allergy)     Slight allergy - itching    Social History: She lives in [place]. She is a retired high school Retail buyer. She does not smoke cigarettes. She has 2 drinks per month. No transfusion history. No risk factor for HIV or hepatitis.    History  Alcohol Use  . Yes    Comment: seldom    History  Smoking status  . Former Smoker  . Quit date: 04/06/1967  Smokeless tobacco  . Not on file     ROS:   Positives include: Rectal bleeding and cramping abdominal pain prior to surgery, "dizziness "with getting up last week  A complete ROS was otherwise negative.  Physical Exam:  Blood pressure 140/77, pulse 80, temperature 97.4 F (36.3 C), temperature source Oral, resp. rate 18, height 5\' 9"  (1.753 m), weight 223 lb 4.8 oz (101.288 kg).  HEENT: Oropharynx without visible mass, neck without mass Lungs: Clear bilaterally Cardiac: Regular rate and  rhythm Abdomen: No hepatomegaly, nontender, no mass, healed surgical incisions  Vascular: No leg edema Lymph nodes: No cervical, supraclavicular, axillary, or inguinal nodes Neurologic: Alert and oriented, the motor exam appears intact in the upper and lower extremities Skin: No rash   LAB:  CBC  Lab Results  Component Value Date   WBC 8.4 04/15/2012   HGB 10.1* 04/15/2012   HCT 30.2* 04/15/2012   MCV 89.6 04/15/2012   PLT 228 04/15/2012     CMP      Component Value Date/Time   NA 136 04/15/2012 0555   NA 142 04/01/2009   K 4.7 04/15/2012 0555   K 5.7 04/01/2009   CL  100 04/15/2012 0555   CL 105 04/01/2009   CO2 27 04/15/2012 0555   GLUCOSE 144* 04/15/2012 0555   BUN 10 04/15/2012 0555   BUN 21 04/01/2009   CREATININE 0.92 04/15/2012 0555   CREATININE 0.97 04/01/2009   CALCIUM 8.9 04/15/2012 0555   CALCIUM 9.6 04/01/2009   PROT 7.7 03/07/2012 1554   ALBUMIN 4.0 03/07/2012 1554   AST 24 03/07/2012 1554   AST 22 04/01/2009   ALT 12 03/07/2012 1554   ALKPHOS 71 03/07/2012 1554   ALKPHOS 82 04/01/2009   BILITOT 0.5 03/07/2012 1554   BILITOT 0.7 04/01/2009   GFRNONAA 62* 04/15/2012 0555   GFRAA 71* 04/15/2012 0555   CEA on 03/07/2012-6.5  Radiology: As per history of present illness    Assessment/Plan:   1. Moderately differentiated adenocarcinoma of the rectosigmoid colon, microsatellite stable, stage IIIB (T4 N1), status post a low anterior resection on 04/14/2012  2. Elevated preoperative CEA  3. Diabetes   Disposition:   She has been diagnosed with stage III colon cancer. I discussed the diagnosis, prognosis, and adjuvant treatment options with Lisa Osborne today. We reviewed the details of the surgical pathology report.  She has a significant risk of developing recurrent colon cancer over the next several years. I reviewed the data confirming a benefit with adjuvant systemic therapy in this setting. We discussed the disease-free/survival benefit associated with 5-fluorouracil. We  discussed the incremental benefit associated with the addition of oxaliplatin. I reviewed the potential toxicities associated with 5-fluorouracil and oxaliplatin.  She is concerned about the potential of neuropathy with oxaliplatin. Lisa Osborne decided against adjuvant oxaliplatin.  I recommend adjuvant capecitabine. We reviewed the potential toxicities associated with capecitabine including the chance for mucositis, diarrhea, and hematologic toxicity. We discussed the skin rash, hyperpigmentation, and hand/foot syndrome associated with capecitabine. She was given reading materials on capecitabine and will attend a chemotherapy teaching class. The plan is to begin a first cycle of adjuvant capecitabine beginning on 05/15/2012.  We also discussed the indication for adjuvant radiation. The tumor appeared to involve the high rectum, but measured at 20 cm on the initial colonoscopy. Her case was presented at the GI tumor conference several weeks ago and the radiation oncologist felt there was no indication for radiation. We will reconfirm this with radiation oncology.  She will return for an office visit and nadir CBC on 06/01/2012.  Lisa Osborne 05/08/2012, 5:43 PM

## 2012-05-08 NOTE — Progress Notes (Signed)
Checked in new pt with no financial concerns. °

## 2012-05-08 NOTE — Progress Notes (Signed)
Met with patient and explained role of GI navigator.  Educational information provided on colorectal cancer and Xeloda.  Referral made to dietician for education.  Patient declined need for SW or other services at this time.  Referral made to pharmacy for "Xeloda" monitoring.  Contact names and phone numbers for [facility] team were provided.  Patient was without questions.  Will continue to follow as needed.

## 2012-05-08 NOTE — Telephone Encounter (Signed)
gv and printed appt sched and avs for pt  °

## 2012-05-09 ENCOUNTER — Encounter: Payer: Self-pay | Admitting: Oncology

## 2012-05-09 ENCOUNTER — Other Ambulatory Visit: Payer: Self-pay | Admitting: *Deleted

## 2012-05-09 ENCOUNTER — Encounter (INDEPENDENT_AMBULATORY_CARE_PROVIDER_SITE_OTHER): Payer: Self-pay

## 2012-05-09 MED ORDER — CAPECITABINE 500 MG PO TABS: ORAL_TABLET | ORAL | Status: DC

## 2012-05-09 NOTE — Telephone Encounter (Signed)
Copy of Xeloda script given to [facility] 05/09/12.

## 2012-05-09 NOTE — Progress Notes (Signed)
Faxed xeloda prescription to [facility] °

## 2012-05-10 ENCOUNTER — Encounter: Payer: Self-pay | Admitting: Oncology

## 2012-05-10 NOTE — Progress Notes (Signed)
Patient's insurance requires she uses [facility], [facility] faxed xeloda prescription to them @ [phone] phone # [phone].

## 2012-05-15 ENCOUNTER — Encounter: Payer: Self-pay | Admitting: Pharmacist

## 2012-05-15 NOTE — Telephone Encounter (Signed)
[facility] called reporting they received Xeloda order from an outside source that was not the physician and need clarification to complete this order.  Pharmacist Renae Fickle read script to me which is correct.  Paul asked for the quantity and rest time. Informed him patient needs 112 xeloda 500 mg for the fourteen day cycle and is to rest seven days off.

## 2012-05-16 ENCOUNTER — Telehealth: Payer: Self-pay | Admitting: Dietician

## 2012-05-17 ENCOUNTER — Ambulatory Visit: Payer: Medicare PPO

## 2012-05-17 ENCOUNTER — Encounter: Payer: Self-pay | Admitting: *Deleted

## 2012-05-26 ENCOUNTER — Telehealth: Payer: Self-pay | Admitting: *Deleted

## 2012-05-26 ENCOUNTER — Other Ambulatory Visit: Payer: Self-pay | Admitting: *Deleted

## 2012-05-26 DIAGNOSIS — R197 Diarrhea, unspecified: Secondary | ICD-10-CM

## 2012-05-26 DIAGNOSIS — R112 Nausea with vomiting, unspecified: Secondary | ICD-10-CM

## 2012-05-26 DIAGNOSIS — C189 Malignant neoplasm of colon, unspecified: Secondary | ICD-10-CM

## 2012-05-26 MED ORDER — ONDANSETRON HCL 8 MG PO TABS: 8.0000 mg | ORAL_TABLET | Freq: Two times a day (BID) | ORAL | Status: DC | PRN

## 2012-05-26 MED ORDER — LOPERAMIDE HCL 2 MG PO TABS: 2.0000 mg | ORAL_TABLET | Freq: Four times a day (QID) | ORAL | Status: DC | PRN

## 2012-05-26 MED ORDER — PROCHLORPERAZINE MALEATE 5 MG PO TABS: 5.0000 mg | ORAL_TABLET | Freq: Four times a day (QID) | ORAL | Status: DC | PRN

## 2012-05-26 NOTE — Telephone Encounter (Signed)
Patient called reporting she is on day 9 of xeloda and has no strength.  Having diarrhea and vomiting.  Vomits once a day and has three or more watery bowel movements.  Trying peppermint candies with no anti-nausea or anti-diarrheal being used.  Reports she drinks about 35 oz daily and trying to eat popsicles but no appetite.  Bladder function is normal.  Uses [facility] on [address] In [place].  Will notify providers.  Next f/u is 06-01-2012 with Ms. Maisie Fus NP.

## 2012-05-26 NOTE — Telephone Encounter (Signed)
Verbal order received and read back from Dr. Truett Perna for imodium, zofran and compazine.  Orders sent via Surescript at this time and called patient with these orders.  Reviewed ways to increase fluid intake asking that she stay hydrated.  Denies questions.

## 2012-05-29 ENCOUNTER — Encounter (INDEPENDENT_AMBULATORY_CARE_PROVIDER_SITE_OTHER): Payer: Self-pay | Admitting: Surgery

## 2012-05-29 ENCOUNTER — Ambulatory Visit (INDEPENDENT_AMBULATORY_CARE_PROVIDER_SITE_OTHER): Payer: Medicare PPO | Admitting: Surgery

## 2012-05-29 VITALS — BP 112/80 | HR 76 | Temp 98.4°F | Resp 18 | Ht 69.0 in | Wt 212.4 lb

## 2012-05-29 DIAGNOSIS — C189 Malignant neoplasm of colon, unspecified: Secondary | ICD-10-CM

## 2012-05-29 NOTE — Patient Instructions (Signed)
Colorectal Cancer Colorectal cancer is an abnormal growth of tissue (tumor) in the colon or rectum that is cancerous (malignant). Unlike noncancerous (benign) tumors, malignant tumors can spread to other parts of your body. The colon is the large bowel or large intestine. The rectum is the last several inches of the colon. CAUSES  The exact cause of colon cancer is unknown.  RISK FACTORS The majority of patients do not have identifiable risk factors, but the following factors may increase your chances of getting colon cancer:  Age. Most colorectal cancers occur in people older than 50 years.  Having abnormal growths (polyps) on the inner wall of the colon or rectum.  Diabetes.  Being African American.  Family history of hereditary nonpolyposis colon cancer. This condition is caused by changes in the genes that are responsible for repairing mismatched DNA.  Family history of familial adenomatous polyposis (FAP). This is a rare, inherited condition in which hundreds of polyps form in the colon and rectum. It is caused by a change in the APC gene. Unless FAP is treated, it usually leads to colorectal cancer by age 76.  Personal history of cancer. A person who has already had colorectal cancer may develop it a second time. Also, women with a history of ovarian, uterine, or breast cancer are at a somewhat higher risk of developing colorectal cancer.  Inflammatory bowel disease, including ulcerative colitis and Crohn's disease.  Being obese or eating a diet that is high in fat (especially animal fat) and low in fiber, fruits, and vegetables.  Smoking. A person who smokes cigarettes may be at increased risk of developing polyps and colorectal cancer.  Heavy alcohol use. SYMPTOMS Early colorectal cancer often does not cause symptoms. As the cancer grows, symptoms may include:  Diarrhea.  Constipation.  Feeling like the bowel does not empty completely after a bowel movement.  Blood in the  stool.  Stools that are narrower than usual.  Abdominal discomfort, pain, bloating, fullness, or cramps.  Unexplained weight loss.  Constant tiredness.  Nausea and vomiting. DIAGNOSIS  Your caregiver will ask about your medical history. He or she may also perform a number of procedures, such as:  A physical exam.  Blood tests. This may include a routine complete blood count and iron level testing. Your caregiver may also check for carcinoembryonic antigen (CEA) and other substances in the blood. Some people who have colorectal cancer have a high CEA level. These levels may be used to follow the activity of your colon cancer.  Chest X-rays, computed tomography (CT) scans, or magnetic resonance imaging (MRI).  Taking a tissue sample (biopsy) from the colon or rectum. The sample is examined under a microscope to look for cancer cells.  Sigmoidoscopy. With this test, your caregiver can see inside your colon. A thin flexible tube (sigmoidoscope) is placed into your rectum. This device has a light source and a tiny video camera in it. Your caregiver uses the sigmoidoscope to look at the last third of your colon.  Colonoscopy. This test is like sigmoidoscopy, but your caregiver looks at the entire colon. This test usually requires medicine that helps you relax (sedative).  Endorectal ultrasound. With this test, your caregiver can see how deep a rectal tumor has grown and whether the cancer has spread to lymph nodes or other nearby tissues. A tool (probe) is inserted into the rectum. The probe sends out sound waves to the rectum and nearby tissues, and a computer uses the echoes to create a picture.  Your cancer will be staged to determine its severity and extent. Staging is a careful attempt to find out the size of the tumor, whether the cancer has spread, and if so, to what parts of the body. You may need to have more tests to determine the stage of your cancer. The test results will help  determine what treatment plan is best for you. STAGES  Stage 0. The cancer is found only in the innermost lining of the colon or rectum.  Stage I. The cancer has grown into the inner wall of the colon or rectum. The cancer has not yet reached the outer wall of the colon.  Stage II. The cancer extends more deeply into or through the wall of the colon or rectum. It may have invaded nearby tissue, but cancer cells have not spread to the lymph nodes.  Stage III. The cancer has spread to nearby lymph nodes but not to other parts of the body.  Stage IV. The cancer has spread to other parts of the body, such as the liver or lungs. Your caregiver may tell you the detailed stage of your cancer. In that case, the stage will include both a number and a letter. TREATMENT  Depending on the type and stage, colorectal cancer may be treated with surgery, radiation therapy, or chemotherapy. Some patients have a combination of these therapies.  Surgery may be done to remove the polyps from your colon. In early stages, your caregiver may be able to do this during a colonoscopy. In later stages, surgery may be done to remove part of your colon.  Radiation therapy uses high-energy rays to kill cancer cells. This is usually recommended for patients with rectal cancer.  Chemotherapy is the use of drugs to kill cancer cells. Caregivers also give chemotherapy to help reduce pain and other problems caused by colorectal cancer. This may be done even if the cancer is not curable. HOME CARE INSTRUCTIONS   Only take over-the-counter or prescription medicines for pain, discomfort, or fever as directed by your caregiver.  Maintain a healthy diet.  Consider joining a support group. This may help you learn to cope with the stress of having colorectal cancer.  Seek advice to help you manage treatment side effects.  Keep all follow-up appointments as directed by your caregiver.  Inform your cancer specialist if you are  admitted to the hospital. SEEK IMMEDIATE MEDICAL CARE IF:   Your diarrhea or constipation does not go away.  You have alternating constipation and diarrhea.  You have blood in your stools.  Your abdominal pain gets worse.  You lose weight without trying.  You notice new fatigue or weakness.  You develop a fever during chemotherapy treatment. Document Released: 01/11/2005 Document Revised: 04/05/2011 Document Reviewed: 12/29/2010 [facility] Patient Information 2013 [place], [place].  GETTING TO GOOD BOWEL HEALTH. Irregular bowel habits such as constipation and diarrhea can lead to many problems over time.  Having one soft bowel movement a day is the most important way to prevent further problems.  The anorectal canal is designed to handle stretching and feces to safely manage our ability to get rid of solid waste (feces, poop, stool) out of our body.  BUT, hard constipated stools can act like ripping concrete bricks and diarrhea can be a burning fire to this very sensitive area of our body, causing inflamed hemorrhoids, anal fissures, increasing risk is perirectal abscesses, abdominal pain/bloating, an making irritable bowel worse.     The goal: ONE SOFT BOWEL MOVEMENT  A DAY!  To have soft, regular bowel movements:    Drink at least 8 tall glasses of water a day.     Take plenty of fiber.  Fiber is the undigested part of plant food that passes into the colon, acting s "natures broom" to encourage bowel motility and movement.  Fiber can absorb and hold large amounts of water. This results in a larger, bulkier stool, which is soft and easier to pass. Work gradually over several weeks up to 6 servings a day of fiber (25g a day even more if needed) in the form of: o Vegetables -- Root (potatoes, carrots, turnips), leafy green (lettuce, salad greens, celery, spinach), or cooked high residue (cabbage, broccoli, etc) o Fruit -- Fresh (unpeeled skin & pulp), Dried (prunes, apricots, cherries, etc ),  or  stewed ( applesauce)  o Whole grain breads, pasta, etc (whole wheat)  o Bran cereals    Bulking Agents -- This type of water-retaining fiber generally is easily obtained each day by one of the following:  o Psyllium bran -- The psyllium plant is remarkable because its ground seeds can retain so much water. This product is available as Metamucil, Konsyl, Effersyllium, Per Diem Fiber, or the less expensive generic preparation in drug and health food stores. Although labeled a laxative, it really is not a laxative.  o Methylcellulose -- This is another fiber derived from wood which also retains water. It is available as Citrucel. o Polyethylene Glycol - and "artificial" fiber commonly called Miralax or Glycolax.  It is helpful for people with gassy or bloated feelings with regular fiber o Flax Seed - a less gassy fiber than psyllium   No reading or other relaxing activity while on the toilet. If bowel movements take longer than 5 minutes, you are too constipated   AVOID CONSTIPATION.  High fiber and water intake usually takes care of this.  Sometimes a laxative is needed to stimulate more frequent bowel movements, but    Laxatives are not a good long-term solution as it can wear the colon out. o Osmotics (Milk of Magnesia, Fleets phosphosoda, Magnesium citrate, MiraLax, GoLytely) are safer than  o Stimulants (Senokot, Castor Oil, Dulcolax, Ex Lax)    o Do not take laxatives for more than 7days in a row.    IF SEVERELY CONSTIPATED, try a Bowel Retraining Program: o Do not use laxatives.  o Eat a diet high in roughage, such as bran cereals and leafy vegetables.  o Drink six (6) ounces of prune or apricot juice each morning.  o Eat two (2) large servings of stewed fruit each day.  o Take one (1) heaping tablespoon of a psyllium-based bulking agent twice a day. Use sugar-free sweetener when possible to avoid excessive calories.  o Eat a normal breakfast.  o Set aside 15 minutes after breakfast to sit  on the toilet, but do not strain to have a bowel movement.  o If you do not have a bowel movement by the third day, use an enema and repeat the above steps.    Controlling diarrhea o Switch to liquids and simpler foods for a few days to avoid stressing your intestines further. o Avoid dairy products (especially milk & ice cream) for a short time.  The intestines often can lose the ability to digest lactose when stressed. o Avoid foods that cause gassiness or bloating.  Typical foods include beans and other legumes, cabbage, broccoli, and dairy foods.  Every person has some sensitivity to  other foods, so listen to our body and avoid those foods that trigger problems for you. o Adding fiber (Citrucel, Metamucil, psyllium, Miralax) gradually can help thicken stools by absorbing excess fluid and retrain the intestines to act more normally.  Slowly increase the dose over a few weeks.  Too much fiber too soon can backfire and cause cramping & bloating. o Probiotics (such as active yogurt, Align, etc) may help repopulate the intestines and colon with normal bacteria and calm down a sensitive digestive tract.  Most studies show it to be of mild help, though, and such products can be costly. o Medicines:   Bismuth subsalicylate (ex. Kayopectate, Pepto Bismol) every 30 minutes for up to 6 doses can help control diarrhea.  Avoid if pregnant.   Loperamide (Immodium) can slow down diarrhea.  Start with two tablets (4mg  total) first and then try one tablet every 6 hours.  Avoid if you are having fevers or severe pain.  If you are not better or start feeling worse, stop all medicines and call your doctor for advice o Call your doctor if you are getting worse or not better.  Sometimes further testing (cultures, endoscopy, X-ray studies, bloodwork, etc) may be needed to help diagnose and treat the cause of the diarrhea.

## 2012-05-29 NOTE — Progress Notes (Signed)
Subjective:     Patient ID: Lisa Osborne, female   DOB: 1940-12-11, 72 y.o.   MRN: [id number]  HPI   Lisa Osborne  05-04-1940 [id number]  Patient Care Team: Eustaquio Boyden, MD as PCP - General (Family Medicine) Ardeth Sportsman, MD as Consulting Physician (General Surgery) Louis Meckel, MD as Consulting Physician (Gastroenterology) Cira Rue, RN as Registered Nurse (Oncology) Ladene Artist, MD as Attending Physician (Medical Oncology)  This patient is a 72 y.o.female who presents today for surgical evaluation   POST-OPERATIVE DIAGNOSIS: cancer at rectosigmoid junction   PROCEDURE: 04/14/2012  LAPAROSCOPIC LOW ANTERIOR RESECTION  Mobilization of Splenic flexure of colon  Rigid proctoscopy  Lysis of adhesions   The patient comes in today feeling worn out.  Walking less.  Appetite not normal but injesting Ensure bottle 1-2 a day.  Nearly done with the 1st round of Xeloda chemotherapy.  Frequent nausea and occasional retching.  " The surgery was far easier than the chemo pills"  Improved with the help of better nausea control..  No fevers or chills.  Urinating fine.  On no blood pressure meds.  Taking iron for anemia.  Some loose BMs but having daily bowel movements.  No more soreness on the incisions.   Patient Active Problem List   Diagnosis Date Noted  . Colon adenocarcinoma - rectosigmoid T3N1 (1/15) s/p lap LAR 04/14/2012     Priority: High  . Diabetes mellitus type 2, controlled     Priority: Medium  . Medicare annual wellness visit, initial 10/12/2011  . Blood in stool 09/29/2011  . IDA (iron deficiency anemia)   . Depression   . GERD (gastroesophageal reflux disease)   . Hypothyroidism   . Urge incontinence     Past Medical History  Diagnosis Date  . History of iron deficiency anemia   . Arthritis     left side lower back pain  . History of chicken pox   . Depression     severe in past  . Diabetes mellitus type 2, controlled 2006    diet  controlled  . GERD (gastroesophageal reflux disease)     bad if off PPI  . Seasonal allergies   . Hypothyroidism     mild  . Urine incontinence     mild, wears pad  . Unilateral deafnesses     left ear deaf, wears hearing aid right side  . Rosacea   . Anemia   . Colon adenocarcinoma - rectosigmoid T3N1 (1/15) s/p lap LAR 04/14/2012 04/14/2012    Status post laparoscopic lower anterior resection    Past Surgical History  Procedure Laterality Date  . Total vaginal hysterectomy  1985    cancer cells on cervix  . Abdominoplasty  1996  . Incontinence surgery  1996    bladder sling  . Colonoscopy  08/2006    normal  . Facelift  05/2011  . Abdominal hysterectomy  1985    total  . Laparoscopic low anterior resection N/A 04/14/2012    colon cancer at rectosigmoid junction s/p rigid proctoscopy splenic flexure mobilization lysis of adhesion;  Surgeon: Ardeth Sportsman, MD;     History   Social History  . Marital Status: Divorced    Spouse Name: N/A    Number of Children: 1  . Years of Education: N/A   Occupational History  . Retired Runner, broadcasting/film/video    Social History Main Topics  . Smoking status: Former Smoker    Quit date: 04/06/1967  .  Smokeless tobacco: Not on file  . Alcohol Use: Yes     Comment: seldom  . Drug Use: No  . Sexually Active: Not on file   Other Topics Concern  . Not on file   Social History Narrative   Caffeine: 1 cup coffee/day   Lives alone.  1 daughter Victorino Dike), 3 stillborns   Occupation: retired, was Air traffic controller.   Activity: no regular activity but does park far away, has treadmill at home   Diet: good fruits/vegetables, more fruit/vegetable smoothies  good amt water    Family History  Problem Relation Age of Onset  . Breast cancer Mother 21    breast cancer then multiple myeloma  . Cancer Mother     breast  . Alcohol abuse Father   . Heart disease Father     heart failure  . Mental illness Brother     2 brothers severe depression, suicide x2    . Diabetes Maternal Aunt   . Diabetes Maternal Uncle   . Diabetes Paternal Uncle   . Diabetes Paternal Grandfather   . Coronary artery disease Neg Hx   . Stroke Neg Hx     Current Outpatient Prescriptions  Medication Sig Dispense Refill  . calcium gluconate 500 MG tablet Take 500 mg by mouth daily.        . capecitabine (XELODA) 500 MG tablet Take 4 tablets (=2000mg ) in AM; then take 4 tablets (=2000mg ) in the PM.  Total of 4000 mg daily for 14 days.  Start on 05/15/12.  112 tablet  0  . ferrous sulfate 325 (65 FE) MG tablet Take 325 mg by mouth daily with breakfast.      . lansoprazole (PREVACID) 15 MG capsule Take 15 mg by mouth daily.       Marland Kitchen levothyroxine (SYNTHROID, LEVOTHROID) 25 MCG tablet Take 25 mcg by mouth daily before breakfast.      . loperamide (IMODIUM A-D) 2 MG tablet Take 1 tablet (2 mg total) by mouth 4 (four) times daily as needed for diarrhea or loose stools.  30 tablet  1  . Multiple Vitamin (MULTIVITAMIN) tablet Take 1 tablet by mouth daily.        . ondansetron (ZOFRAN) 8 MG tablet Take 1 tablet (8 mg total) by mouth every 12 (twelve) hours as needed for nausea (for nausea or vomiting).  20 tablet  1  . prochlorperazine (COMPAZINE) 5 MG tablet Take 1 tablet (5 mg total) by mouth every 6 (six) hours as needed for nausea.  30 tablet  1  . sertraline (ZOLOFT) 50 MG tablet Take 50 mg by mouth daily before breakfast.      . vitamin B-12 (CYANOCOBALAMIN) 100 MCG tablet Take 100 mcg by mouth daily.       . [DISCONTINUED] oxybutynin (DITROPAN) 5 MG tablet Take 1 tablet (5 mg total) by mouth 3 (three) times daily as needed.  60 tablet  0   No current facility-administered medications for this visit.     Allergies  Allergen Reactions  . Latex     Mild reaction  . Nickel     Slight itch  . Shrimp (Shellfish Allergy)     Slight allergy - itching    BP 112/80  Pulse 76  Temp(Src) 98.4 F (36.9 C) (Temporal)  Resp 18  Ht 5\' 9"  (1.753 m)  Wt 212 lb 6.4 oz (96.344  kg)  BMI 31.35 kg/m2  No results found.   Review of Systems  Constitutional: Positive for  activity change, appetite change, fatigue and unexpected weight change. Negative for fever, chills and diaphoresis.  HENT: Negative for ear pain, sore throat and trouble swallowing.   Eyes: Negative for photophobia and visual disturbance.  Respiratory: Negative for cough and choking.   Cardiovascular: Negative for chest pain and palpitations.  Gastrointestinal: Negative for nausea, vomiting, abdominal pain, diarrhea, constipation, blood in stool, abdominal distention, anal bleeding and rectal pain.  Endocrine: Negative for cold intolerance and heat intolerance.  Genitourinary: Negative for dysuria, frequency and difficulty urinating.  Musculoskeletal: Negative for myalgias and gait problem.  Skin: Negative for color change, pallor, rash and wound.  Allergic/Immunologic: Negative for environmental allergies and food allergies.  Neurological: Negative for dizziness, speech difficulty, weakness and numbness.  Hematological: Negative for adenopathy.  Psychiatric/Behavioral: Negative for confusion and agitation. The patient is not nervous/anxious.        Objective:   Physical Exam  Constitutional: She is oriented to person, place, and time. She appears well-developed and well-nourished. She is active.  Non-toxic appearance. She does not have a sickly appearance. She does not appear ill. No distress.  Calm.  Relaxed.  Joking.  HENT:  Head: Normocephalic.  Mouth/Throat: Oropharynx is clear and moist. No oropharyngeal exudate.  Eyes: Conjunctivae and EOM are normal. Pupils are equal, round, and reactive to light. No scleral icterus.  Neck: Normal range of motion. No tracheal deviation present.  Cardiovascular: Normal rate and intact distal pulses.   Pulmonary/Chest: Effort normal. No respiratory distress. She exhibits no tenderness.  Abdominal: Soft. She exhibits no distension. There is no tenderness.  There is no rigidity, no rebound, no guarding, no CVA tenderness, no tenderness at McBurney's point and negative Murphy's sign. No hernia. Hernia confirmed negative in the ventral area, confirmed negative in the right inguinal area and confirmed negative in the left inguinal area.    Incisions clean with normal healing ridges.  No hernias  Genitourinary: No vaginal discharge found.  Musculoskeletal: Normal range of motion. She exhibits no tenderness.  Lymphadenopathy:       Right: No inguinal adenopathy present.       Left: No inguinal adenopathy present.  Neurological: She is alert and oriented to person, place, and time. No cranial nerve deficit. She exhibits normal muscle tone. Coordination normal.  Skin: Skin is warm and dry. No rash noted. She is not diaphoretic.  Psychiatric: She has a normal mood and affect. Her behavior is normal.       Assessment:     Recovering 6 weeks status post laparoscopic assisted low anterior resection for stage III rectal cancer with one lymph node positive, Struggling but nearly completing Xeloda chemotherapy    Plan:     Increase activity as tolerated to regular activity.  Low impact exercise such as walking an hour a day at least ideal.  Do not push through pain.  Diet as tolerated.  Low fat high fiber diet ideal.  Bowel regimen with 30 g fiber a day and fiber supplement as needed to avoid problems.  Chemotherapy regimen per Dr. Truett Perna.  Service/hematoma the incision should resolve on its own.  If not improved or worsening, call us to see veins drainage.  Try to hold off since no evidence of infection and not cause any pain or discomfort  She will require followup colonoscopy in one year.  I will defer to Dr. Arlyce Dice with GI.  Return to clinic as needed.   Instructions discussed.  Followup with primary care physician for other health issues as  would normally be done.  Questions answered.  The patient expressed understanding and appreciation

## 2012-06-01 ENCOUNTER — Other Ambulatory Visit (HOSPITAL_BASED_OUTPATIENT_CLINIC_OR_DEPARTMENT_OTHER): Payer: Medicare PPO | Admitting: Lab

## 2012-06-01 ENCOUNTER — Ambulatory Visit: Payer: Medicare PPO | Admitting: Nutrition

## 2012-06-01 ENCOUNTER — Ambulatory Visit (HOSPITAL_BASED_OUTPATIENT_CLINIC_OR_DEPARTMENT_OTHER): Payer: Medicare PPO | Admitting: Nurse Practitioner

## 2012-06-01 VITALS — BP 100/60 | HR 56 | Temp 96.7°F | Resp 17 | Ht 69.0 in | Wt 208.0 lb

## 2012-06-01 DIAGNOSIS — C189 Malignant neoplasm of colon, unspecified: Secondary | ICD-10-CM

## 2012-06-01 DIAGNOSIS — R197 Diarrhea, unspecified: Secondary | ICD-10-CM

## 2012-06-01 DIAGNOSIS — R112 Nausea with vomiting, unspecified: Secondary | ICD-10-CM

## 2012-06-01 DIAGNOSIS — E119 Type 2 diabetes mellitus without complications: Secondary | ICD-10-CM

## 2012-06-01 DIAGNOSIS — C19 Malignant neoplasm of rectosigmoid junction: Secondary | ICD-10-CM

## 2012-06-01 LAB — CBC WITH DIFFERENTIAL/PLATELET
BASO%: 0.2 % (ref 0.0–2.0)
Eosinophils Absolute: 0 10*3/uL (ref 0.0–0.5)
LYMPH%: 31.1 % (ref 14.0–49.7)
MCHC: 33.6 g/dL (ref 31.5–36.0)
MONO#: 1.5 10*3/uL — ABNORMAL HIGH (ref 0.1–0.9)
NEUT#: 3.7 10*3/uL (ref 1.5–6.5)
Platelets: 291 10*3/uL (ref 145–400)
RBC: 4.54 10*6/uL (ref 3.70–5.45)
WBC: 7.5 10*3/uL (ref 3.9–10.3)
lymph#: 2.3 10*3/uL (ref 0.9–3.3)

## 2012-06-01 MED ORDER — METRONIDAZOLE 500 MG PO TABS: 500.0000 mg | ORAL_TABLET | Freq: Three times a day (TID) | ORAL | Status: DC

## 2012-06-01 NOTE — Progress Notes (Signed)
OFFICE PROGRESS NOTE  Interval history:  Lisa Osborne returns as scheduled. She began cycle 1 adjuvant Xeloda on 05/15/2012. The same day she developed profuse watery diarrhea and intermittent nausea/vomiting that has persisted. She estimates 20-25 loose stools per day. The stools are small volume. She is vomiting at least one time a day and continually feels nauseated. She is tolerating liquids without difficulty and some solids. She denies any mouth sores. No hand or foot pain or redness. She has intermittent crampy abdominal pain. No fever.   Objective: Blood pressure 100/60, pulse 56, temperature 96.7 F (35.9 C), temperature source Axillary, resp. rate 17, height 5\' 9"  (1.753 m), weight 208 lb (94.348 kg).  Oropharynx is without thrush or ulceration. Mucous membranes appear moist. Lungs are clear. Regular cardiac rhythm. Abdomen is soft and nontender. No hepatomegaly. Extremities are without edema. Skin turgor is normal.  Lab Results: Lab Results  Component Value Date   WBC 7.5 06/01/2012   HGB 13.8 06/01/2012   HCT 40.9 06/01/2012   MCV 90.0 06/01/2012   PLT 291 06/01/2012    Chemistry:    Chemistry      Component Value Date/Time   NA 136 04/15/2012 0555   NA 142 04/01/2009   K 4.7 04/15/2012 0555   K 5.7 04/01/2009   CL 100 04/15/2012 0555   CL 105 04/01/2009   CO2 27 04/15/2012 0555   BUN 10 04/15/2012 0555   BUN 21 04/01/2009   CREATININE 0.92 04/15/2012 0555   CREATININE 0.97 04/01/2009      Component Value Date/Time   CALCIUM 8.9 04/15/2012 0555   CALCIUM 9.6 04/01/2009   ALKPHOS 71 03/07/2012 1554   ALKPHOS 82 04/01/2009   AST 24 03/07/2012 1554   AST 22 04/01/2009   ALT 12 03/07/2012 1554   BILITOT 0.5 03/07/2012 1554   BILITOT 0.7 04/01/2009       Studies/Results: No results found.  Medications: I have reviewed the patient's current medications.  Assessment/Plan:  1. Moderately differentiated adenocarcinoma of the rectosigmoid colon, microsatellite stable, stage IIIB (T4 N1) status  post low anterior resection on 04/14/2012. She began cycle 1 adjuvant Xeloda 05/15/2012. 2. Elevated preoperative CEA. Repeat level today pending. 3. Diabetes. 4. Diarrhea. 5. Nausea with intermittent vomiting.  Disposition-[name] has completed one cycle of adjuvant Xeloda. She began having profuse watery diarrhea day 1. She is also experiencing nausea. She does not have mouth sores or symptoms of hand-foot syndrome. We feel it is unlikely the diarrhea and nausea are related to Xeloda. She was able to submit a stool sample while in the office today for C. difficile testing. She will begin empiric Flagyl. We discussed hospitalization. At present she is tolerating liquids without difficulty and does not feel she needs to be hospitalized. She will continue to push fluids. We are placing Xeloda on hold pending a followup visit on 06/12/2012. She was instructed to contact the office prior to that visit with continued diarrhea.  Patient seen with Dr. Truett Perna.  Lonna Cobb ANP/GNP-BC

## 2012-06-01 NOTE — Progress Notes (Signed)
Patient is a 72 year old female diagnosed with colon cancer status post anterior resection. She is a patient of Dr. Kalman Drape.  Past medical history includes iron deficiency anemia, depression, diabetes, GERD, and hypothyroidism.  Medications include Xeloda, ferrous sulfate, Synthroid, Imodium, multivitamin, Zofran, Compazine, Zoloft, and vitamin B12. Labs include: Glucose 144 on March 22.  Height: 69 inches. Weight: 212.4 pounds. Usual body weight: 235 pounds September 2013. BMI: 31.35.  Patient initially did not want to stay for nutrition consult nor did she want to stay for her appointment with nurse practitioner. She states she is too tired. Patient complains of nausea and vomiting and diarrhea for 3 weeks. She complains of taste alterations. She states she is up all night going to the bathroom. She can drink Ensure without difficulty however, she must drink it slowly. Daughter verbalizing that she wishes mother would eat healthier.  Nutrition diagnosis: Food and nutrition related knowledge deficit related to diagnosis of colon cancer and nutrition impact symptoms as evidenced by no prior need for nutrition related information.  Intervention: I educated patient and daughter on strategies for increasing oral intake utilizing clear liquids and bland foods frequently throughout the day. I stressed the importance of her taking nausea medications as prescribed. I've encouraged her to discuss nausea and vomiting and diarrhea with her physician. I have provided oral nutrition supplement samples as well as fact sheets for patient to take with her today. She has my contact information for questions or concerns. Teach back method used.  Monitoring, evaluation, goals: Patient will tolerate diet advancement with improved nutrition impact symptoms.  Next visit: Patient will contact me for questions or concerns or for followup as needed.

## 2012-06-02 ENCOUNTER — Telehealth: Payer: Self-pay | Admitting: Oncology

## 2012-06-02 LAB — CLOSTRIDIUM DIFFICILE BY PCR: Toxigenic C. Difficile by PCR: NEGATIVE

## 2012-06-02 LAB — CEA: CEA: 0.8 ng/mL (ref 0.0–5.0)

## 2012-06-06 ENCOUNTER — Telehealth: Payer: Self-pay | Admitting: *Deleted

## 2012-06-06 NOTE — Telephone Encounter (Signed)
Left VM requesting patient to call office with update on her status regarding her intake and diarrhea.

## 2012-06-12 ENCOUNTER — Telehealth: Payer: Self-pay | Admitting: Nurse Practitioner

## 2012-06-12 ENCOUNTER — Telehealth: Payer: Self-pay | Admitting: Oncology

## 2012-06-12 ENCOUNTER — Other Ambulatory Visit: Payer: Self-pay | Admitting: Nurse Practitioner

## 2012-06-12 ENCOUNTER — Other Ambulatory Visit (HOSPITAL_BASED_OUTPATIENT_CLINIC_OR_DEPARTMENT_OTHER): Payer: Medicare PPO | Admitting: Lab

## 2012-06-12 ENCOUNTER — Ambulatory Visit (HOSPITAL_BASED_OUTPATIENT_CLINIC_OR_DEPARTMENT_OTHER): Payer: Medicare PPO | Admitting: Nurse Practitioner

## 2012-06-12 VITALS — BP 125/70 | HR 71 | Temp 97.1°F | Resp 20 | Ht 69.0 in | Wt 208.3 lb

## 2012-06-12 DIAGNOSIS — C189 Malignant neoplasm of colon, unspecified: Secondary | ICD-10-CM

## 2012-06-12 DIAGNOSIS — E876 Hypokalemia: Secondary | ICD-10-CM

## 2012-06-12 DIAGNOSIS — E119 Type 2 diabetes mellitus without complications: Secondary | ICD-10-CM

## 2012-06-12 DIAGNOSIS — C19 Malignant neoplasm of rectosigmoid junction: Secondary | ICD-10-CM

## 2012-06-12 DIAGNOSIS — R197 Diarrhea, unspecified: Secondary | ICD-10-CM

## 2012-06-12 LAB — COMPREHENSIVE METABOLIC PANEL (CC13)
Albumin: 2.6 g/dL — ABNORMAL LOW (ref 3.5–5.0)
Alkaline Phosphatase: 65 U/L (ref 40–150)
BUN: 18.1 mg/dL (ref 7.0–26.0)
Calcium: 8.3 mg/dL — ABNORMAL LOW (ref 8.4–10.4)
Chloride: 103 mEq/L (ref 98–107)
Glucose: 137 mg/dl — ABNORMAL HIGH (ref 70–99)
Potassium: 3.2 mEq/L — ABNORMAL LOW (ref 3.5–5.1)
Sodium: 141 mEq/L (ref 136–145)
Total Protein: 6.2 g/dL — ABNORMAL LOW (ref 6.4–8.3)

## 2012-06-12 LAB — CBC WITH DIFFERENTIAL/PLATELET
Basophils Absolute: 0 10*3/uL (ref 0.0–0.1)
Eosinophils Absolute: 0 10*3/uL (ref 0.0–0.5)
HGB: 11.7 g/dL (ref 11.6–15.9)
MONO#: 0.5 10*3/uL (ref 0.1–0.9)
MONO%: 8.3 % (ref 0.0–14.0)
NEUT#: 4 10*3/uL (ref 1.5–6.5)
RBC: 3.97 10*6/uL (ref 3.70–5.45)
RDW: 15.6 % — ABNORMAL HIGH (ref 11.2–14.5)
WBC: 6.3 10*3/uL (ref 3.9–10.3)
lymph#: 1.7 10*3/uL (ref 0.9–3.3)

## 2012-06-12 MED ORDER — CAPECITABINE 500 MG PO TABS: 1500.0000 mg | ORAL_TABLET | Freq: Two times a day (BID) | ORAL | Status: DC

## 2012-06-12 MED ORDER — POTASSIUM CHLORIDE CRYS ER 20 MEQ PO TBCR: 20.0000 meq | EXTENDED_RELEASE_TABLET | Freq: Every day | ORAL | Status: DC

## 2012-06-12 NOTE — Telephone Encounter (Signed)
Gave pt appt for june 2014 lab and MD °

## 2012-06-12 NOTE — Progress Notes (Signed)
OFFICE PROGRESS NOTE  Interval history:  Lisa Osborne returns as scheduled. She began cycle 1 adjuvant Xeloda on 05/15/2012. The same day she developed profuse watery diarrhea and intermittent nausea/vomiting that has persisted. At an office visit on 06/01/2012 she reported 20-25 loose stools per day. Stool was negative for C. difficile. She was prescribed Flagyl. She was unable to tolerate the Flagyl due to nausea/vomiting and discontinued it after one day.  The diarrhea improved spontaneously 3-4 days after her last office visit. She now mainly notes loose stools after eating, estimating 3-4 bowel movements per day. Stools are black. She takes oral iron. She overall is feeling better. Weakness has improved considerably. Her appetite is returning. She denies mouth sores. No nausea/vomiting. No hand or foot pain or redness. Over the past several days she has noted lower abdominal pain with bowel movements.   Objective: Blood pressure 125/70, pulse 71, temperature 97.1 F (36.2 C), temperature source Oral, resp. rate 20, height 5\' 9"  (1.753 m), weight 208 lb 4.8 oz (94.484 kg).  No thrush or ulcerations. Lungs are clear. Regular cardiac rhythm. Abdomen is soft; mildly tender over the right upper quadrant. No organomegaly. Extremities are without edema.  Lab Results: Lab Results  Component Value Date   WBC 6.3 06/12/2012   HGB 11.7 06/12/2012   HCT 35.6 06/12/2012   MCV 89.8 06/12/2012   PLT 245 06/12/2012    Chemistry:    Chemistry      Component Value Date/Time   NA 141 06/12/2012 1345   NA 136 04/15/2012 0555   NA 142 04/01/2009   K 3.2* 06/12/2012 1345   K 4.7 04/15/2012 0555   K 5.7 04/01/2009   CL 103 06/12/2012 1345   CL 100 04/15/2012 0555   CL 105 04/01/2009   CO2 30* 06/12/2012 1345   CO2 27 04/15/2012 0555   BUN 18.1 06/12/2012 1345   BUN 10 04/15/2012 0555   BUN 21 04/01/2009   CREATININE 1.0 06/12/2012 1345   CREATININE 0.92 04/15/2012 0555   CREATININE 0.97 04/01/2009      Component  Value Date/Time   CALCIUM 8.3* 06/12/2012 1345   CALCIUM 8.9 04/15/2012 0555   CALCIUM 9.6 04/01/2009   ALKPHOS 65 06/12/2012 1345   ALKPHOS 71 03/07/2012 1554   ALKPHOS 82 04/01/2009   AST 15 06/12/2012 1345   AST 24 03/07/2012 1554   AST 22 04/01/2009   ALT 10 06/12/2012 1345   ALT 12 03/07/2012 1554   BILITOT 0.29 06/12/2012 1345   BILITOT 0.5 03/07/2012 1554   BILITOT 0.7 04/01/2009       Studies/Results: No results found.  Medications: I have reviewed the patient's current medications.  Assessment/Plan:  1. Moderately differentiated adenocarcinoma of the rectosigmoid colon, microsatellite stable, stage IIIB (T4 N1) status post low anterior resection on 04/14/2012. She completed cycle 1 adjuvant Xeloda 05/15/2012. Cycle 2 held due to to diarrhea. 2. Elevated preoperative CEA. Repeat level 06/01/2012 normal at 0.8. 3. Diabetes. 4. Diarrhea coinciding with initiation of cycle 1 adjuvant Xeloda. C. difficile negative. Improved. 5. Nausea with intermittent vomiting. Resolved.  Disposition-Ms. Mccammon appears improved. The diarrhea is significantly better. Dr. Truett Perna feels it is unlikely the profuse diarrhea she was experiencing at the time of her last visit was related to Xeloda and recommends proceeding with cycle 2 with a dose reduction. She does not have Xeloda on hand. We anticipate she will begin cycle 2 06/15/2012. She will return for a followup visit on 06/29/2012. She will contact the  office in the interim with any problems. She understands to discontinue Xeloda and contact the office with increased diarrhea. We instructed her to discontinue oral iron as this may be contributing to the diarrhea.  Plan reviewed with Dr. Truett Perna.  Lonna Cobb ANP/GNP-BC

## 2012-06-12 NOTE — Telephone Encounter (Signed)
I notified Lisa Osborne of mild hypokalemia likely due to diarrhea. Prescription sent to her pharmacy for K-Dur 20 milliequivalents daily. We will obtain repeat labs at the time of her next visit.

## 2012-06-13 ENCOUNTER — Telehealth: Payer: Self-pay | Admitting: Oncology

## 2012-06-13 NOTE — Telephone Encounter (Signed)
no vm could be left (full)...mailed pt appt sched and letter

## 2012-06-27 ENCOUNTER — Telehealth: Payer: Self-pay | Admitting: Oncology

## 2012-06-27 NOTE — Telephone Encounter (Signed)
Talked to pt, he r/s appt from 06/29/12 to 07/06/12 lasb and ML , nurse notified

## 2012-06-29 ENCOUNTER — Ambulatory Visit: Payer: Medicare PPO | Admitting: Nurse Practitioner

## 2012-06-29 ENCOUNTER — Other Ambulatory Visit: Payer: Medicare PPO | Admitting: Lab

## 2012-07-02 ENCOUNTER — Other Ambulatory Visit: Payer: Self-pay | Admitting: Oncology

## 2012-07-06 ENCOUNTER — Other Ambulatory Visit (HOSPITAL_BASED_OUTPATIENT_CLINIC_OR_DEPARTMENT_OTHER): Payer: Medicare PPO | Admitting: Lab

## 2012-07-06 ENCOUNTER — Ambulatory Visit (HOSPITAL_BASED_OUTPATIENT_CLINIC_OR_DEPARTMENT_OTHER): Payer: Medicare PPO | Admitting: Nurse Practitioner

## 2012-07-06 VITALS — BP 133/80 | HR 75 | Temp 99.8°F | Resp 20 | Ht 69.0 in | Wt 211.5 lb

## 2012-07-06 DIAGNOSIS — E119 Type 2 diabetes mellitus without complications: Secondary | ICD-10-CM

## 2012-07-06 DIAGNOSIS — C189 Malignant neoplasm of colon, unspecified: Secondary | ICD-10-CM

## 2012-07-06 DIAGNOSIS — C19 Malignant neoplasm of rectosigmoid junction: Secondary | ICD-10-CM

## 2012-07-06 DIAGNOSIS — E876 Hypokalemia: Secondary | ICD-10-CM

## 2012-07-06 LAB — CBC WITH DIFFERENTIAL/PLATELET
BASO%: 0.5 % (ref 0.0–2.0)
Basophils Absolute: 0 10e3/uL (ref 0.0–0.1)
EOS%: 0.3 % (ref 0.0–7.0)
Eosinophils Absolute: 0 10e3/uL (ref 0.0–0.5)
HCT: 35.7 % (ref 34.8–46.6)
HGB: 12.1 g/dL (ref 11.6–15.9)
LYMPH%: 41.5 % (ref 14.0–49.7)
MCH: 31.3 pg (ref 25.1–34.0)
MCHC: 33.9 g/dL (ref 31.5–36.0)
MCV: 92.3 fL (ref 79.5–101.0)
MONO#: 0.6 10e3/uL (ref 0.1–0.9)
MONO%: 9.5 % (ref 0.0–14.0)
NEUT#: 2.9 10e3/uL (ref 1.5–6.5)
NEUT%: 48.2 % (ref 38.4–76.8)
Platelets: 220 10e3/uL (ref 145–400)
RBC: 3.87 10e6/uL (ref 3.70–5.45)
RDW: 18.8 % — ABNORMAL HIGH (ref 11.2–14.5)
WBC: 6 10e3/uL (ref 3.9–10.3)
lymph#: 2.5 10e3/uL (ref 0.9–3.3)

## 2012-07-06 LAB — COMPREHENSIVE METABOLIC PANEL (CC13)
ALT: 9 U/L (ref 0–55)
AST: 16 U/L (ref 5–34)
Albumin: 3.4 g/dL — ABNORMAL LOW (ref 3.5–5.0)
Alkaline Phosphatase: 88 U/L (ref 40–150)
BUN: 17.2 mg/dL (ref 7.0–26.0)
CO2: 26 meq/L (ref 22–29)
Calcium: 9 mg/dL (ref 8.4–10.4)
Chloride: 107 meq/L (ref 98–107)
Creatinine: 1 mg/dL (ref 0.6–1.1)
Glucose: 126 mg/dL — ABNORMAL HIGH (ref 70–99)
Potassium: 3.8 meq/L (ref 3.5–5.1)
Sodium: 141 meq/L (ref 136–145)
Total Bilirubin: 0.35 mg/dL (ref 0.20–1.20)
Total Protein: 7.1 g/dL (ref 6.4–8.3)

## 2012-07-06 NOTE — Progress Notes (Signed)
Confirmed with [facility] [phone] that patient still has #3 refills on file for her capecitabine. They will prepare refill and contact her for delivery.

## 2012-07-06 NOTE — Progress Notes (Signed)
OFFICE PROGRESS NOTE  Interval history:  Ms. Stair returns as scheduled. She completed cycle 2 Xeloda beginning 06/21/2012. She denies nausea/vomiting. No diarrhea. She notes "cold sores" at the corners of the mouth bilaterally. No ulcers within the oral cavity. No hand or foot pain or redness. She has a good appetite and overall is feeling well.   Objective: Blood pressure 133/80, pulse 75, temperature 99.8 F (37.7 C), temperature source Oral, resp. rate 20, height 5\' 9"  (1.753 m), weight 211 lb 8 oz (95.936 kg).  Mild angular cheilitis at the corners of the mouth bilaterally. No thrush or ulcerations within the oral cavity. Lungs are clear. Regular cardiac rhythm. Abdomen soft and nontender. No hepatomegaly. Extremities are without edema. Calves soft and nontender. Palms are without erythema. No skin breakdown.  Lab Results: Lab Results  Component Value Date   WBC 6.0 07/06/2012   HGB 12.1 07/06/2012   HCT 35.7 07/06/2012   MCV 92.3 07/06/2012   PLT 220 07/06/2012    Chemistry:    Chemistry      Component Value Date/Time   NA 141 07/06/2012 1428   NA 136 04/15/2012 0555   NA 142 04/01/2009   K 3.8 07/06/2012 1428   K 4.7 04/15/2012 0555   K 5.7 04/01/2009   CL 107 07/06/2012 1428   CL 100 04/15/2012 0555   CL 105 04/01/2009   CO2 26 07/06/2012 1428   CO2 27 04/15/2012 0555   BUN 17.2 07/06/2012 1428   BUN 10 04/15/2012 0555   BUN 21 04/01/2009   CREATININE 1.0 07/06/2012 1428   CREATININE 0.92 04/15/2012 0555   CREATININE 0.97 04/01/2009      Component Value Date/Time   CALCIUM 9.0 07/06/2012 1428   CALCIUM 8.9 04/15/2012 0555   CALCIUM 9.6 04/01/2009   ALKPHOS 88 07/06/2012 1428   ALKPHOS 71 03/07/2012 1554   ALKPHOS 82 04/01/2009   AST 16 07/06/2012 1428   AST 24 03/07/2012 1554   AST 22 04/01/2009   ALT 9 07/06/2012 1428   ALT 12 03/07/2012 1554   BILITOT 0.35 07/06/2012 1428   BILITOT 0.5 03/07/2012 1554   BILITOT 0.7 04/01/2009       Studies/Results: No results found.  Medications: I  have reviewed the patient's current medications.  Assessment/Plan:  1. Moderately differentiated adenocarcinoma of the rectosigmoid colon, microsatellite stable, stage IIIB (T4 N1) status post low anterior resection on 04/14/2012. She completed cycle 1 adjuvant Xeloda 05/15/2012. Cycle 2 held due to to diarrhea. She completed cycle 2 at a reduced dose (1500 mg twice daily for 14 days) beginning 06/21/2012. 2. Elevated preoperative CEA. Repeat level 06/01/2012 normal at 0.8. 3. Diabetes. 4. Diarrhea coinciding with initiation of cycle 1 adjuvant Xeloda. C. difficile negative. Resolved. She did not have diarrhea with cycle 2 Xeloda. 5. Nausea with intermittent vomiting. Resolved. 6. Hypokalemia on labs 06/12/2012. Improved. She will continue Kdur 20 mEq daily.  Disposition-Ms. Mcquaid appears stable. She has completed 2 cycles of adjuvant Xeloda. Plan to proceed with cycle 3 beginning 07/12/2012. She will return for a followup visit on 07/24/2012. She will contact the office in the interim with any problems. We specifically discussed recurrent diarrhea.  Lonna Cobb ANP/GNP-BC

## 2012-07-07 ENCOUNTER — Telehealth: Payer: Self-pay | Admitting: Medical Oncology

## 2012-07-07 NOTE — Telephone Encounter (Signed)
Pt stated she knows how to contact [facility] for her refills on xeloda. " I already did that , I called them today".

## 2012-07-24 ENCOUNTER — Other Ambulatory Visit (HOSPITAL_BASED_OUTPATIENT_CLINIC_OR_DEPARTMENT_OTHER): Payer: Medicare PPO | Admitting: Lab

## 2012-07-24 ENCOUNTER — Ambulatory Visit (HOSPITAL_BASED_OUTPATIENT_CLINIC_OR_DEPARTMENT_OTHER): Payer: Medicare PPO | Admitting: Oncology

## 2012-07-24 ENCOUNTER — Telehealth: Payer: Self-pay | Admitting: Oncology

## 2012-07-24 VITALS — BP 145/79 | HR 71 | Temp 99.0°F | Resp 20 | Ht 69.0 in | Wt 215.7 lb

## 2012-07-24 DIAGNOSIS — C189 Malignant neoplasm of colon, unspecified: Secondary | ICD-10-CM

## 2012-07-24 LAB — CBC WITH DIFFERENTIAL/PLATELET
BASO%: 0.4 % (ref 0.0–2.0)
EOS%: 0.4 % (ref 0.0–7.0)
MCH: 32.3 pg (ref 25.1–34.0)
MCHC: 34.3 g/dL (ref 31.5–36.0)
MCV: 94.1 fL (ref 79.5–101.0)
MONO%: 7.6 % (ref 0.0–14.0)
RBC: 3.54 10*6/uL — ABNORMAL LOW (ref 3.70–5.45)
RDW: 21.9 % — ABNORMAL HIGH (ref 11.2–14.5)

## 2012-07-24 LAB — COMPREHENSIVE METABOLIC PANEL (CC13)
AST: 16 U/L (ref 5–34)
Albumin: 3.6 g/dL (ref 3.5–5.0)
Alkaline Phosphatase: 78 U/L (ref 40–150)
BUN: 15.4 mg/dL (ref 7.0–26.0)
Potassium: 4 mEq/L (ref 3.5–5.1)
Sodium: 140 mEq/L (ref 136–145)
Total Protein: 7.3 g/dL (ref 6.4–8.3)

## 2012-07-24 NOTE — Telephone Encounter (Signed)
gv and printeda pp tshced and avs for pt.

## 2012-07-24 NOTE — Progress Notes (Signed)
   [facility]    OFFICE PROGRESS NOTE   INTERVAL HISTORY:  She returns as scheduled. She began another cycle of Xeloda on 07/12/2012. No mouth sores, diarrhea, or hand/foot pain. She feels well.  Objective:  Vital signs in last 24 hours:  Blood pressure 145/79, pulse 71, temperature 99 F (37.2 C), temperature source Oral, resp. rate 20, height 5\' 9"  (1.753 m), weight 215 lb 11.2 oz (97.841 kg).    HEENT: No thrush or ulcers Resp: Lungs clear bilaterally Cardio: Regular rate and rhythm GI: No hepatomegaly Vascular: No leg edema  Skin: Palms without erythema. Mild erythema and a few calluses over the soles. No skin breakdown.     Lab Results:  Lab Results  Component Value Date   WBC 6.4 07/24/2012   HGB 11.4* 07/24/2012   HCT 33.2* 07/24/2012   MCV 94.1 07/24/2012   PLT 221 07/24/2012   ANC 3.3    Medications: I have reviewed the patient's current medications.  Assessment/Plan: 1. Moderately differentiated adenocarcinoma of the rectosigmoid colon, microsatellite stable, stage IIIB (T4 N1) status post low anterior resection on 04/14/2012. She completed cycle 1 adjuvant Xeloda 05/15/2012. Cycle 2 held due to to diarrhea. She completed cycle 2 at a reduced dose (1500 mg twice daily for 14 days) beginning 06/21/2012. She began cycle 3 on 07/12/2012. 2. Elevated preoperative CEA. Repeat level 06/01/2012 normal at 0.8. 3. Diabetes. 4. Diarrhea coinciding with initiation of cycle 1 adjuvant Xeloda. C. difficile negative. Resolved. She did not have diarrhea with cycle 2 Xeloda. 5. Nausea with intermittent vomiting. Resolved. 6. Hypokalemia on labs 06/12/2012. Resolved.   Disposition:  She has completed 3 cycles of adjuvant Xeloda. She has tolerated the Xeloda well with the dose reduction. The plan is to proceed with cycle 4 on 08/02/2012. She will return for an office visit on 08/21/2012. We will dose escalate the Xeloda to 4 tablets in the morning and 3 tablets  in the evening beginning with cycle 5 if she remains without toxicity. She will discontinue potassium today.   Thornton Papas, MD  07/24/2012  3:36 PM

## 2012-07-24 NOTE — Addendum Note (Signed)
Addended by: Caleb Popp on: 07/24/2012 04:23 PM   Modules accepted: Orders

## 2012-08-21 ENCOUNTER — Ambulatory Visit (HOSPITAL_BASED_OUTPATIENT_CLINIC_OR_DEPARTMENT_OTHER): Payer: Medicare PPO | Admitting: Nurse Practitioner

## 2012-08-21 ENCOUNTER — Other Ambulatory Visit (HOSPITAL_BASED_OUTPATIENT_CLINIC_OR_DEPARTMENT_OTHER): Payer: Medicare PPO | Admitting: Lab

## 2012-08-21 ENCOUNTER — Other Ambulatory Visit: Payer: Self-pay | Admitting: *Deleted

## 2012-08-21 ENCOUNTER — Telehealth: Payer: Self-pay | Admitting: Oncology

## 2012-08-21 VITALS — BP 143/77 | HR 75 | Temp 97.8°F | Resp 18 | Ht 69.0 in | Wt 217.6 lb

## 2012-08-21 DIAGNOSIS — C189 Malignant neoplasm of colon, unspecified: Secondary | ICD-10-CM

## 2012-08-21 DIAGNOSIS — C19 Malignant neoplasm of rectosigmoid junction: Secondary | ICD-10-CM

## 2012-08-21 LAB — CBC WITH DIFFERENTIAL/PLATELET
Basophils Absolute: 0 10*3/uL (ref 0.0–0.1)
EOS%: 0.4 % (ref 0.0–7.0)
HGB: 11.7 g/dL (ref 11.6–15.9)
MCH: 34.1 pg — ABNORMAL HIGH (ref 25.1–34.0)
MCV: 98.5 fL (ref 79.5–101.0)
MONO%: 9.1 % (ref 0.0–14.0)
NEUT%: 46.4 % (ref 38.4–76.8)
RDW: 24.1 % — ABNORMAL HIGH (ref 11.2–14.5)

## 2012-08-21 LAB — BASIC METABOLIC PANEL (CC13)
BUN: 15.7 mg/dL (ref 7.0–26.0)
Calcium: 9.1 mg/dL (ref 8.4–10.4)
Creatinine: 1 mg/dL (ref 0.6–1.1)
Potassium: 4.2 mEq/L (ref 3.5–5.1)

## 2012-08-21 MED ORDER — CAPECITABINE 500 MG PO TABS: ORAL_TABLET | ORAL | Status: DC

## 2012-08-21 NOTE — Telephone Encounter (Signed)
Gave pt appt for lab, ML and MD for August 2014 and September 2014

## 2012-08-21 NOTE — Progress Notes (Signed)
OFFICE PROGRESS NOTE  Interval history:  Lisa Osborne returns as scheduled. She completed cycle 4 adjuvant Xeloda beginning 08/02/2012. She denies nausea/vomiting. No mouth sores. She has periodic loose stools. No hand or foot pain or redness. She notes a patch of dry skin on the left thumb.   Objective: Blood pressure 143/77, pulse 75, temperature 97.8 F (36.6 C), temperature source Oral, resp. rate 18, height 5\' 9"  (1.753 m), weight 217 lb 9.6 oz (98.703 kg).  Oropharynx is without thrush or ulceration. Lungs are clear. Regular cardiac rhythm. Abdomen is soft. No hepatomegaly. Extremities are without edema. Palms and soles without erythema. Left thumb with small area of dry skin.  Lab Results: Lab Results  Component Value Date   WBC 6.9 08/21/2012   HGB 11.7 08/21/2012   HCT 33.7* 08/21/2012   MCV 98.5 08/21/2012   PLT 217 08/21/2012    Chemistry:    Chemistry      Component Value Date/Time   NA 142 08/21/2012 1421   NA 136 04/15/2012 0555   NA 142 04/01/2009   K 4.2 08/21/2012 1421   K 4.7 04/15/2012 0555   K 5.7 04/01/2009   CL 107 07/06/2012 1428   CL 100 04/15/2012 0555   CL 105 04/01/2009   CO2 24 08/21/2012 1421   CO2 27 04/15/2012 0555   BUN 15.7 08/21/2012 1421   BUN 10 04/15/2012 0555   BUN 21 04/01/2009   CREATININE 1.0 08/21/2012 1421   CREATININE 0.92 04/15/2012 0555   CREATININE 0.97 04/01/2009      Component Value Date/Time   CALCIUM 9.1 08/21/2012 1421   CALCIUM 8.9 04/15/2012 0555   CALCIUM 9.6 04/01/2009   ALKPHOS 78 07/24/2012 1437   ALKPHOS 71 03/07/2012 1554   ALKPHOS 82 04/01/2009   AST 16 07/24/2012 1437   AST 24 03/07/2012 1554   AST 22 04/01/2009   ALT 6 07/24/2012 1437   ALT 12 03/07/2012 1554   BILITOT 0.56 07/24/2012 1437   BILITOT 0.5 03/07/2012 1554   BILITOT 0.7 04/01/2009       Studies/Results: No results found.  Medications: I have reviewed the patient's current medications.  Assessment/Plan:  1. Moderately differentiated adenocarcinoma of the rectosigmoid  colon, microsatellite stable, stage IIIB (T4 N1) status post low anterior resection on 04/14/2012. She completed cycle 1 adjuvant Xeloda 05/15/2012. Cycle 2 held due to to diarrhea. She completed cycle 2 at a reduced dose (1500 mg twice daily for 14 days) beginning 06/21/2012. She began cycle 3 on 07/12/2012. 2. Elevated preoperative CEA. Repeat level 06/01/2012 normal at 0.8. 3. Diabetes. 4. Diarrhea coinciding with initiation of cycle 1 adjuvant Xeloda. C. difficile negative. Resolved. She did not have diarrhea with cycle 2 Xeloda. She has intermittent loose stools. 5. Nausea with intermittent vomiting. Resolved. 6. Hypokalemia on labs 06/12/2012. Resolved.  Disposition-Ms. Snook appears stable. She has completed 4 cycles of adjuvant Xeloda. Plan to proceed with cycle 5 on 08/23/2012 with dose escalation to 4 tablets in the morning and 3 tablets in the evening for 14 days followed by a 7 day break as outlined in Dr. Kalman Drape note dated 07/24/2012. She understands to discontinue the Xeloda and contact the office with hand/ foot symptoms, diarrhea or mouth sores.  She will return for a followup visit in 3 weeks.  Lisa Osborne ANP/GNP-BC

## 2012-09-11 ENCOUNTER — Telehealth: Payer: Self-pay

## 2012-09-11 ENCOUNTER — Other Ambulatory Visit (HOSPITAL_BASED_OUTPATIENT_CLINIC_OR_DEPARTMENT_OTHER): Payer: Medicare PPO | Admitting: Lab

## 2012-09-11 ENCOUNTER — Ambulatory Visit (HOSPITAL_BASED_OUTPATIENT_CLINIC_OR_DEPARTMENT_OTHER): Payer: Medicare PPO | Admitting: Oncology

## 2012-09-11 VITALS — BP 134/75 | HR 76 | Temp 98.0°F | Resp 20 | Ht 69.0 in | Wt 220.6 lb

## 2012-09-11 DIAGNOSIS — L270 Generalized skin eruption due to drugs and medicaments taken internally: Secondary | ICD-10-CM

## 2012-09-11 DIAGNOSIS — C19 Malignant neoplasm of rectosigmoid junction: Secondary | ICD-10-CM

## 2012-09-11 DIAGNOSIS — E119 Type 2 diabetes mellitus without complications: Secondary | ICD-10-CM

## 2012-09-11 DIAGNOSIS — C189 Malignant neoplasm of colon, unspecified: Secondary | ICD-10-CM

## 2012-09-11 DIAGNOSIS — R5381 Other malaise: Secondary | ICD-10-CM

## 2012-09-11 LAB — COMPREHENSIVE METABOLIC PANEL (CC13)
ALT: 10 U/L (ref 0–55)
Albumin: 3.7 g/dL (ref 3.5–5.0)
CO2: 22 mEq/L (ref 22–29)
Glucose: 110 mg/dl (ref 70–140)
Potassium: 4.1 mEq/L (ref 3.5–5.1)
Sodium: 142 mEq/L (ref 136–145)
Total Protein: 7.3 g/dL (ref 6.4–8.3)

## 2012-09-11 LAB — CBC WITH DIFFERENTIAL/PLATELET
Eosinophils Absolute: 0.1 10*3/uL (ref 0.0–0.5)
MONO#: 0.6 10*3/uL (ref 0.1–0.9)
NEUT#: 4.1 10*3/uL (ref 1.5–6.5)
RBC: 3.24 10*6/uL — ABNORMAL LOW (ref 3.70–5.45)
RDW: 24.9 % — ABNORMAL HIGH (ref 11.2–14.5)
WBC: 7.8 10*3/uL (ref 3.9–10.3)
lymph#: 3.1 10*3/uL (ref 0.9–3.3)

## 2012-09-11 NOTE — Telephone Encounter (Signed)
gv and printed appt sched and avs for pt  °

## 2012-09-11 NOTE — Progress Notes (Signed)
   [facility]    OFFICE PROGRESS NOTE   INTERVAL HISTORY:   She returns as scheduled. She began another cycle of Xeloda on 08/30/2012. She has developed erythema and discomfort over the palms and soles. Angular chelitis, no mouth sores. Occasional diarrhea. She otherwise feels well.  Objective:  Vital signs in last 24 hours:  Blood pressure 134/75, pulse 76, temperature 98 F (36.7 C), temperature source Oral, resp. rate 20, height 5\' 9"  (1.753 m), weight 220 lb 9.6 oz (100.064 kg).    HEENT: No thrush or ulcers, mild angular chelitis Resp: Lungs clear bilaterally Cardio: Regular rate and rhythm GI: No hepatomegaly, no mass Vascular: No leg edema  Skin: Erythema with callus formation of the palms and soles., Mild erythematous rash at the upper chest and back     Lab Results:  Lab Results  Component Value Date   WBC 7.8 09/11/2012   HGB 11.4* 09/11/2012   HCT 33.0* 09/11/2012   MCV 101.7* 09/11/2012   PLT 224 09/11/2012   ANC 4.1    Medications: I have reviewed the patient's current medications.  Assessment/Plan: 1. Moderately differentiated adenocarcinoma of the rectosigmoid colon, microsatellite stable, stage IIIB (T4 N1) status post low anterior resection on 04/14/2012. She completed cycle 1 adjuvant Xeloda 05/15/2012. Cycle 2 held due to to diarrhea. She completed cycle 2 at a reduced dose (1500 mg twice daily for 14 days) beginning 06/21/2012. She began cycle 3 on 07/12/2012, cycle 4 08/02/2012, and cycle 5 08/30/2012 2. Elevated preoperative CEA. Repeat level 06/01/2012 normal at 0.8. 3. Diabetes. 4. Diarrhea coinciding with initiation of cycle 1 adjuvant Xeloda. C. difficile negative. Resolved. She did not have diarrhea with cycle 2 Xeloda. She has intermittent loose stools. 5. Nausea with intermittent vomiting. Resolved. 6. Hypokalemia on labs 06/12/2012. Resolved. 7. Hand/foot syndrome secondary to Frazier Butt will hold chemotherapy today and again  on 09/12/2012.   Disposition:  She has developed significant hand/foot syndrome with cycle 5 of Xeloda. The Xeloda was dose escalated to 2000 mg in the morning and 1500 mg in the evening with cycle 5. She will hold the last 1-1/2 days of Xeloda with this cycle. She will return for an office visit on 09/20/2012. The Xeloda dose will be decreased with the next cycle of chemotherapy.   Thornton Papas, MD  09/11/2012  4:57 PM

## 2012-09-12 ENCOUNTER — Other Ambulatory Visit: Payer: Self-pay | Admitting: *Deleted

## 2012-09-12 NOTE — Telephone Encounter (Signed)
THIS REFILL REQUEST FOR CAPECITABINE WAS GIVEN TO DR.SHERRILL'S NURSE, TANYA WHITLOCK,RN. 

## 2012-09-20 ENCOUNTER — Ambulatory Visit (HOSPITAL_BASED_OUTPATIENT_CLINIC_OR_DEPARTMENT_OTHER): Payer: Medicare PPO | Admitting: Oncology

## 2012-09-20 ENCOUNTER — Other Ambulatory Visit: Payer: Self-pay | Admitting: *Deleted

## 2012-09-20 ENCOUNTER — Telehealth: Payer: Self-pay

## 2012-09-20 VITALS — BP 140/67 | HR 61 | Temp 98.7°F | Resp 18 | Ht 69.0 in | Wt 221.1 lb

## 2012-09-20 DIAGNOSIS — R197 Diarrhea, unspecified: Secondary | ICD-10-CM

## 2012-09-20 DIAGNOSIS — C19 Malignant neoplasm of rectosigmoid junction: Secondary | ICD-10-CM

## 2012-09-20 DIAGNOSIS — L270 Generalized skin eruption due to drugs and medicaments taken internally: Secondary | ICD-10-CM

## 2012-09-20 DIAGNOSIS — C189 Malignant neoplasm of colon, unspecified: Secondary | ICD-10-CM

## 2012-09-20 MED ORDER — CAPECITABINE 500 MG PO TABS: ORAL_TABLET | ORAL | Status: DC

## 2012-09-20 NOTE — Telephone Encounter (Signed)
gv and printed appt sched and avs for opt for Sept °

## 2012-09-20 NOTE — Progress Notes (Signed)
   [facility]    OFFICE PROGRESS NOTE   INTERVAL HISTORY:   She returns as scheduled. She continues to have thickening of the skin over the palms and soles. There is burning discomfort over the feet. She reports diarrhea for the past 3 evenings. She has a sore throat this morning.  Objective:  Vital signs in last 24 hours:  Blood pressure 140/67, pulse 61, temperature 98.7 F (37.1 C), temperature source Oral, resp. rate 18, height 5\' 9"  (1.753 m), weight 221 lb 1.6 oz (100.29 kg), SpO2 99.00%.    HEENT: No thrush or ulcers. Pharynx without erythema. Mild angular chelitis Resp: Lungs clear bilaterally Cardio: Regular rate and rhythm GI: No hepatomegaly, nontender Vascular: No leg edema  Skin: Skin thickening with dry desquamation over the soles. Less erythema over the palms and soles compared to when I saw her on 09/11/2012.     Lab Results:  Lab Results  Component Value Date   WBC 7.8 09/11/2012   HGB 11.4* 09/11/2012   HCT 33.0* 09/11/2012   MCV 101.7* 09/11/2012   PLT 224 09/11/2012      Medications: I have reviewed the patient's current medications.  Assessment/Plan: 1. Moderately differentiated adenocarcinoma of the rectosigmoid colon, microsatellite stable, stage IIIB (T4 N1) status post low anterior resection on 04/14/2012. She completed cycle 1 adjuvant Xeloda 05/15/2012. Cycle 2 held due to to diarrhea. She completed cycle 2 at a reduced dose (1500 mg twice daily for 14 days) beginning 06/21/2012. She began cycle 3 on 07/12/2012, cycle 4 08/02/2012, and cycle 5 08/30/2012 2. Elevated preoperative CEA. Repeat level 06/01/2012 normal at 0.8. 3. Diabetes. 4. Diarrhea coinciding with initiation of cycle 1 adjuvant Xeloda. C. difficile negative. Resolved. She did not have diarrhea with cycle 2 Xeloda. She reports nighttime diarrhea for the past 3 days 5. Nausea with intermittent vomiting. Resolved. 6. Hypokalemia on labs 06/12/2012.  Resolved. 7. Hand/foot syndrome secondary to Xeloda-partially improved  Disposition:  The hand/foot symptoms have partially improved over the past week. She reports diarrhea in the evening for the past 3 days. We decided to continue holding the Xeloda. She will begin the next cycle of Xeloda at a dose reduction (1500 mg in the am and 1000 g in the p.m.) on 09/26/2012 if the hand/foot symptoms continue to improve and the diarrhea has resolved. She will use Imodium as needed for diarrhea. She knows to contact us for increased diarrhea or hand/foot pain.  Ms. Hollenberg will return for an office visit on 10/12/2012.   Thornton Papas, MD  09/20/2012  12:05 PM

## 2012-09-26 ENCOUNTER — Telehealth: Payer: Self-pay | Admitting: *Deleted

## 2012-09-26 NOTE — Telephone Encounter (Signed)
Message from pt reporting she continues to have burning and peeling in hands and feet. Asks if she should begin next cycle of Xeloda as scheduled? Returned call to pt, she reports some improvement but palmar and plantar surfaces are burning and painful. She ordered her next cycle and will receive it on 9/5. Noticed excessive tearing of eyes over past 4 days, is this related to chemo?

## 2012-09-27 NOTE — Telephone Encounter (Signed)
Late entry for 09/26/12 1740: Per Dr. Truett Perna: Call office with an update on 9/5 before starting chemo. Pt voiced understanding.

## 2012-10-02 ENCOUNTER — Other Ambulatory Visit: Payer: Medicare PPO | Admitting: Lab

## 2012-10-02 ENCOUNTER — Ambulatory Visit: Payer: Medicare PPO | Admitting: Nurse Practitioner

## 2012-10-04 ENCOUNTER — Other Ambulatory Visit: Payer: Self-pay | Admitting: Family Medicine

## 2012-10-07 ENCOUNTER — Other Ambulatory Visit: Payer: Self-pay | Admitting: Family Medicine

## 2012-10-12 ENCOUNTER — Ambulatory Visit: Payer: Medicare PPO | Admitting: Oncology

## 2012-10-12 ENCOUNTER — Telehealth: Payer: Self-pay | Admitting: Oncology

## 2012-10-12 ENCOUNTER — Other Ambulatory Visit (HOSPITAL_BASED_OUTPATIENT_CLINIC_OR_DEPARTMENT_OTHER): Payer: Medicare PPO | Admitting: Lab

## 2012-10-12 ENCOUNTER — Ambulatory Visit (HOSPITAL_BASED_OUTPATIENT_CLINIC_OR_DEPARTMENT_OTHER): Payer: Medicare PPO | Admitting: Nurse Practitioner

## 2012-10-12 VITALS — BP 137/74 | HR 68 | Temp 97.6°F | Resp 18 | Ht 69.0 in | Wt 216.5 lb

## 2012-10-12 DIAGNOSIS — C189 Malignant neoplasm of colon, unspecified: Secondary | ICD-10-CM

## 2012-10-12 LAB — CBC WITH DIFFERENTIAL/PLATELET
Basophils Absolute: 0 10*3/uL (ref 0.0–0.1)
Eosinophils Absolute: 0 10*3/uL (ref 0.0–0.5)
HCT: 36.5 % (ref 34.8–46.6)
HGB: 12.5 g/dL (ref 11.6–15.9)
LYMPH%: 38.1 % (ref 14.0–49.7)
MCV: 102 fL — ABNORMAL HIGH (ref 79.5–101.0)
MONO%: 6.9 % (ref 0.0–14.0)
NEUT#: 3.1 10*3/uL (ref 1.5–6.5)
NEUT%: 54 % (ref 38.4–76.8)
Platelets: 243 10*3/uL (ref 145–400)

## 2012-10-12 LAB — COMPREHENSIVE METABOLIC PANEL (CC13)
Alkaline Phosphatase: 78 U/L (ref 40–150)
BUN: 18.8 mg/dL (ref 7.0–26.0)
CO2: 25 mEq/L (ref 22–29)
Glucose: 141 mg/dl — ABNORMAL HIGH (ref 70–140)
Total Bilirubin: 0.54 mg/dL (ref 0.20–1.20)

## 2012-10-12 NOTE — Telephone Encounter (Signed)
, °

## 2012-10-12 NOTE — Progress Notes (Addendum)
OFFICE PROGRESS NOTE  Interval history:  Lisa Osborne returns for scheduled followup. She began cycle 6 Xeloda on 10/03/2012. On 10/09/2012 she noted peeling of the hands and feet. She also noted mild erythema. No pain. The diarrhea has resolved. She has persistent "cold sores" at the corners of the lips. The rash over the upper chest and back has improved. She intermittently notes watery eyes. No redness or drainage. Overall appetite is good. She is intermittently "tired". She continues caring for her grandson afterschool.   Objective: Blood pressure 137/74, pulse 68, temperature 97.6 F (36.4 C), temperature source Oral, resp. rate 18, height 5\' 9"  (1.753 m), weight 216 lb 8 oz (98.204 kg).  Oropharynx is without thrush or ulceration. Mild angular key lightest. Lungs clear. No wheezes or rales. Regular cardiac rhythm. Abdomen soft and nontender. No hepatomegaly. Extremities without edema. Dry desquamation over the soles, mild erythema. Palms with mild erythema and skin thickening. No skin breakdown. Mild erythematous rash at the upper chest and back.  Lab Results: Lab Results  Component Value Date   WBC 5.8 10/12/2012   HGB 12.5 10/12/2012   HCT 36.5 10/12/2012   MCV 102.0* 10/12/2012   PLT 243 10/12/2012    Chemistry:    Chemistry      Component Value Date/Time   NA 141 10/12/2012 1025   NA 136 04/15/2012 0555   NA 142 04/01/2009   K 4.2 10/12/2012 1025   K 4.7 04/15/2012 0555   K 5.7 04/01/2009   CL 107 07/06/2012 1428   CL 100 04/15/2012 0555   CL 105 04/01/2009   CO2 25 10/12/2012 1025   CO2 27 04/15/2012 0555   BUN 18.8 10/12/2012 1025   BUN 10 04/15/2012 0555   BUN 21 04/01/2009   CREATININE 1.1 10/12/2012 1025   CREATININE 0.92 04/15/2012 0555   CREATININE 0.97 04/01/2009      Component Value Date/Time   CALCIUM 9.3 10/12/2012 1025   CALCIUM 8.9 04/15/2012 0555   CALCIUM 9.6 04/01/2009   ALKPHOS 78 10/12/2012 1025   ALKPHOS 71 03/07/2012 1554   ALKPHOS 82 04/01/2009   AST 19 10/12/2012 1025    AST 24 03/07/2012 1554   AST 22 04/01/2009   ALT 9 10/12/2012 1025   ALT 12 03/07/2012 1554   BILITOT 0.54 10/12/2012 1025   BILITOT 0.5 03/07/2012 1554   BILITOT 0.7 04/01/2009       Studies/Results: No results found.  Medications: I have reviewed the patient's current medications.  Assessment/Plan:  1. Moderately differentiated adenocarcinoma of the rectosigmoid colon, microsatellite stable, stage IIIB (T4 N1) status post low anterior resection on 04/14/2012. She completed cycle 1 adjuvant Xeloda 05/15/2012. Cycle 2 held due to to diarrhea. She completed cycle 2 at a reduced dose (1500 mg twice daily for 14 days) beginning 06/21/2012. She began cycle 3 on 07/12/2012, cycle 4 08/02/2012, cycle 5 08/30/2012, cycle 6 10/03/2012(dose reduced to 1500 mg in the morning and 1000 mg in the evening due to hand-foot syndrome). 2. Elevated preoperative CEA. Repeat level 06/01/2012 normal at 0.8. 3. Diabetes. 4. Diarrhea coinciding with initiation of cycle 1 adjuvant Xeloda. C. difficile negative. Resolved. She did not have diarrhea with cycle 2 Xeloda. She reported nighttime diarrhea for the past 3 days when here 09/20/2012. The diarrhea has resolved. 5. Nausea with intermittent vomiting. Resolved. 6. Hypokalemia on labs 06/12/2012. Resolved. 7. Hand/foot syndrome secondary to Xeloda. Improved.  Disposition-Lisa Osborne appears stable. She began cycle 6 Xeloda 10/03/2012 at a reduced dose. The  hand-foot symptoms continue to be improved.  She understands to discontinue Xeloda and contact the office if symptoms worsen.  She will begin cycle 7 Xeloda on 10/24/2012 at the current dose as long she is otherwise stable. We will see her in followup on 11/09/2012.  Patient seen with Dr. Truett Perna.  Lonna Cobb ANP/GNP-BC   This was a shared visit with Lonna Cobb. The hand/foot syndrome has improved. She will continue Xeloda at the current dose. She will discontinue Xeloda and contact us for recurrent  hand/foot pain.  Mancel Bale, M.D.

## 2012-11-02 ENCOUNTER — Other Ambulatory Visit: Payer: Self-pay | Admitting: *Deleted

## 2012-11-02 NOTE — Telephone Encounter (Signed)
THIS REFILL REQUEST FOR CAPECITABINE WAS PLACED ON DR.SHERRILL'S DESK. 

## 2012-11-03 ENCOUNTER — Other Ambulatory Visit: Payer: Self-pay | Admitting: *Deleted

## 2012-11-03 DIAGNOSIS — C189 Malignant neoplasm of colon, unspecified: Secondary | ICD-10-CM

## 2012-11-03 MED ORDER — CAPECITABINE 500 MG PO TABS: ORAL_TABLET | ORAL | Status: DC

## 2012-11-03 NOTE — Telephone Encounter (Signed)
Received refill request for Xeloda. Refill ordered, per Dr. Truett Perna. Left message on voicemail for pt to call with update on how she is tolerating this cycle.

## 2012-11-09 ENCOUNTER — Telehealth: Payer: Self-pay | Admitting: Oncology

## 2012-11-09 ENCOUNTER — Ambulatory Visit (HOSPITAL_BASED_OUTPATIENT_CLINIC_OR_DEPARTMENT_OTHER): Payer: Medicare PPO | Admitting: Oncology

## 2012-11-09 ENCOUNTER — Other Ambulatory Visit (HOSPITAL_BASED_OUTPATIENT_CLINIC_OR_DEPARTMENT_OTHER): Payer: Medicare PPO | Admitting: Lab

## 2012-11-09 ENCOUNTER — Encounter (INDEPENDENT_AMBULATORY_CARE_PROVIDER_SITE_OTHER): Payer: Self-pay

## 2012-11-09 ENCOUNTER — Other Ambulatory Visit: Payer: Self-pay | Admitting: *Deleted

## 2012-11-09 ENCOUNTER — Ambulatory Visit: Payer: Medicare PPO | Admitting: Lab

## 2012-11-09 VITALS — BP 153/70 | HR 64 | Temp 97.9°F | Resp 20 | Ht 69.0 in | Wt 223.2 lb

## 2012-11-09 DIAGNOSIS — C189 Malignant neoplasm of colon, unspecified: Secondary | ICD-10-CM

## 2012-11-09 DIAGNOSIS — Z23 Encounter for immunization: Secondary | ICD-10-CM

## 2012-11-09 DIAGNOSIS — R197 Diarrhea, unspecified: Secondary | ICD-10-CM

## 2012-11-09 DIAGNOSIS — E119 Type 2 diabetes mellitus without complications: Secondary | ICD-10-CM

## 2012-11-09 DIAGNOSIS — C19 Malignant neoplasm of rectosigmoid junction: Secondary | ICD-10-CM

## 2012-11-09 LAB — CEA: CEA: 1.3 ng/mL (ref 0.0–5.0)

## 2012-11-09 LAB — CBC WITH DIFFERENTIAL/PLATELET
Basophils Absolute: 0 10*3/uL (ref 0.0–0.1)
Eosinophils Absolute: 0 10*3/uL (ref 0.0–0.5)
HGB: 11.7 g/dL (ref 11.6–15.9)
LYMPH%: 39.7 % (ref 14.0–49.7)
MCV: 102.9 fL — ABNORMAL HIGH (ref 79.5–101.0)
MONO#: 0.4 10*3/uL (ref 0.1–0.9)
MONO%: 7.6 % (ref 0.0–14.0)
NEUT#: 3 10*3/uL (ref 1.5–6.5)
Platelets: 208 10*3/uL (ref 145–400)
RDW: 19.2 % — ABNORMAL HIGH (ref 11.2–14.5)
WBC: 5.8 10*3/uL (ref 3.9–10.3)

## 2012-11-09 LAB — COMPREHENSIVE METABOLIC PANEL (CC13)
Albumin: 3.4 g/dL — ABNORMAL LOW (ref 3.5–5.0)
Alkaline Phosphatase: 81 U/L (ref 40–150)
Anion Gap: 11 mEq/L (ref 3–11)
BUN: 14 mg/dL (ref 7.0–26.0)
CO2: 26 mEq/L (ref 22–29)
Glucose: 141 mg/dl — ABNORMAL HIGH (ref 70–140)
Potassium: 3.7 mEq/L (ref 3.5–5.1)
Sodium: 141 mEq/L (ref 136–145)
Total Protein: 7.1 g/dL (ref 6.4–8.3)

## 2012-11-09 MED ORDER — CAPECITABINE 500 MG PO TABS: ORAL_TABLET | ORAL | Status: DC

## 2012-11-09 MED ORDER — INFLUENZA VAC SPLIT QUAD 0.5 ML IM SUSP
0.5000 mL | Freq: Once | INTRAMUSCULAR | Status: AC
  Administered 2012-11-09: 0.5 mL via INTRAMUSCULAR
  Filled 2012-11-09: qty 0.5

## 2012-11-09 NOTE — Progress Notes (Signed)
    [facility]    [facility] PROGRESS NOTE   INTERVAL HISTORY:   She returns for a scheduled visit. She began the last cycle of Xeloda on 10/24/2012. No mouth sores, diarrhea, or hand/foot pain. Mild skin desquamation at the fingers. Mild angular chelitis. Good appetite and energy level.  Objective:  Vital signs in last 24 hours:  Blood pressure 153/70, pulse 64, temperature 97.9 F (36.6 C), temperature source Oral, resp. rate 20, height 5\' 9"  (1.753 m), weight 223 lb 3.2 oz (101.243 kg).    HEENT: No thrush or ulcers, mild angular chelitis Resp: Lungs clear bilaterally Cardio: Regular rate and rhythm GI: No hepatomegaly, nontender Vascular: No leg edema  Skin: Mild hyperpigmentation of the hands and feet. Dry desquamation at the feet. No ulcers. One area of mild paronychia at a left finger.     Lab Results:  Lab Results  Component Value Date   WBC 5.8 11/09/2012   HGB 11.7 11/09/2012   HCT 34.1* 11/09/2012   MCV 102.9* 11/09/2012   PLT 208 11/09/2012   ANC 3.0   Medications: I have reviewed the patient's current medications.  Assessment/Plan: 1. Moderately differentiated adenocarcinoma of the rectosigmoid colon, microsatellite stable, stage IIIB (T4 N1) status post low anterior resection on 04/14/2012. She completed cycle 1 adjuvant Xeloda 05/15/2012. Cycle 2 held due to to diarrhea. She completed cycle 2 at a reduced dose (1500 mg twice daily for 14 days) beginning 06/21/2012. She began cycle 3 on 07/12/2012, cycle 4 08/02/2012, cycle 5 08/30/2012, cycle 6 10/03/2012(dose reduced to 1500 mg in the morning and 1000 mg in the evening due to hand-foot syndrome). Cycle 7 10/24/2012. 2. Elevated preoperative CEA. Repeat level 06/01/2012 normal at 0.8. 3. Diabetes. 4. Diarrhea coinciding with initiation of cycle 1 adjuvant Xeloda. C. difficile negative. Resolved. She did not have diarrhea with cycle 2 Xeloda. She reported nighttime diarrhea for the past 3 days  when here 09/20/2012. The diarrhea has resolved. 5. Nausea with intermittent vomiting. Resolved. 6. Hypokalemia on labs 06/12/2012. Resolved. 7. Hand/foot syndrome secondary to Xeloda. Improved.   Disposition:  She tolerated cycle 7 of Xeloda well. The plan is to proceed with cycle 8 on 11/14/2012. We will check a CEA today. Ms. Fellner will return for an office visit in approximately one month.  She received an influenza vaccine today.   Thornton Papas, MD  11/09/2012  9:51 AM

## 2012-11-09 NOTE — Telephone Encounter (Signed)
Gave pt appt for ML  for November 2014, pt sent to labs today

## 2012-11-28 ENCOUNTER — Other Ambulatory Visit: Payer: Self-pay | Admitting: *Deleted

## 2012-11-28 NOTE — Telephone Encounter (Signed)
THIS REFILL REQUEST FOR CAPECITABINE WAS GIVEN TO DR.SHERRILL'S NURSE, TANYA WHITLOCK,RN. 

## 2012-11-28 NOTE — Telephone Encounter (Signed)
CAPECITABINE WAS NOT REFILLED. PT. HAS COMPLETED TREATMENT. [facility] WAS NOTIFIED VIA FAXED.

## 2012-11-30 ENCOUNTER — Ambulatory Visit: Payer: Self-pay | Admitting: Family Medicine

## 2012-11-30 ENCOUNTER — Encounter: Payer: Self-pay | Admitting: Family Medicine

## 2012-11-30 LAB — HM MAMMOGRAPHY: HM Mammogram: NEGATIVE

## 2012-12-04 ENCOUNTER — Encounter: Payer: Self-pay | Admitting: *Deleted

## 2012-12-11 ENCOUNTER — Telehealth: Payer: Self-pay | Admitting: Oncology

## 2012-12-11 ENCOUNTER — Ambulatory Visit (HOSPITAL_BASED_OUTPATIENT_CLINIC_OR_DEPARTMENT_OTHER): Payer: Medicare PPO | Admitting: Nurse Practitioner

## 2012-12-11 VITALS — BP 122/81 | HR 71 | Temp 97.7°F | Resp 18 | Ht 69.0 in | Wt 223.2 lb

## 2012-12-11 DIAGNOSIS — C189 Malignant neoplasm of colon, unspecified: Secondary | ICD-10-CM

## 2012-12-11 DIAGNOSIS — C19 Malignant neoplasm of rectosigmoid junction: Secondary | ICD-10-CM

## 2012-12-11 DIAGNOSIS — L270 Generalized skin eruption due to drugs and medicaments taken internally: Secondary | ICD-10-CM

## 2012-12-11 DIAGNOSIS — I10 Essential (primary) hypertension: Secondary | ICD-10-CM

## 2012-12-11 NOTE — Telephone Encounter (Signed)
gv and printed appt sched and avs for pt for Feb...gv pt barium   °

## 2012-12-11 NOTE — Progress Notes (Signed)
OFFICE PROGRESS NOTE  Interval history:  Lisa Osborne returns for scheduled followup. She began the eighth and final cycle of Xeloda on 11/14/2012. She denies nausea/vomiting. No mouth sores. No diarrhea. She continues to note "cold sores" at the corners of the lips. Some areas of dryness and peeling skin on the hands. Overall her skin is dry. She reports a good appetite. Energy level is beginning to improve.   Objective: Blood pressure 122/81, pulse 71, temperature 97.7 F (36.5 C), temperature source Oral, resp. rate 18, height 5\' 9"  (1.753 m), weight 223 lb 3.2 oz (101.243 kg).  Oropharynx is without thrush or ulceration. Mild angular chelitis. No palpable cervical, supraclavicular or axillary lymph nodes. Lungs are clear. Regular cardiac rhythm. Abdomen is soft and nontender. No hepatomegaly. Extremities without edema. Palms with dry, peeling skin at the base of both thumbs.  Lab Results: Lab Results  Component Value Date   WBC 5.8 11/09/2012   HGB 11.7 11/09/2012   HCT 34.1* 11/09/2012   MCV 102.9* 11/09/2012   PLT 208 11/09/2012    Chemistry:    Chemistry      Component Value Date/Time   NA 141 11/09/2012 0845   NA 136 04/15/2012 0555   NA 142 04/01/2009   K 3.7 11/09/2012 0845   K 4.7 04/15/2012 0555   K 5.7 04/01/2009   CL 107 07/06/2012 1428   CL 100 04/15/2012 0555   CL 105 04/01/2009   CO2 26 11/09/2012 0845   CO2 27 04/15/2012 0555   BUN 14.0 11/09/2012 0845   BUN 10 04/15/2012 0555   BUN 21 04/01/2009   CREATININE 1.1 11/09/2012 0845   CREATININE 0.92 04/15/2012 0555   CREATININE 0.97 04/01/2009      Component Value Date/Time   CALCIUM 8.7 11/09/2012 0845   CALCIUM 8.9 04/15/2012 0555   CALCIUM 9.6 04/01/2009   ALKPHOS 81 11/09/2012 0845   ALKPHOS 71 03/07/2012 1554   ALKPHOS 82 04/01/2009   AST 13 11/09/2012 0845   AST 24 03/07/2012 1554   AST 22 04/01/2009   ALT <6 11/09/2012 0845   ALT 12 03/07/2012 1554   BILITOT 0.24 11/09/2012 0845   BILITOT 0.5 03/07/2012 1554   BILITOT 0.7 04/01/2009       Studies/Results: No results found.  Medications: I have reviewed the patient's current medications.  Assessment/Plan:  1. Moderately differentiated adenocarcinoma of the rectosigmoid colon, microsatellite stable, stage IIIB (T4 N1) status post low anterior resection on 04/14/2012. She completed cycle 1 adjuvant Xeloda 05/15/2012. Cycle 2 held due to to diarrhea. She completed cycle 2 at a reduced dose (1500 mg twice daily for 14 days) beginning 06/21/2012. She began cycle 3 on 07/12/2012, cycle 4 08/02/2012, cycle 5 08/30/2012, cycle 6 10/03/2012(dose reduced to 1500 mg in the morning and 1000 mg in the evening due to hand-foot syndrome). Cycle 7 10/24/2012. Cycle 8 completed beginning 11/14/2012. 2. Elevated preoperative CEA. Repeat level 06/01/2012 normal at 0.8. CEA 1.3 on 11/09/2012. 3. Diabetes. 4. Diarrhea coinciding with initiation of cycle 1 adjuvant Xeloda. C. difficile negative. Resolved. She did not have diarrhea with cycle 2 Xeloda. She reported nighttime diarrhea for the past 3 days when here 09/20/2012. The diarrhea has resolved. 5. Nausea with intermittent vomiting. Resolved. 6. Hypokalemia on labs 06/12/2012. Resolved. 7. Hand/foot syndrome secondary to Xeloda. Improved.  Disposition-she has completed the course of adjuvant Xeloda chemotherapy. Dr. Truett Perna recommends CT scans chest/abdomen/pelvis annually for 3 years beginning February 2015. She will return for a followup visit February  2015 approximately 1 week after labs/CT scans to review the results. She will contact the office in the interim with any problems.  We made a referral to Dr. Arlyce Dice for a one-year colonoscopy in February 2015.   Plan reviewed with Dr. Truett Perna.  Lisa Osborne ANP/GNP-BC

## 2012-12-29 ENCOUNTER — Ambulatory Visit (INDEPENDENT_AMBULATORY_CARE_PROVIDER_SITE_OTHER): Payer: Medicare PPO | Admitting: Family Medicine

## 2012-12-29 ENCOUNTER — Encounter: Payer: Self-pay | Admitting: Family Medicine

## 2012-12-29 VITALS — BP 134/82 | HR 80 | Temp 98.4°F | Wt 223.2 lb

## 2012-12-29 DIAGNOSIS — J209 Acute bronchitis, unspecified: Secondary | ICD-10-CM

## 2012-12-29 DIAGNOSIS — K130 Diseases of lips: Secondary | ICD-10-CM

## 2012-12-29 MED ORDER — AZITHROMYCIN 250 MG PO TABS: ORAL_TABLET | ORAL | Status: DC

## 2012-12-29 MED ORDER — CLOTRIMAZOLE 1 % EX CREA: 1.0000 "application " | TOPICAL_CREAM | Freq: Two times a day (BID) | CUTANEOUS | Status: DC

## 2012-12-29 MED ORDER — GUAIFENESIN-CODEINE 100-10 MG/5ML PO SYRP: 5.0000 mL | ORAL_SOLUTION | Freq: Every evening | ORAL | Status: DC | PRN

## 2012-12-29 NOTE — Patient Instructions (Signed)
I do think you have bronchitis.  Treat with zpack.  May use codeine syrup for cough at night. For lips - try lotrimin daily and start b complex vitamin. Watch for fever >101, worsening productive cough or not improving as expected

## 2012-12-29 NOTE — Progress Notes (Signed)
Pre-visit discussion using our clinic review tool. No additional management support is needed unless otherwise documented below in the visit note.  

## 2012-12-29 NOTE — Progress Notes (Signed)
   Subjective:    Patient ID: Lisa Osborne, female    DOB: 20-Apr-1940, 72 y.o.   MRN: [id number]  HPI CC: URI?  Recent dx colon cancer this year s/p LAR resection by Dr. Michaell Cowing.  She finished chemotherapy last month (last pill was 11/29/2012).  Now with 3 wk h/o cold associated with cold sores - using abreva daily for these.  Cough productive of mucous.  Worse at night time - "tickle".  ++PNDrainage and chest tightness with dyspnea and wheezing. Initially with chills but that has improved.  Main congestion is in chest. No fevers/chills, ear or tooth pain, abd pain, rashes, nausea.  No HA or facial pressure.   Grandson sick recently. Did receive flu shot. No smokers at home. No h/o asthma.  She also notices cold sores on edges of lips over last month (and also while she was on chemo) unresponsive to abreva.  Past Medical History  Diagnosis Date  . History of iron deficiency anemia   . Arthritis     left side lower back pain  . History of chicken pox   . Depression     severe in past  . Diabetes mellitus type 2, controlled 2006    diet controlled  . GERD (gastroesophageal reflux disease)     bad if off PPI  . Seasonal allergies   . Hypothyroidism     mild  . Urine incontinence     mild, wears pad  . Unilateral deafnesses     left ear deaf, wears hearing aid right side  . Rosacea   . Anemia   . Colon adenocarcinoma - rectosigmoid T3N1 (1/15) s/p lap LAR 04/14/2012 04/14/2012    Status post laparoscopic lower anterior resection     Review of Systems Per HPI    Objective:   Physical Exam  Nursing note and vitals reviewed. Constitutional: She appears well-developed and well-nourished. No distress.  HENT:  Head: Normocephalic and atraumatic.  Right Ear: External ear normal.  Left Ear: External ear normal.  Nose: No mucosal edema or rhinorrhea. Right sinus exhibits no maxillary sinus tenderness and no frontal sinus tenderness. Left sinus exhibits no maxillary sinus  tenderness and no frontal sinus tenderness.  Mouth/Throat: Uvula is midline, oropharynx is clear and moist and mucous membranes are normal. No oropharyngeal exudate.  Hoarse voice Peeling with small erythematous abrasions on corners of lips  Eyes: Conjunctivae and EOM are normal. Pupils are equal, round, and reactive to light. No scleral icterus.  Neck: Normal range of motion. Neck supple.  Cardiovascular: Normal rate, regular rhythm, normal heart sounds and intact distal pulses.   No murmur heard. Pulmonary/Chest: Effort normal and breath sounds normal. No respiratory distress. She has no wheezes. She has no rales.  Musculoskeletal: She exhibits no edema.  Lymphadenopathy:    She has no cervical adenopathy.  Skin: Skin is warm and dry. No rash noted.       Assessment & Plan:

## 2012-12-29 NOTE — Assessment & Plan Note (Signed)
Rash has not responded to abreva - anticipate more angular cheilitis after chemotherapy. Recommended lotrimin to rash, also recommended start B complex vitamin.

## 2012-12-29 NOTE — Assessment & Plan Note (Signed)
Given recent chemotherapy, cover for atypicals with zpack. cheratussin for cough at night time. Red flags to return discussed.

## 2013-01-05 ENCOUNTER — Encounter: Payer: Self-pay | Admitting: Gastroenterology

## 2013-02-14 ENCOUNTER — Ambulatory Visit (INDEPENDENT_AMBULATORY_CARE_PROVIDER_SITE_OTHER): Payer: Medicare PPO | Admitting: Gastroenterology

## 2013-02-14 ENCOUNTER — Ambulatory Visit: Payer: Medicare PPO | Admitting: Gastroenterology

## 2013-02-14 ENCOUNTER — Encounter: Payer: Self-pay | Admitting: Gastroenterology

## 2013-02-14 VITALS — BP 124/80 | HR 76 | Ht 69.0 in | Wt 230.4 lb

## 2013-02-14 DIAGNOSIS — C189 Malignant neoplasm of colon, unspecified: Secondary | ICD-10-CM

## 2013-02-14 MED ORDER — PEG-KCL-NACL-NASULF-NA ASC-C 100 G PO SOLR: 1.0000 | Freq: Once | ORAL | Status: DC

## 2013-02-14 NOTE — Patient Instructions (Signed)
You have been scheduled for a colonoscopy with propofol. Please follow written instructions given to you at your visit today.  Please pick up your prep kit at the pharmacy within the next 1-3 days. If you use inhalers (even only as needed), please bring them with you on the day of your procedure. Your physician has requested that you go to www.startemmi.com and enter the access code given to you at your visit today. This web site gives a general overview about your procedure. However, you should still follow specific instructions given to you by our office regarding your preparation for the procedure.  We have given you a Moviprep coupon

## 2013-02-14 NOTE — Assessment & Plan Note (Signed)
Plan followup colonoscopy 

## 2013-02-14 NOTE — Progress Notes (Signed)
          History of Present Illness:  Patient has returned for one year followup.  She status post colon resection for a rectosigmoid T3 N1 lesion.  This was followed by chemotherapy which and in in November, 2014.  Except for sleepiness she has no complaints.  Altogether she is feeling well.    Review of Systems: Pertinent positive and negative review of systems were noted in the above HPI section. All other review of systems were otherwise negative.    Current Medications, Allergies, Past Medical History, Past Surgical History, Family History and Social History were reviewed in [facility] record  Vital signs were reviewed in today's medical record. Physical Exam: General: Well developed , well nourished, no acute distress Skin: anicteric Head: Normocephalic and atraumatic Eyes:  sclerae anicteric, EOMI Ears: Normal auditory acuity Mouth: No deformity or lesions Lungs: Clear throughout to auscultation Heart: Regular rate and rhythm; no murmurs, rubs or bruits Abdomen: Soft, non tender and non distended. No masses, hepatosplenomegaly or hernias noted. Normal Bowel sounds Rectal:deferred Musculoskeletal: Symmetrical with no gross deformities  Pulses:  Normal pulses noted Extremities: No clubbing, cyanosis, edema or deformities noted Neurological: Alert oriented x 4, grossly nonfocal Psychological:  Alert and cooperative. Normal mood and affect  See Assessment and Plan under Problem List

## 2013-02-21 ENCOUNTER — Encounter: Payer: Self-pay | Admitting: Gastroenterology

## 2013-03-05 ENCOUNTER — Ambulatory Visit (HOSPITAL_COMMUNITY)
Admission: RE | Admit: 2013-03-05 | Discharge: 2013-03-05 | Disposition: A | Discharge status: Home / Self Care | Payer: Medicare PPO | Source: Ambulatory Visit | Attending: Nurse Practitioner | Admitting: Nurse Practitioner

## 2013-03-05 ENCOUNTER — Other Ambulatory Visit (HOSPITAL_BASED_OUTPATIENT_CLINIC_OR_DEPARTMENT_OTHER): Payer: Medicare PPO

## 2013-03-05 ENCOUNTER — Encounter (HOSPITAL_COMMUNITY): Payer: Self-pay

## 2013-03-05 ENCOUNTER — Other Ambulatory Visit: Payer: Medicare PPO | Admitting: Lab

## 2013-03-05 DIAGNOSIS — Z90710 Acquired absence of both cervix and uterus: Secondary | ICD-10-CM | POA: Insufficient documentation

## 2013-03-05 DIAGNOSIS — K449 Diaphragmatic hernia without obstruction or gangrene: Secondary | ICD-10-CM | POA: Insufficient documentation

## 2013-03-05 DIAGNOSIS — C189 Malignant neoplasm of colon, unspecified: Secondary | ICD-10-CM

## 2013-03-05 DIAGNOSIS — Z85038 Personal history of other malignant neoplasm of large intestine: Secondary | ICD-10-CM | POA: Insufficient documentation

## 2013-03-05 DIAGNOSIS — Z980 Intestinal bypass and anastomosis status: Secondary | ICD-10-CM | POA: Insufficient documentation

## 2013-03-05 DIAGNOSIS — C19 Malignant neoplasm of rectosigmoid junction: Secondary | ICD-10-CM

## 2013-03-05 DIAGNOSIS — K7689 Other specified diseases of liver: Secondary | ICD-10-CM | POA: Insufficient documentation

## 2013-03-05 LAB — CBC WITH DIFFERENTIAL/PLATELET
BASO%: 0.1 % (ref 0.0–2.0)
BASOS ABS: 0 10*3/uL (ref 0.0–0.1)
EOS%: 0.3 % (ref 0.0–7.0)
Eosinophils Absolute: 0 10*3/uL (ref 0.0–0.5)
HCT: 39.3 % (ref 34.8–46.6)
HEMOGLOBIN: 12.9 g/dL (ref 11.6–15.9)
LYMPH%: 38.5 % (ref 14.0–49.7)
MCH: 31.1 pg (ref 25.1–34.0)
MCHC: 32.8 g/dL (ref 31.5–36.0)
MCV: 94.7 fL (ref 79.5–101.0)
MONO#: 0.5 10*3/uL (ref 0.1–0.9)
MONO%: 7.2 % (ref 0.0–14.0)
NEUT%: 53.9 % (ref 38.4–76.8)
NEUTROS ABS: 3.8 10*3/uL (ref 1.5–6.5)
Platelets: 258 10*3/uL (ref 145–400)
RBC: 4.15 10*6/uL (ref 3.70–5.45)
RDW: 13.4 % (ref 11.2–14.5)
WBC: 7 10*3/uL (ref 3.9–10.3)
lymph#: 2.7 10*3/uL (ref 0.9–3.3)

## 2013-03-05 LAB — COMPREHENSIVE METABOLIC PANEL (CC13)
ALK PHOS: 84 U/L (ref 40–150)
ALT: 13 U/L (ref 0–55)
AST: 17 U/L (ref 5–34)
Albumin: 3.9 g/dL (ref 3.5–5.0)
Anion Gap: 11 mEq/L (ref 3–11)
BUN: 14.9 mg/dL (ref 7.0–26.0)
CALCIUM: 9.6 mg/dL (ref 8.4–10.4)
CO2: 24 mEq/L (ref 22–29)
Chloride: 103 mEq/L (ref 98–109)
Creatinine: 1.1 mg/dL (ref 0.6–1.1)
Glucose: 135 mg/dl (ref 70–140)
Potassium: 3.9 mEq/L (ref 3.5–5.1)
Sodium: 139 mEq/L (ref 136–145)
TOTAL PROTEIN: 7.6 g/dL (ref 6.4–8.3)
Total Bilirubin: 0.4 mg/dL (ref 0.20–1.20)

## 2013-03-05 MED ORDER — IOHEXOL 300 MG/ML  SOLN
100.0000 mL | Freq: Once | INTRAMUSCULAR | Status: AC | PRN
  Administered 2013-03-05: 100 mL via INTRAVENOUS

## 2013-03-06 LAB — CEA: CEA: 1.2 ng/mL (ref 0.0–5.0)

## 2013-03-12 ENCOUNTER — Telehealth: Payer: Self-pay | Admitting: Oncology

## 2013-03-12 ENCOUNTER — Other Ambulatory Visit: Payer: Self-pay | Admitting: *Deleted

## 2013-03-12 ENCOUNTER — Ambulatory Visit (HOSPITAL_BASED_OUTPATIENT_CLINIC_OR_DEPARTMENT_OTHER): Payer: Medicare PPO | Admitting: Oncology

## 2013-03-12 VITALS — BP 156/79 | HR 78 | Temp 98.7°F | Resp 17 | Ht 69.0 in | Wt 229.1 lb

## 2013-03-12 DIAGNOSIS — E119 Type 2 diabetes mellitus without complications: Secondary | ICD-10-CM

## 2013-03-12 DIAGNOSIS — C189 Malignant neoplasm of colon, unspecified: Secondary | ICD-10-CM

## 2013-03-12 NOTE — Telephone Encounter (Signed)
gv pt appt schedule for august

## 2013-03-12 NOTE — Progress Notes (Signed)
   Lisa Osborne    OFFICE PROGRESS NOTE   INTERVAL HISTORY:   Lisa Osborne returns for scheduled followup of colon cancer. Her only complaint is malaise. No difficulty with bowel function. No other complaint. She is scheduled for colonoscopy next month.  Objective:  Vital signs in last 24 hours:  Blood pressure 156/79, pulse 78, temperature 98.7 F (37.1 C), temperature source Oral, resp. rate 17, height $RemoveBe'5\' 9"'XmYTlqohx$  (1.753 m), weight 229 lb 1.6 oz (103.919 kg), SpO2 96.00%.    HEENT: Neck without mass Lymphatics: No cervical, supraclavicular, axillary, or inguinal nodes Resp: Lungs clear bilaterally Cardio: Regular rate and rhythm GI: No hepatomegaly, nontender, no mass Vascular: No leg edema  Skin: Hands without skin thickening or erythema   Lab Results:  Lab Results  Component Value Date   WBC 7.0 03/05/2013   HGB 12.9 03/05/2013   HCT 39.3 03/05/2013   MCV 94.7 03/05/2013   PLT 258 03/05/2013   NEUTROABS 3.8 03/05/2013   CEA 1.2  X-rays: CT of the chest on 03/05/2013, compared to 12/11/2012: Liver lesions are unchanged and most consistent with cysts. No new liver lesion. No lymphadenopathy. Negative for metastatic or recurrent disease  Medications: I have reviewed the patient's current medications.  Assessment/Plan: 1. Moderately differentiated adenocarcinoma of the rectosigmoid colon, microsatellite stable, stage IIIB (T4 N1) status post low anterior resection on 04/14/2012. She completed cycle 1 adjuvant Xeloda 05/15/2012. Cycle 2 held due to to diarrhea. She completed cycle 2 at a reduced dose (1500 mg twice daily for 14 days) beginning 06/21/2012. She began cycle 3 on 07/12/2012, cycle 4 08/02/2012, cycle 5 08/30/2012, cycle 6 10/03/2012(dose reduced to 1500 mg in the morning and 1000 mg in the evening due to hand-foot syndrome). Cycle 7 10/24/2012. Cycle 8 completed beginning 11/14/2012.  CTs of chest, abdomen, and pelvis 03/05/2013-negative 2. Elevated preoperative  CEA. 3. Diabetes. 4. Diarrhea coinciding with initiation of cycle 1 adjuvant Xeloda. C. difficile negative. Resolved. She did not have diarrhea with cycle 2 Xeloda. She reported nighttime diarrhea for the past 3 days when here 09/20/2012. The diarrhea has resolved. 5. Nausea with intermittent vomiting. Resolved. 6. Hypokalemia on labs 06/12/2012. Resolved. 7. Hand/foot syndrome secondary to Xeloda. Resolved 8. History of anemia-the hemoglobin is normal, she will discontinue iron  Disposition:  Lisa Osborne is in clinical remission from colon cancer. She will return for an office visit and CEA in 6 months. She will undergo a colonoscopy next month.   Betsy Coder, MD  03/12/2013  11:15 AM

## 2013-03-25 HISTORY — PX: COLONOSCOPY: SHX174

## 2013-03-28 ENCOUNTER — Other Ambulatory Visit: Payer: Self-pay | Admitting: Family Medicine

## 2013-04-04 ENCOUNTER — Other Ambulatory Visit: Payer: Self-pay | Admitting: Family Medicine

## 2013-04-05 ENCOUNTER — Ambulatory Visit (AMBULATORY_SURGERY_CENTER): Payer: Medicare PPO | Admitting: Gastroenterology

## 2013-04-05 ENCOUNTER — Encounter: Payer: Self-pay | Admitting: Gastroenterology

## 2013-04-05 VITALS — BP 138/70 | HR 59 | Temp 99.0°F | Resp 29 | Ht 69.0 in | Wt 230.0 lb

## 2013-04-05 DIAGNOSIS — D126 Benign neoplasm of colon, unspecified: Secondary | ICD-10-CM

## 2013-04-05 DIAGNOSIS — Z85038 Personal history of other malignant neoplasm of large intestine: Secondary | ICD-10-CM

## 2013-04-05 MED ORDER — SODIUM CHLORIDE 0.9 % IV SOLN: 500.0000 mL | INTRAVENOUS | Status: DC

## 2013-04-05 NOTE — Progress Notes (Signed)
Called to room to assist during endoscopic procedure.  Patient ID and intended procedure confirmed with present staff. Received instructions for my participation in the procedure from the performing physician.  

## 2013-04-05 NOTE — Op Note (Signed)
[place]  [address]. [place], [address]   COLONOSCOPY PROCEDURE REPORT  PATIENT: Lisa Osborne, Lisa Osborne  MR#: [id number] BIRTHDATE: 1941-01-03 , 72  yrs. old GENDER: Female ENDOSCOPIST: Inda Castle, MD REFERRED GG:YIRSWN Gutierrez, M.D. PROCEDURE DATE:  04/05/2013 PROCEDURE:   Colonoscopy with snare polypectomy First Screening Colonoscopy - Avg.  risk and is 50 yrs.  old or older - No.  Prior Negative Screening - Now for repeat screening. N/A  History of Adenoma - Now for follow-up colonoscopy & has been > or = to 3 yrs.  No.  It has been less than 3 yrs since last colonoscopy.  Other: See Comments  Polyps Removed Today? Yes. ASA CLASS:   Class II INDICATIONS:High risk patient with personal history of colon cancer.2014 MEDICATIONS: MAC sedation, administered by CRNA and propofol (Diprivan) 150mg  IV  DESCRIPTION OF PROCEDURE:   After the risks benefits and alternatives of the procedure were thoroughly explained, informed consent was obtained.  A digital rectal exam revealed no abnormalities of the rectum.   The LB [id number]  endoscope was introduced through the anus and advanced to the cecum, which was identified by both the appendix and ileocecal valve. No adverse events experienced.   The quality of the prep was Suprep good  The instrument was then slowly withdrawn as the colon was fully examined.      COLON FINDINGS: Anastomosis was seen at 20 cm from the anus.Two sessile polyps measuring 3 mm in size were found in the descending colon.  A polypectomy was performed with a cold snare.  The resection was complete and the polyp tissue was completely retrieved.   Mild diverticulosis was noted in the descending colon. The colon was otherwise normal.  There was no diverticulosis, inflammation, polyps or cancers unless previously stated. Retroflexed views revealed no abnormalities. The time to cecum=2 minutes 50 seconds.  Withdrawal time=9  minutes 33 seconds.  The scope was withdrawn and the procedure completed. COMPLICATIONS: There were no complications.  ENDOSCOPIC IMPRESSION: 1.   Two sessile polyps measuring 3 mm in size were found in the descending colon; polypectomy was performed with a cold snare 2.   Mild diverticulosis was noted in the descending colon 3.   The colon was otherwise normal  RECOMMENDATIONS: Colonoscopy 3 years   eSigned:  Inda Castle, MD 04/05/2013 9:21 AM   cc:   PATIENT NAME:  [name], [name] MR#: [id number]

## 2013-04-05 NOTE — Progress Notes (Signed)
Lidocaine-40mg IV prior to Propofol InductionPropofol given over incremental dosages 

## 2013-04-05 NOTE — Patient Instructions (Signed)
YOU HAD AN ENDOSCOPIC PROCEDURE TODAY AT THE [facility]: Refer to the procedure report that was given to you for any specific questions about what was found during the examination.  If the procedure report does not answer your questions, please call your gastroenterologist to clarify.  If you requested that your care partner not be given the details of your procedure findings, then the procedure report has been included in a sealed envelope for you to review at your convenience later.  YOU SHOULD EXPECT: Some feelings of bloating in the abdomen. Passage of more gas than usual.  Walking can help get rid of the air that was put into your GI tract during the procedure and reduce the bloating. If you had a lower endoscopy (such as a colonoscopy or flexible sigmoidoscopy) you may notice spotting of blood in your stool or on the toilet paper. If you underwent a bowel prep for your procedure, then you may not have a normal bowel movement for a few days.  DIET: Your first meal following the procedure should be a light meal and then it is ok to progress to your normal diet.  A half-sandwich or bowl of soup is an example of a good first meal.  Heavy or fried foods are harder to digest and may make you feel nauseous or bloated.  Likewise meals heavy in dairy and vegetables can cause extra gas to form and this can also increase the bloating.  Drink plenty of fluids but you should avoid alcoholic beverages for 24 hours.  ACTIVITY: Your care partner should take you home directly after the procedure.  You should plan to take it easy, moving slowly for the rest of the day.  You can resume normal activity the day after the procedure however you should NOT DRIVE or use heavy machinery for 24 hours (because of the sedation medicines used during the test).    SYMPTOMS TO REPORT IMMEDIATELY: A gastroenterologist can be reached at any hour.  During normal business hours, 8:30 AM to 5:00 PM Monday through Friday,  call [phone].  After hours and on weekends, please call the GI answering service at [phone] who will take a message and have the physician on call contact you.   Following lower endoscopy (colonoscopy or flexible sigmoidoscopy):  Excessive amounts of blood in the stool  Significant tenderness or worsening of abdominal pains  Swelling of the abdomen that is new, acute  Fever of 100F or higher    FOLLOW UP: If any biopsies were taken you will be contacted by phone or by letter within the next 1-3 weeks.  Call your gastroenterologist if you have not heard about the biopsies in 3 weeks.  Our staff will call the home number listed on your records the next business day following your procedure to check on you and address any questions or concerns that you may have at that time regarding the information given to you following your procedure. This is a courtesy call and so if there is no answer at the home number and we have not heard from you through the emergency physician on call, we will assume that you have returned to your regular daily activities without incident.  SIGNATURES/CONFIDENTIALITY: You and/or your care partner have signed paperwork which will be entered into your electronic medical record.  These signatures attest to the fact that that the information above on your After Visit Summary has been reviewed and is understood.  Full responsibility of the confidentiality   of this discharge information lies with you and/or your care-partner.  Polyp, diverticulosis, and high fiber diet information given.  Next colonoscopy 3 years-2018

## 2013-04-06 ENCOUNTER — Telehealth: Payer: Self-pay | Admitting: *Deleted

## 2013-04-06 NOTE — Telephone Encounter (Signed)
  Follow up Call-  Call back number 04/05/2013 03/07/2012  Post procedure Call Back phone  # [phone]  Permission to leave phone message Yes Yes    Spoke with pt's daughter; pt unavailable but doing fine Patient questions:  Do you have a fever, pain , or abdominal swelling? no Pain Score  0 *  Have you tolerated food without any problems? yes  Have you been able to return to your normal activities? yes  Do you have any questions about your discharge instructions: Diet   no Medications  no Follow up visit  no  Do you have questions or concerns about your Care? no  Actions: * If pain score is 4 or above: No action needed, pain <4.

## 2013-04-11 ENCOUNTER — Encounter: Payer: Self-pay | Admitting: Gastroenterology

## 2013-04-13 ENCOUNTER — Encounter: Payer: Self-pay | Admitting: Family Medicine

## 2013-04-25 ENCOUNTER — Other Ambulatory Visit: Payer: Self-pay | Admitting: Family Medicine

## 2013-05-03 ENCOUNTER — Other Ambulatory Visit: Payer: Self-pay | Admitting: Family Medicine

## 2013-05-19 ENCOUNTER — Other Ambulatory Visit: Payer: Self-pay | Admitting: Family Medicine

## 2013-05-22 ENCOUNTER — Ambulatory Visit (INDEPENDENT_AMBULATORY_CARE_PROVIDER_SITE_OTHER): Payer: Medicare PPO | Admitting: Family Medicine

## 2013-05-22 ENCOUNTER — Encounter: Payer: Self-pay | Admitting: Family Medicine

## 2013-05-22 VITALS — BP 126/82 | HR 84 | Temp 98.1°F | Wt 229.8 lb

## 2013-05-22 DIAGNOSIS — L255 Unspecified contact dermatitis due to plants, except food: Secondary | ICD-10-CM

## 2013-05-22 DIAGNOSIS — L237 Allergic contact dermatitis due to plants, except food: Secondary | ICD-10-CM | POA: Insufficient documentation

## 2013-05-22 DIAGNOSIS — E119 Type 2 diabetes mellitus without complications: Secondary | ICD-10-CM

## 2013-05-22 DIAGNOSIS — Z23 Encounter for immunization: Secondary | ICD-10-CM

## 2013-05-22 DIAGNOSIS — C189 Malignant neoplasm of colon, unspecified: Secondary | ICD-10-CM

## 2013-05-22 DIAGNOSIS — E039 Hypothyroidism, unspecified: Secondary | ICD-10-CM

## 2013-05-22 LAB — MICROALBUMIN / CREATININE URINE RATIO
Creatinine,U: 343.9 mg/dL
MICROALB UR: 1.1 mg/dL (ref 0.0–1.9)
Microalb Creat Ratio: 0.3 mg/g (ref 0.0–30.0)

## 2013-05-22 LAB — TSH: TSH: 1.56 u[IU]/mL (ref 0.35–5.50)

## 2013-05-22 LAB — HEMOGLOBIN A1C: Hgb A1c MFr Bld: 6.3 % (ref 4.6–6.5)

## 2013-05-22 MED ORDER — LANSOPRAZOLE 15 MG PO CPDR: 15.0000 mg | DELAYED_RELEASE_CAPSULE | Freq: Every day | ORAL | Status: DC

## 2013-05-22 MED ORDER — LEVOTHYROXINE SODIUM 25 MCG PO TABS: ORAL_TABLET | ORAL | Status: DC

## 2013-05-22 MED ORDER — SERTRALINE HCL 50 MG PO TABS: 50.0000 mg | ORAL_TABLET | Freq: Every day | ORAL | Status: DC

## 2013-05-22 MED ORDER — PREDNISONE 20 MG PO TABS: 20.0000 mg | ORAL_TABLET | Freq: Every day | ORAL | Status: DC

## 2013-05-22 NOTE — Assessment & Plan Note (Signed)
Stable as of latest colonoscopy.

## 2013-05-22 NOTE — Assessment & Plan Note (Addendum)
Persistent new spots - so will treat with oral prednisone. Discussed hyperglycemia precautions on prednisone. rec avoid carbs while on prednisone. States has tolerated oral steroids well in past.

## 2013-05-22 NOTE — Assessment & Plan Note (Signed)
Chronic, stable on low dose levothryoxine. Discussed brand vs generic thyroid replacement. asxs.

## 2013-05-22 NOTE — Addendum Note (Signed)
Addended by: Royann Shivers A on: 05/22/2013 10:57 AM   Modules accepted: Orders

## 2013-05-22 NOTE — Patient Instructions (Signed)
prevnar today (2nd and final pneumonia shot) Blood work today. meds refilled today. Short prednisone course to help with poison ivy - but watch carbs while on prednisone. Good to see you today, return as needed or in 6-8 months for follow up (and physical).

## 2013-05-22 NOTE — Progress Notes (Signed)
BP 126/82  Pulse 84  Temp(Src) 98.1 F (36.7 C) (Oral)  Wt 229 lb 12 oz (104.214 kg)   CC: med refill  Subjective:    Patient ID: Lisa Osborne, female    DOB: [birth year], 73 y.o.   MRN: [id number]  HPI: Lisa Osborne is a 73 y.o. female presenting on 05/22/2013 for Medication Refill and [place] ago while working in yard got exposed to poison ivy - wiped face with gloves.  Rash on face, arms, trunk.  Using benadryl and anti itch lotion.  Seems to be getting better.  Did see [facility] and received steroid shot, didn't really seem to help.  New spots developing.  Has taken oral prednisone well in past.  Med refill - requests levothyroxine refilled as well as sertraline and prevacid.  Denies weight changes, hair/skin changes, diarrhea or constipation, heat or cold intolerance  DM - regularly does not check sugars.  Compliant with antihyperglycemic regimen which includes: diet controlled.  Denies low sugars or hypoglycemic symptoms.  Minimal great toe paresthesias. Last diabetic eye exam - not done.  Pneumovax: 12/2006.  Prevnar: today.   H/o colon cancer s/p LAR.  Rpt colonoscopy 1 yr later with polyps, rec rpt 3 yrs [name]).  Relevant past medical, surgical, family and social history reviewed and updated as indicated.  Allergies and medications reviewed and updated. Current Outpatient Prescriptions on File Prior to Visit  Medication Sig  . Calcium Citrate-Vitamin D (CALCIUM CITRATE + D PO) Take by mouth daily.  . Coenzyme Q10 (COQ10) 200 MG CAPS Take by mouth.  . loratadine (CLARITIN) 10 MG tablet Take 10 mg by mouth daily as needed for allergies.  . Omega-3 Fatty Acids (FISH OIL) 1000 MG CAPS Take 1 capsule by mouth daily.  . polycarbophil (FIBERCON) 625 MG tablet Take 625 mg by mouth daily. Not sure of dose  . vitamin B-12 (CYANOCOBALAMIN) 1000 MCG tablet Take 1,000 mcg by mouth daily.  . [DISCONTINUED] oxybutynin (DITROPAN) 5 MG tablet  Take 1 tablet (5 mg total) by mouth 3 (three) times daily as needed.   No current facility-administered medications on file prior to visit.    Review of Systems Per HPI unless specifically indicated above    Objective:    BP 126/82  Pulse 84  Temp(Src) 98.1 F (36.7 C) (Oral)  Wt 229 lb 12 oz (104.214 kg)  Physical Exam  Nursing note and vitals reviewed. Constitutional: She appears well-developed and well-nourished. No distress.  HENT:  Head: Normocephalic and atraumatic.  Right Ear: External ear normal.  Left Ear: External ear normal.  Nose: Nose normal.  Mouth/Throat: Oropharynx is clear and moist. No oropharyngeal exudate.  Eyes: Conjunctivae and EOM are normal. Pupils are equal, round, and reactive to light. No scleral icterus.  Neck: Normal range of motion. Neck supple.  Cardiovascular: Normal rate, regular rhythm, normal heart sounds and intact distal pulses.   No murmur heard. Pulmonary/Chest: Effort normal and breath sounds normal. No respiratory distress. She has no wheezes. She has no rales.  Musculoskeletal: She exhibits no edema.  Diabetic foot exam: Normal inspection No skin breakdown No calluses  Normal DP/PT pulses Normal sensation to light touch and monofilament Nails normal  Lymphadenopathy:    She has no cervical adenopathy.  Skin: Skin is warm and dry. No rash noted.  Psychiatric: She has a normal mood and affect.       Assessment & Plan:   Problem  List Items Addressed This Visit   Diabetes mellitus type 2, controlled     Chronic, stable. Continue to monitor. Check A1c today and micro alb. rec schedule eye exam. Foot exam today. Anticipate persistently good control.    Relevant Orders      Hemoglobin A1c      Microalbumin / creatinine urine ratio      HM DIABETES FOOT EXAM (Completed)   Hypothyroidism - Primary     Chronic, stable on low dose levothryoxine. Discussed brand vs generic thyroid replacement. asxs.    Relevant Medications       levothyroxine (SYNTHROID, LEVOTHROID) tablet   Other Relevant Orders      TSH   Colon adenocarcinoma - rectosigmoid T3N1 (1/15) s/p lap LAR 04/14/2012     Stable as of latest colonoscopy.    Poison ivy dermatitis     Persistent new spots - so will treat with oral prednisone. Discussed hyperglycemia precautions on prednisone. rec avoid carbs while on prednisone. States has tolerated oral steroids well in past.        Follow up plan: Return in about 6 months (around 11/21/2013), or if symptoms worsen or fail to improve, for annual exam, prior fasting for blood work.

## 2013-05-22 NOTE — Progress Notes (Signed)
Pre visit review using our clinic review tool, if applicable. No additional management support is needed unless otherwise documented below in the visit note. 

## 2013-05-22 NOTE — Assessment & Plan Note (Signed)
Chronic, stable. Continue to monitor. Check A1c today and micro alb. rec schedule eye exam. Foot exam today. Anticipate persistently good control.

## 2013-05-23 ENCOUNTER — Encounter: Payer: Self-pay | Admitting: *Deleted

## 2013-05-25 ENCOUNTER — Telehealth: Payer: Self-pay

## 2013-05-25 NOTE — Telephone Encounter (Signed)
Relevant patient education mailed to patient.  

## 2013-08-08 ENCOUNTER — Telehealth: Payer: Self-pay | Admitting: *Deleted

## 2013-08-08 NOTE — Telephone Encounter (Signed)
Lm on pts vm requesting a call back to schedule DIABETIC BUNDLE LAB-needs LDL

## 2013-08-15 ENCOUNTER — Other Ambulatory Visit (INDEPENDENT_AMBULATORY_CARE_PROVIDER_SITE_OTHER): Payer: Medicare PPO

## 2013-08-15 DIAGNOSIS — E119 Type 2 diabetes mellitus without complications: Secondary | ICD-10-CM

## 2013-08-15 LAB — CBC WITH DIFFERENTIAL/PLATELET
Basophils Absolute: 0 10*3/uL (ref 0.0–0.1)
Basophils Relative: 0.3 % (ref 0.0–3.0)
EOS PCT: 0.6 % (ref 0.0–5.0)
Eosinophils Absolute: 0 10*3/uL (ref 0.0–0.7)
HCT: 35.5 % — ABNORMAL LOW (ref 36.0–46.0)
HEMOGLOBIN: 11.8 g/dL — AB (ref 12.0–15.0)
Lymphocytes Relative: 39.5 % (ref 12.0–46.0)
Lymphs Abs: 2 10*3/uL (ref 0.7–4.0)
MCHC: 33.3 g/dL (ref 30.0–36.0)
MCV: 92.8 fl (ref 78.0–100.0)
MONO ABS: 0.4 10*3/uL (ref 0.1–1.0)
Monocytes Relative: 8.3 % (ref 3.0–12.0)
NEUTROS PCT: 51.3 % (ref 43.0–77.0)
Neutro Abs: 2.6 10*3/uL (ref 1.4–7.7)
Platelets: 200 10*3/uL (ref 150.0–400.0)
RBC: 3.82 Mil/uL — ABNORMAL LOW (ref 3.87–5.11)
RDW: 14.1 % (ref 11.5–15.5)
WBC: 5 10*3/uL (ref 4.0–10.5)

## 2013-08-15 LAB — COMPREHENSIVE METABOLIC PANEL
ALK PHOS: 64 U/L (ref 39–117)
ALT: 14 U/L (ref 0–35)
AST: 25 U/L (ref 0–37)
Albumin: 3.7 g/dL (ref 3.5–5.2)
BILIRUBIN TOTAL: 0.6 mg/dL (ref 0.2–1.2)
BUN: 18 mg/dL (ref 6–23)
CALCIUM: 9 mg/dL (ref 8.4–10.5)
CO2: 28 mEq/L (ref 19–32)
Chloride: 102 mEq/L (ref 96–112)
Creatinine, Ser: 0.9 mg/dL (ref 0.4–1.2)
GFR: 63.85 mL/min (ref >60.00)
Glucose, Bld: 125 mg/dL — ABNORMAL HIGH (ref 70–99)
Potassium: 4.1 mEq/L (ref 3.5–5.1)
Sodium: 139 mEq/L (ref 135–145)
Total Protein: 7 g/dL (ref 6.0–8.3)

## 2013-08-15 LAB — LIPID PANEL
CHOL/HDL RATIO: 5
CHOLESTEROL: 210 mg/dL — AB (ref 0–200)
HDL: 44.4 mg/dL (ref >39.00)
NONHDL: 165.6
Triglycerides: 235 mg/dL — ABNORMAL HIGH (ref 0.0–149.0)
VLDL: 47 mg/dL — AB (ref 0.0–40.0)

## 2013-08-15 LAB — LDL CHOLESTEROL, DIRECT: Direct LDL: 140.2 mg/dL

## 2013-08-17 ENCOUNTER — Ambulatory Visit: Payer: Medicare PPO | Admitting: Family Medicine

## 2013-09-07 ENCOUNTER — Other Ambulatory Visit (HOSPITAL_BASED_OUTPATIENT_CLINIC_OR_DEPARTMENT_OTHER): Payer: Medicare PPO

## 2013-09-07 ENCOUNTER — Telehealth: Payer: Self-pay | Admitting: Oncology

## 2013-09-07 ENCOUNTER — Ambulatory Visit (HOSPITAL_BASED_OUTPATIENT_CLINIC_OR_DEPARTMENT_OTHER): Payer: Medicare PPO | Admitting: Nurse Practitioner

## 2013-09-07 VITALS — BP 128/70 | HR 71 | Temp 98.5°F | Resp 18 | Ht 69.0 in | Wt 235.1 lb

## 2013-09-07 DIAGNOSIS — C19 Malignant neoplasm of rectosigmoid junction: Secondary | ICD-10-CM

## 2013-09-07 DIAGNOSIS — C189 Malignant neoplasm of colon, unspecified: Secondary | ICD-10-CM

## 2013-09-07 NOTE — Progress Notes (Signed)
  [place] OFFICE PROGRESS NOTE   Diagnosis:  Colon cancer.  INTERVAL HISTORY:   Lisa Osborne returns as scheduled. She feels well. She denies abdominal pain. No nausea or vomiting. No mouth sores. No change in bowel habits. No bloody or black stools. She has a good appetite and good energy level. She is very active. She has been swimming daily over the summer.  Objective:  Vital signs in last 24 hours:  Blood pressure 128/70, pulse 71, temperature 98.5 F (36.9 C), temperature source Oral, resp. rate 18, height _0  (1.753 m), weight 235 lb 1.6 oz (106.641 kg).    HEENT: No thrush or ulcers. Lymphatics: No palpable cervical, supraclavicular, axillary or inguinal lymph nodes. Resp: Lungs clear bilaterally. Cardio: Regular rate and rhythm. GI: Abdomen soft and nontender. No hepatomegaly. No mass. Vascular: No leg edema.   Lab Results:   CEA pending.  Imaging:  No results found.  Medications: I have reviewed the patient's current medications.  Assessment/Plan: 1. Moderately differentiated adenocarcinoma of the rectosigmoid colon, microsatellite stable, stage IIIB (T4 N1) status post low anterior resection on 04/14/2012. She completed cycle 1 adjuvant Xeloda 05/15/2012. Cycle 2 held due to to diarrhea. She completed cycle 2 at a reduced dose (1500 mg twice daily for 14 days) beginning 06/21/2012. She began cycle 3 on 07/12/2012, cycle 4 08/02/2012, cycle 5 08/30/2012, cycle 6 10/03/2012(dose reduced to 1500 mg in the morning and 1000 mg in the evening due to hand-foot syndrome). Cycle 7 10/24/2012. Cycle 8 completed beginning 11/14/2012. CTs of chest, abdomen, and pelvis 03/05/2013-negative. Colonoscopy 04/05/2013. 2 sessile polyps measuring 3 mm in size were found in the descending colon (small tubular adenoma admixed with a fragment of benign colonic mucosa. No high-grade dysplasia or invasive malignancy). Mild diverticulosis noted in the descending colon.  Colon otherwise normal. Next colonoscopy in 3 years. 2. Elevated preoperative CEA. 3. Diabetes. 4. Diarrhea coinciding with initiation of cycle 1 adjuvant Xeloda. C. difficile negative. Resolved. She did not have diarrhea with cycle 2 Xeloda. She reported nighttime diarrhea for the past 3 days when here 09/20/2012. The diarrhea has resolved. 5. Nausea with intermittent vomiting. Resolved. 6. Hypokalemia on labs 06/12/2012. Resolved. 7. Hand/foot syndrome secondary to Xeloda. Resolved 8. History of anemia.   Disposition: Lisa Osborne remains in clinical remission from colon cancer. We will followup on the CEA from today. She will return for an office visit in 6 months with labs and CT scans 1 week prior. She will contact the office in the interim with any problems.    Ned Card ANP/GNP-BC   09/07/2013  1:30 PM

## 2013-09-07 NOTE — Telephone Encounter (Signed)
gv pt appt schedule for feb 2016. central will call re ct pt aware.

## 2013-09-08 LAB — CEA: CEA: 1.2 ng/mL (ref 0.0–5.0)

## 2013-09-10 ENCOUNTER — Telehealth: Payer: Self-pay | Admitting: *Deleted

## 2013-09-10 NOTE — Telephone Encounter (Signed)
Message copied by Brien Few on Mon Sep 10, 2013  9:16 AM ------      Message from: Ladell Pier      Created: Sat Sep 08, 2013  5:22 PM       Please call patient, cea is normal ------

## 2013-12-03 ENCOUNTER — Encounter: Payer: Self-pay | Admitting: Family Medicine

## 2013-12-03 ENCOUNTER — Ambulatory Visit: Payer: Self-pay | Admitting: Family Medicine

## 2013-12-03 LAB — HM MAMMOGRAPHY: HM MAMMO: NORMAL

## 2013-12-04 ENCOUNTER — Encounter: Payer: Self-pay | Admitting: *Deleted

## 2013-12-06 ENCOUNTER — Encounter: Payer: Self-pay | Admitting: Family Medicine

## 2013-12-06 ENCOUNTER — Ambulatory Visit (INDEPENDENT_AMBULATORY_CARE_PROVIDER_SITE_OTHER): Payer: Medicare PPO | Admitting: Family Medicine

## 2013-12-06 VITALS — BP 138/80 | HR 70 | Temp 98.8°F | Wt 237.5 lb

## 2013-12-06 DIAGNOSIS — J019 Acute sinusitis, unspecified: Secondary | ICD-10-CM | POA: Insufficient documentation

## 2013-12-06 DIAGNOSIS — J209 Acute bronchitis, unspecified: Secondary | ICD-10-CM

## 2013-12-06 MED ORDER — GUAIFENESIN-CODEINE 100-10 MG/5ML PO SYRP: 5.0000 mL | ORAL_SOLUTION | Freq: Two times a day (BID) | ORAL | Status: DC | PRN

## 2013-12-06 MED ORDER — AZITHROMYCIN 250 MG PO TABS: ORAL_TABLET | ORAL | Status: DC

## 2013-12-06 NOTE — Progress Notes (Signed)
Pre visit review using our clinic review tool, if applicable. No additional management support is needed unless otherwise documented below in the visit note. 

## 2013-12-06 NOTE — Patient Instructions (Signed)
I think you have more of a viral bronchitis. Treat with codeine cough syrup, plain mucinex over the counter ,and plenty of fluids and rest. Drink a glass of water with mucinex to help mobilize the mucous. If fever >101, worsening productive cough despite above, or not improving may fill antibiotic prescribed today.

## 2013-12-06 NOTE — Progress Notes (Signed)
BP 138/80 mmHg  Pulse 70  Temp(Src) 98.8 F (37.1 C) (Oral)  Wt 237 lb 8 oz (107.729 kg)  SpO2 96%   CC: congestion with cough  Subjective:    Patient ID: Lisa Osborne, female    DOB: 05/16/1940, 73 y.o.   MRN: [id number]  HPI: Lisa Osborne is a 73 y.o. female presenting on 12/06/2013 for Cough   2 wk h/o cold caught from grandson. Cough productive of phlegm, head > chest congestion. Persistently blowing nose. Initially with ST and chills but no longer. Feeling wheezy but no dyspnea.  No fever, ear or tooth pain, headaches.   So far has tried robitussin OTC along with theraflu and alka seltzer for flu with temporary relief.  No h/o asthma No smokers at home.  H/o colon cancer s/p laparoscopic LAR 2014. Rpt colonoscopy 1 yr later with polyps, rec rpt 3 yrs [name]).  Relevant past medical, surgical, family and social history reviewed and updated as indicated.  Allergies and medications reviewed and updated. Current Outpatient Prescriptions on File Prior to Visit  Medication Sig  . Calcium Citrate-Vitamin D (CALCIUM CITRATE + D PO) Take by mouth daily.  . Coenzyme Q10 (COQ10) 200 MG CAPS Take by mouth.  . lansoprazole (PREVACID) 15 MG capsule Take 1 capsule (15 mg total) by mouth daily.  Marland Kitchen levothyroxine (SYNTHROID, LEVOTHROID) 25 MCG tablet TAKE ONE TABLET BY MOUTH DAILY  . loratadine (CLARITIN) 10 MG tablet Take 10 mg by mouth daily as needed for allergies.  . Omega-3 Fatty Acids (FISH OIL) 1000 MG CAPS Take 1 capsule by mouth daily.  . polycarbophil (FIBERCON) 625 MG tablet Take 625 mg by mouth daily. Not sure of dose  . sertraline (ZOLOFT) 50 MG tablet Take 1 tablet (50 mg total) by mouth daily.  . vitamin B-12 (CYANOCOBALAMIN) 1000 MCG tablet Take 1,000 mcg by mouth daily.   No current facility-administered medications on file prior to visit.   Past Medical History  Diagnosis Date  . History of iron deficiency anemia   . Arthritis     left side  lower back pain  . History of chicken pox   . Depression     severe in past  . Diabetes mellitus type 2, controlled 2006    diet controlled  . GERD (gastroesophageal reflux disease)     bad if off PPI  . Seasonal allergies   . Hypothyroidism     mild  . Urine incontinence     mild, wears pad  . Unilateral deafnesses     left ear deaf, wears hearing aid right side  . Rosacea   . Anemia   . Colon adenocarcinoma - rectosigmoid T3N1 (1/15) s/p lap LAR 04/14/2012    Status post laparoscopic lower anterior resection    Review of Systems Per HPI unless specifically indicated above    Objective:    BP 138/80 mmHg  Pulse 70  Temp(Src) 98.8 F (37.1 C) (Oral)  Wt 237 lb 8 oz (107.729 kg)  SpO2 96%  Physical Exam  Constitutional: She appears well-developed and well-nourished. No distress.  HENT:  Head: Normocephalic and atraumatic.  Right Ear: Hearing, tympanic membrane, external ear and ear canal normal.  Left Ear: Hearing, tympanic membrane, external ear and ear canal normal.  Nose: Mucosal edema (and pallor) present. No rhinorrhea. Right sinus exhibits no maxillary sinus tenderness and no frontal sinus tenderness. Left sinus exhibits no maxillary sinus tenderness and no frontal sinus tenderness.  Mouth/Throat: Uvula is midline,  oropharynx is clear and moist and mucous membranes are normal. No oropharyngeal exudate, posterior oropharyngeal edema, posterior oropharyngeal erythema or tonsillar abscesses.  Eyes: Conjunctivae and EOM are normal. Pupils are equal, round, and reactive to light. No scleral icterus.  Neck: Normal range of motion. Neck supple.  Cardiovascular: Normal rate, regular rhythm, normal heart sounds and intact distal pulses.   No murmur heard. Pulmonary/Chest: Effort normal and breath sounds normal. No respiratory distress. She has no wheezes. She has no rales.  Lymphadenopathy:    She has no cervical adenopathy.  Skin: Skin is warm and dry. No rash noted.    Nursing note and vitals reviewed.  Results for orders placed or performed in visit on 12/04/13  HM MAMMOGRAPHY  Result Value Ref Range   HM Mammogram Normal Birads 1-repeat 1 year       Assessment & Plan:   Problem List Items Addressed This Visit    Acute bronchitis - Primary    Anticipate viral. Discussed anticipated etiology and course of resolution. Treat cough with codeine cough syrup (discussed sedation precautions), plain mucinex expectorant, and plenty of rest/fluids. If not better with above, provided with WASP for zpack. Update if not better with treatment. Pt agrees with plan.        Follow up plan: Return if symptoms worsen or fail to improve.

## 2013-12-06 NOTE — Assessment & Plan Note (Signed)
Anticipate viral. Discussed anticipated etiology and course of resolution. Treat cough with codeine cough syrup (discussed sedation precautions), plain mucinex expectorant, and plenty of rest/fluids. If not better with above, provided with WASP for zpack. Update if not better with treatment. Pt agrees with plan.

## 2014-03-11 ENCOUNTER — Ambulatory Visit (HOSPITAL_COMMUNITY)
Admission: RE | Admit: 2014-03-11 | Discharge: 2014-03-11 | Disposition: A | Discharge status: Home / Self Care | Payer: Medicare PPO | Source: Ambulatory Visit | Attending: Nurse Practitioner | Admitting: Nurse Practitioner

## 2014-03-11 ENCOUNTER — Other Ambulatory Visit (HOSPITAL_BASED_OUTPATIENT_CLINIC_OR_DEPARTMENT_OTHER): Payer: Medicare PPO

## 2014-03-11 DIAGNOSIS — C189 Malignant neoplasm of colon, unspecified: Secondary | ICD-10-CM | POA: Diagnosis present

## 2014-03-11 DIAGNOSIS — K760 Fatty (change of) liver, not elsewhere classified: Secondary | ICD-10-CM | POA: Insufficient documentation

## 2014-03-11 DIAGNOSIS — D649 Anemia, unspecified: Secondary | ICD-10-CM

## 2014-03-11 DIAGNOSIS — R599 Enlarged lymph nodes, unspecified: Secondary | ICD-10-CM | POA: Diagnosis not present

## 2014-03-11 LAB — CBC WITH DIFFERENTIAL/PLATELET
BASO%: 0.7 % (ref 0.0–2.0)
BASOS ABS: 0 10*3/uL (ref 0.0–0.1)
EOS%: 0.7 % (ref 0.0–7.0)
Eosinophils Absolute: 0 10*3/uL (ref 0.0–0.5)
HCT: 36.1 % (ref 34.8–46.6)
HEMOGLOBIN: 11.5 g/dL — AB (ref 11.6–15.9)
LYMPH#: 2.2 10*3/uL (ref 0.9–3.3)
LYMPH%: 37.9 % (ref 14.0–49.7)
MCH: 28.7 pg (ref 25.1–34.0)
MCHC: 31.9 g/dL (ref 31.5–36.0)
MCV: 90.3 fL (ref 79.5–101.0)
MONO#: 0.5 10*3/uL (ref 0.1–0.9)
MONO%: 9.3 % (ref 0.0–14.0)
NEUT#: 2.9 10*3/uL (ref 1.5–6.5)
NEUT%: 51.4 % (ref 38.4–76.8)
Platelets: 246 10*3/uL (ref 145–400)
RBC: 4 10*6/uL (ref 3.70–5.45)
RDW: 14.4 % (ref 11.2–14.5)
WBC: 5.7 10*3/uL (ref 3.9–10.3)

## 2014-03-11 LAB — COMPREHENSIVE METABOLIC PANEL (CC13)
ALK PHOS: 81 U/L (ref 40–150)
ALT: 9 U/L (ref 0–55)
AST: 16 U/L (ref 5–34)
Albumin: 3.9 g/dL (ref 3.5–5.0)
Anion Gap: 9 mEq/L (ref 3–11)
BILIRUBIN TOTAL: 0.28 mg/dL (ref 0.20–1.20)
BUN: 19.1 mg/dL (ref 7.0–26.0)
CO2: 23 meq/L (ref 22–29)
CREATININE: 1 mg/dL (ref 0.6–1.1)
Calcium: 8.7 mg/dL (ref 8.4–10.4)
Chloride: 108 mEq/L (ref 98–109)
EGFR: 55 mL/min/{1.73_m2} — AB (ref >90)
Glucose: 122 mg/dl (ref 70–140)
Potassium: 3.9 mEq/L (ref 3.5–5.1)
Sodium: 140 mEq/L (ref 136–145)
TOTAL PROTEIN: 7.3 g/dL (ref 6.4–8.3)

## 2014-03-11 LAB — CEA: CEA: 1.4 ng/mL (ref 0.0–5.0)

## 2014-03-11 MED ORDER — IOHEXOL 300 MG/ML  SOLN
100.0000 mL | Freq: Once | INTRAMUSCULAR | Status: AC | PRN
  Administered 2014-03-11: 100 mL via INTRAVENOUS

## 2014-03-13 ENCOUNTER — Telehealth: Payer: Self-pay | Admitting: *Deleted

## 2014-03-13 NOTE — Telephone Encounter (Signed)
-----   Message from Ladell Pier, MD sent at 03/12/2014  4:39 PM EST ----- Please call patient, ct is negative for metastatic disease

## 2014-03-13 NOTE — Telephone Encounter (Signed)
Left voice message for pt to call office for CT scan results.

## 2014-03-14 ENCOUNTER — Telehealth: Payer: Self-pay | Admitting: *Deleted

## 2014-03-14 NOTE — Telephone Encounter (Signed)
Per Dr. Benay Spice; left voice message for pt to call office for CT scan results.

## 2014-03-14 NOTE — Telephone Encounter (Signed)
Pt returned call re: CT results.  Per Dr. Benay Spice notified pt that CT is negative for metastatic disease.  Pt verbalized understanding and confirmed appt for Monday 03/18/14.

## 2014-03-18 ENCOUNTER — Telehealth: Payer: Self-pay | Admitting: Oncology

## 2014-03-18 ENCOUNTER — Ambulatory Visit (HOSPITAL_BASED_OUTPATIENT_CLINIC_OR_DEPARTMENT_OTHER): Payer: Medicare PPO | Admitting: Oncology

## 2014-03-18 VITALS — BP 151/72 | HR 96 | Temp 98.0°F | Resp 18 | Ht 69.0 in | Wt 242.1 lb

## 2014-03-18 DIAGNOSIS — D649 Anemia, unspecified: Secondary | ICD-10-CM

## 2014-03-18 DIAGNOSIS — E119 Type 2 diabetes mellitus without complications: Secondary | ICD-10-CM

## 2014-03-18 DIAGNOSIS — Z85038 Personal history of other malignant neoplasm of large intestine: Secondary | ICD-10-CM

## 2014-03-18 DIAGNOSIS — C189 Malignant neoplasm of colon, unspecified: Secondary | ICD-10-CM

## 2014-03-18 NOTE — Telephone Encounter (Signed)
Pt confirmed labs/ov per 02/22 POF, gave pt AVS... KJ  °

## 2014-03-18 NOTE — Progress Notes (Signed)
  [place] OFFICE PROGRESS NOTE   Diagnosis: Colon cancer  INTERVAL HISTORY:   She returns as scheduled. She feels well. She has intermittent "reflux "symptoms that she tries to control with changing her diet. No difficulty with bowel function. Exertional dyspnea when climbing stairs. Chronic positional vertigo.  Objective:  Vital signs in last 24 hours:  Blood pressure 151/72, pulse 96, temperature 98 F (36.7 C), temperature source Oral, resp. rate 18, height $RemoveBe'5\' 9"'wdSAojKMW$  (1.753 m), weight 242 lb 1.6 oz (109.816 kg), SpO2 96 %.    HEENT: Neck without mass Lymphatics: No cervical, supra-clavicular, axillary, or inguinal nodes Resp: Lungs clear bilaterally Cardio: Regular rate and rhythm GI: No hepatomegaly, nontender, no mass Vascular: No leg edema   Lab Results:  Lab Results  Component Value Date   WBC 5.7 03/11/2014   HGB 11.5* 03/11/2014   HCT 36.1 03/11/2014   MCV 90.3 03/11/2014   PLT 246 03/11/2014   NEUTROABS 2.9 03/11/2014     Lab Results  Component Value Date   CEA 1.4 03/11/2014    Imaging: CTs of the chest, abdomen, and pelvis on 03/11/2014-no evidence of recurrent disease  Medications: I have reviewed the patient's current medications.  Assessment/Plan: 1. Moderately differentiated adenocarcinoma of the rectosigmoid colon, microsatellite stable, stage IIIB (T4 N1) status post low anterior resection on 04/14/2012. She completed cycle 1 adjuvant Xeloda 05/15/2012. Cycle 2 held due to to diarrhea. She completed cycle 2 at a reduced dose (1500 mg twice daily for 14 days) beginning 06/21/2012. She began cycle 3 on 07/12/2012, cycle 4 08/02/2012, cycle 5 08/30/2012, cycle 6 10/03/2012(dose reduced to 1500 mg in the morning and 1000 mg in the evening due to hand-foot syndrome). Cycle 7 10/24/2012. Cycle 8 completed beginning 11/14/2012.  CTs of chest, abdomen, and pelvis 03/05/2013-negative.  Colonoscopy 04/05/2013. 2 sessile polyps measuring 3 mm  in size were found in the descending colon (small tubular adenoma admixed with a fragment of benign colonic mucosa. No high-grade dysplasia or invasive malignancy). Mild diverticulosis noted in the descending colon. Colon otherwise normal. Next colonoscopy in 3 years.  CTs 03/11/2014-negative 2. Elevated preoperative CEA. 3. Diabetes. 4. History of anemia-stable low normal hemoglobin   Disposition:  She remains in clinical remission from colon cancer. She will follow-up with her primary physician to evaluate the exertional dyspnea. Ms. Iezzi will return for an office visit and CEA in 6 months.  Betsy Coder, MD  03/18/2014  11:11 AM

## 2014-05-10 ENCOUNTER — Other Ambulatory Visit: Payer: Self-pay | Admitting: Family Medicine

## 2014-05-29 ENCOUNTER — Encounter: Payer: Self-pay | Admitting: Family Medicine

## 2014-05-29 ENCOUNTER — Other Ambulatory Visit: Payer: Self-pay | Admitting: Family Medicine

## 2014-05-29 ENCOUNTER — Ambulatory Visit (INDEPENDENT_AMBULATORY_CARE_PROVIDER_SITE_OTHER): Payer: Medicare PPO | Admitting: Family Medicine

## 2014-05-29 VITALS — BP 126/84 | HR 68 | Temp 98.0°F | Wt 236.8 lb

## 2014-05-29 DIAGNOSIS — J209 Acute bronchitis, unspecified: Secondary | ICD-10-CM

## 2014-05-29 MED ORDER — AZITHROMYCIN 250 MG PO TABS: ORAL_TABLET | ORAL | Status: DC

## 2014-05-29 MED ORDER — GUAIFENESIN-CODEINE 100-10 MG/5ML PO SYRP: 5.0000 mL | ORAL_SOLUTION | Freq: Every evening | ORAL | Status: DC | PRN

## 2014-05-29 NOTE — Assessment & Plan Note (Signed)
Productive cough with fits and chills. Cover for atypicals with zpack. cheratussin for cough. Update if not improving with treatment for further evaluation.

## 2014-05-29 NOTE — Progress Notes (Signed)
BP 126/84 mmHg  Pulse 68  Temp(Src) 98 F (36.7 C) (Oral)  Wt 236 lb 12 oz (107.389 kg)  SpO2 94%   CC: worsening cough  Subjective:    Patient ID: Lisa Osborne, female    DOB: 1940-12-23, 74 y.o.   MRN: [id number]  HPI: Lisa Osborne is a 74 y.o. female presenting on 05/29/2014 for Cough   "my bronchitis is coming back"  Started with allergy flare over the past week. Very fatigued, worsening productive cough of green phlegm, trouble sleeping 2/2 cough. Progressively worsening over last 3 days. + chills, PNDrainage and ST. Predominant chest congestion. Coughing fits with deep hoarse cough.  No chills, ear or tooth pain, headaches.  So far has tried cheratussin cough syrup which helps.  No sick contacts at home. No smokers at home. No h/o asthma.  Relevant past medical, surgical, family and social history reviewed and updated as indicated. Interim medical history since our last visit reviewed. Allergies and medications reviewed and updated. Current Outpatient Prescriptions on File Prior to Visit  Medication Sig  . Calcium Citrate-Vitamin D (CALCIUM CITRATE + D PO) Take by mouth daily.  . Coenzyme Q10 (COQ10) 200 MG CAPS Take by mouth.  . lansoprazole (PREVACID) 15 MG capsule Take one tablet daily **MUST HAVE PHYSICAL FOR FURTHER REFILLS**  . levothyroxine (SYNTHROID, LEVOTHROID) 25 MCG tablet TAKE ONE TABLET BY MOUTH DAILY  . loratadine (CLARITIN) 10 MG tablet Take 10 mg by mouth daily as needed for allergies.  . Omega-3 Fatty Acids (FISH OIL) 1000 MG CAPS Take 1 capsule by mouth daily.  . polycarbophil (FIBERCON) 625 MG tablet Take 625 mg by mouth daily. Not sure of dose  . sertraline (ZOLOFT) 50 MG tablet Take one tablet daily **MUST HAVE PHYSICAL FOR FURTHER REFILLS**  . vitamin B-12 (CYANOCOBALAMIN) 1000 MCG tablet Take 1,000 mcg by mouth daily.   No current facility-administered medications on file prior to visit.    Review of Systems Per HPI unless  specifically indicated above     Objective:    BP 126/84 mmHg  Pulse 68  Temp(Src) 98 F (36.7 C) (Oral)  Wt 236 lb 12 oz (107.389 kg)  SpO2 94%  Wt Readings from Last 3 Encounters:  05/29/14 236 lb 12 oz (107.389 kg)  03/18/14 242 lb 1.6 oz (109.816 kg)  12/06/13 237 lb 8 oz (107.729 kg)    Physical Exam  Constitutional: She appears well-developed and well-nourished. No distress.  HENT:  Head: Normocephalic and atraumatic.  Right Ear: Hearing, tympanic membrane, external ear and ear canal normal.  Left Ear: Hearing, tympanic membrane, external ear and ear canal normal.  Nose: No mucosal edema or rhinorrhea. Right sinus exhibits no maxillary sinus tenderness and no frontal sinus tenderness. Left sinus exhibits no maxillary sinus tenderness and no frontal sinus tenderness.  Mouth/Throat: Uvula is midline, oropharynx is clear and moist and mucous membranes are normal. No oropharyngeal exudate, posterior oropharyngeal edema, posterior oropharyngeal erythema or tonsillar abscesses.  Eyes: Conjunctivae and EOM are normal. Pupils are equal, round, and reactive to light. No scleral icterus.  Neck: Normal range of motion. Neck supple.  Cardiovascular: Normal rate, regular rhythm, normal heart sounds and intact distal pulses.   No murmur heard. Pulmonary/Chest: Effort normal and breath sounds normal. No respiratory distress. She has no wheezes. She has no rales.  Slightly coarse R lung field  Lymphadenopathy:    She has no cervical adenopathy.  Skin: Skin is warm and dry. No rash noted.  Nursing note and vitals reviewed.     Assessment & Plan:   Problem List Items Addressed This Visit    Acute bronchitis - Primary    Productive cough with fits and chills. Cover for atypicals with zpack. cheratussin for cough. Update if not improving with treatment for further evaluation.          Follow up plan: Return in about 4 weeks (around 06/26/2014), or if symptoms worsen or fail to  improve, for annual exam, prior fasting for blood work.

## 2014-05-29 NOTE — Progress Notes (Signed)
Pre visit review using our clinic review tool, if applicable. No additional management support is needed unless otherwise documented below in the visit note. 

## 2014-05-29 NOTE — Patient Instructions (Addendum)
I think you have another bronchitis - treat with zpack and cheratussin cough syrup. Push fluids and plenty of rest May use aleve 220mg  twice daily with meals for next few days. Let us know if fever >101, worsening productive cough ,or just not improving as expected. Return for physical

## 2014-06-07 ENCOUNTER — Other Ambulatory Visit: Payer: Self-pay | Admitting: Family Medicine

## 2014-06-07 ENCOUNTER — Other Ambulatory Visit (INDEPENDENT_AMBULATORY_CARE_PROVIDER_SITE_OTHER): Payer: Medicare PPO

## 2014-06-07 DIAGNOSIS — E039 Hypothyroidism, unspecified: Secondary | ICD-10-CM

## 2014-06-07 DIAGNOSIS — E119 Type 2 diabetes mellitus without complications: Secondary | ICD-10-CM

## 2014-06-07 LAB — BASIC METABOLIC PANEL
BUN: 14 mg/dL (ref 6–23)
CHLORIDE: 99 meq/L (ref 96–112)
CO2: 31 meq/L (ref 19–32)
CREATININE: 0.95 mg/dL (ref 0.40–1.20)
Calcium: 8.6 mg/dL (ref 8.4–10.5)
GFR: 61.39 mL/min (ref >60.00)
GLUCOSE: 129 mg/dL — AB (ref 70–99)
Potassium: 3.4 mEq/L — ABNORMAL LOW (ref 3.5–5.1)
Sodium: 137 mEq/L (ref 135–145)

## 2014-06-07 LAB — TSH: TSH: 2.14 u[IU]/mL (ref 0.35–4.50)

## 2014-06-07 LAB — LIPID PANEL
CHOLESTEROL: 179 mg/dL (ref 0–200)
HDL: 43.5 mg/dL (ref >39.00)
NonHDL: 135.5
Total CHOL/HDL Ratio: 4
Triglycerides: 216 mg/dL — ABNORMAL HIGH (ref 0.0–149.0)
VLDL: 43.2 mg/dL — AB (ref 0.0–40.0)

## 2014-06-07 LAB — MICROALBUMIN / CREATININE URINE RATIO
Creatinine,U: 204.3 mg/dL
MICROALB/CREAT RATIO: 0.8 mg/g (ref 0.0–30.0)
Microalb, Ur: 1.7 mg/dL (ref 0.0–1.9)

## 2014-06-07 LAB — LDL CHOLESTEROL, DIRECT: Direct LDL: 109 mg/dL

## 2014-06-07 LAB — HEMOGLOBIN A1C: Hgb A1c MFr Bld: 6.8 % — ABNORMAL HIGH (ref 4.6–6.5)

## 2014-06-11 ENCOUNTER — Encounter: Payer: Self-pay | Admitting: Family Medicine

## 2014-06-11 ENCOUNTER — Ambulatory Visit (INDEPENDENT_AMBULATORY_CARE_PROVIDER_SITE_OTHER): Payer: Medicare PPO | Admitting: Family Medicine

## 2014-06-11 VITALS — BP 130/80 | HR 89 | Temp 98.0°F | Ht 69.0 in | Wt 234.0 lb

## 2014-06-11 DIAGNOSIS — Z0001 Encounter for general adult medical examination with abnormal findings: Secondary | ICD-10-CM | POA: Insufficient documentation

## 2014-06-11 DIAGNOSIS — E039 Hypothyroidism, unspecified: Secondary | ICD-10-CM | POA: Diagnosis not present

## 2014-06-11 DIAGNOSIS — E119 Type 2 diabetes mellitus without complications: Secondary | ICD-10-CM

## 2014-06-11 DIAGNOSIS — F32A Depression, unspecified: Secondary | ICD-10-CM

## 2014-06-11 DIAGNOSIS — J019 Acute sinusitis, unspecified: Secondary | ICD-10-CM

## 2014-06-11 DIAGNOSIS — Z0000 Encounter for general adult medical examination without abnormal findings: Secondary | ICD-10-CM

## 2014-06-11 DIAGNOSIS — C189 Malignant neoplasm of colon, unspecified: Secondary | ICD-10-CM | POA: Diagnosis not present

## 2014-06-11 DIAGNOSIS — F329 Major depressive disorder, single episode, unspecified: Secondary | ICD-10-CM | POA: Diagnosis not present

## 2014-06-11 MED ORDER — AMOXICILLIN-POT CLAVULANATE 875-125 MG PO TABS
1.0000 | ORAL_TABLET | Freq: Two times a day (BID) | ORAL | Status: AC
Start: 2014-06-11 — End: 2014-06-21

## 2014-06-11 MED ORDER — SERTRALINE HCL 100 MG PO TABS: 100.0000 mg | ORAL_TABLET | Freq: Every day | ORAL | Status: DC

## 2014-06-11 MED ORDER — LANSOPRAZOLE 15 MG PO CPDR: DELAYED_RELEASE_CAPSULE | ORAL | Status: DC

## 2014-06-11 MED ORDER — GUAIFENESIN-CODEINE 100-10 MG/5ML PO SYRP: 5.0000 mL | ORAL_SOLUTION | Freq: Every evening | ORAL | Status: DC | PRN

## 2014-06-11 NOTE — Progress Notes (Signed)
Pre visit review using our clinic review tool, if applicable. No additional management support is needed unless otherwise documented below in the visit note. 

## 2014-06-11 NOTE — Assessment & Plan Note (Signed)
Diet controlled.  

## 2014-06-11 NOTE — Assessment & Plan Note (Signed)
Will try higher sertraline 100mg  sent to pharmacy.

## 2014-06-11 NOTE — Patient Instructions (Addendum)
Schedule eye exam at your convenience. Update advanced directive and bring [place] a copy Increase potassium rich foods for now. Try higher dose of zoloft 100mg  sent to pharmacy. augmentin 10d course for sinus infection, and I have refilled cheratussin cough syrup. Please call me if not improved after treatment.

## 2014-06-11 NOTE — Assessment & Plan Note (Addendum)
Appreciate onc and GI and surgery care of patient. Stable without evidence of recurrence

## 2014-06-11 NOTE — Assessment & Plan Note (Signed)

## 2014-06-11 NOTE — Assessment & Plan Note (Signed)
TSH stable.  

## 2014-06-11 NOTE — Progress Notes (Signed)
BP 130/80 mmHg  Pulse 89  Temp(Src) 98 F (36.7 C)  Ht 5\' 9"  (1.753 m)  Wt 234 lb (106.142 kg)  BMI 34.54 kg/m2  SpO2 97%   CC: medicare wellness visit  Subjective:    Patient ID: Lisa Osborne, female    DOB: [birth year], 74 y.o.   MRN: [id number]  HPI: Lisa Osborne is a 74 y.o. female presenting on 06/11/2014 for Annual Exam   Seen here 05/29/2014 with bronchitis. Completed zpack and cheratussin cough syrup, no improvement. Continued head congestion/pressure, cough productive of thick mucous.   Persistent knee pain from arthritis, L>R.   Hearing screen (known hearing loss on left), hearing aide on right Vision screen passed. 1 fall in past year - knees gave out while at grandson's birthday party.  Denies depression/anhedonia/sadness  Preventative: Colon cancer s/p lap LAR 03/2012. COLONOSCOPY Date: 03/2013 2 polyps, mild diverticulosis, rpt 3 yrs [name]) Well woman - s/p total hysterectomy 1985.  Mammogram 11/2013. Normal  DEXA - done in [place] and normal per patient.  Flu - 12/2013 Tetanus - 2010 Pneumovax 2008, prevnar 2015 zostavax - considering Advanced directive: has at home, out of date. Needs updating. HCPOA would be daughter Juliene Pina.   Caffeine: 1 cup coffee/day Lives alone. 1 daughter [name]), 3 stillborns Occupation: retired, was Chief Technology Officer. Activity: no regular activity but does park far away, has treadmill at home Diet: good fruits/vegetables, more fruit/vegetable smoothies good amt water  Relevant past medical, surgical, family and social history reviewed and updated as indicated. Interim medical history since our last visit reviewed. Allergies and medications reviewed and updated. Current Outpatient Prescriptions on File Prior to Visit  Medication Sig  . Calcium Citrate-Vitamin D (CALCIUM CITRATE + D PO) Take by mouth daily.  . Coenzyme Q10 (COQ10) 200 MG CAPS Take by mouth.  . levothyroxine (SYNTHROID, LEVOTHROID) 25 MCG  tablet TAKE ONE TABLET BY MOUTH DAILY  . loratadine (CLARITIN) 10 MG tablet Take 10 mg by mouth daily as needed for allergies.  . Omega-3 Fatty Acids (FISH OIL) 1000 MG CAPS Take 1 capsule by mouth daily.  . polycarbophil (FIBERCON) 625 MG tablet Take 625 mg by mouth daily. Not sure of dose  . vitamin B-12 (CYANOCOBALAMIN) 1000 MCG tablet Take 1,000 mcg by mouth daily.   No current facility-administered medications on file prior to visit.    Review of Systems  Constitutional: Positive for chills (recent bronchitis). Negative for fever, activity change, appetite change, fatigue and unexpected weight change.  HENT: Negative for hearing loss.   Eyes: Positive for visual disturbance (L eye).  Respiratory: Positive for cough (recent bronchitis), shortness of breath (with exertion) and wheezing. Negative for chest tightness.   Cardiovascular: Negative for chest pain, palpitations and leg swelling.  Gastrointestinal: Negative for nausea, vomiting, abdominal pain, diarrhea, constipation, blood in stool and abdominal distention.  Genitourinary: Negative for hematuria and difficulty urinating.  Musculoskeletal: Negative for myalgias, arthralgias and neck pain.  Skin: Negative for rash.  Neurological: Positive for dizziness. Negative for seizures, syncope and headaches.  Hematological: Negative for adenopathy. Does not bruise/bleed easily.  Psychiatric/Behavioral: Positive for dysphoric mood (on zoloft). The patient is not nervous/anxious.    Per HPI unless specifically indicated above     Objective:    BP 130/80 mmHg  Pulse 89  Temp(Src) 98 F (36.7 C)  Ht 5\' 9"  (1.753 m)  Wt 234 lb (106.142 kg)  BMI 34.54 kg/m2  SpO2 97%  Wt Readings from Last 3 Encounters:  06/11/14 234 lb (106.142 kg)  05/29/14 236 lb 12 oz (107.389 kg)  03/18/14 242 lb 1.6 oz (109.816 kg)    Physical Exam  Constitutional: She is oriented to person, place, and time. She appears well-developed and well-nourished. No  distress.  HENT:  Head: Normocephalic and atraumatic.  Right Ear: Hearing, tympanic membrane, external ear and ear canal normal.  Left Ear: Hearing, tympanic membrane, external ear and ear canal normal.  Nose: Mucosal edema (pallor) present. No rhinorrhea. Right sinus exhibits no maxillary sinus tenderness and no frontal sinus tenderness. Left sinus exhibits no maxillary sinus tenderness and no frontal sinus tenderness.  Mouth/Throat: Uvula is midline, oropharynx is clear and moist and mucous membranes are normal. No oropharyngeal exudate, posterior oropharyngeal edema, posterior oropharyngeal erythema or tonsillar abscesses.  Eyes: Conjunctivae and EOM are normal. Pupils are equal, round, and reactive to light. No scleral icterus.  Neck: Normal range of motion. Neck supple. Carotid bruit is not present. No thyromegaly present.  Cardiovascular: Normal rate, regular rhythm, normal heart sounds and intact distal pulses.   No murmur heard. Pulses:      Radial pulses are 2+ on the right side, and 2+ on the left side.  Pulmonary/Chest: Effort normal and breath sounds normal. No respiratory distress. She has no wheezes. She has no rales. Right breast exhibits no inverted nipple, no mass, no nipple discharge, no skin change and no tenderness. Left breast exhibits no inverted nipple, no mass, no nipple discharge, no skin change and no tenderness.  Lungs clear  Abdominal: Soft. Bowel sounds are normal. She exhibits no distension and no mass. There is no tenderness. There is no rebound and no guarding.  Genitourinary:  GYN - deferred. S/p hysterectomy  Musculoskeletal: Normal range of motion. She exhibits no edema.  Lymphadenopathy:       Head (right side): No submental, no submandibular, no tonsillar, no preauricular and no posterior auricular adenopathy present.       Head (left side): No submental, no submandibular, no tonsillar, no preauricular and no posterior auricular adenopathy present.    She  has no cervical adenopathy.    She has no axillary adenopathy.       Right axillary: No lateral adenopathy present.       Left axillary: No lateral adenopathy present.      Right: No supraclavicular adenopathy present.       Left: No supraclavicular adenopathy present.  Neurological: She is alert and oriented to person, place, and time.  CN grossly intact, station and gait intact  Skin: Skin is warm and dry. No rash noted.  Psychiatric: She has a normal mood and affect. Her behavior is normal. Judgment and thought content normal.  Nursing note and vitals reviewed.  Results for orders placed or performed in visit on 06/11/14  HM DEXA SCAN  Result Value Ref Range   HM Dexa Scan normal per patient       Assessment & Plan:   Problem List Items Addressed This Visit    Acute sinusitis    Persistent trouble with head congestion and cough despite finishing cheratussin course and zpack. Anticipate has developed acute bacterial sinusitis - will treat with augmentin course, refilled cheratussin. Update if sxs persist or fail to improve for further evaluation - would start with CXR.      Relevant Medications   amoxicillin-clavulanate (AUGMENTIN) 875-125 MG per tablet   guaiFENesin-codeine (ROBITUSSIN AC) 100-10 MG/5ML syrup   Colon adenocarcinoma - rectosigmoid T3N1 (1/15) s/p lap LAR 04/14/2012  Appreciate onc and GI and surgery care of patient. Stable without evidence of recurrence      Depression    Will try higher sertraline 100mg  sent to pharmacy.      Relevant Medications   sertraline (ZOLOFT) 100 MG tablet   Diabetes mellitus type 2, controlled    Diet controlled.       Health maintenance examination    Preventative protocols reviewed and updated unless pt declined. Discussed healthy diet and lifestyle.       Hypothyroidism    TSH stable.      Medicare annual wellness visit, subsequent - Primary    I have personally reviewed the Medicare Annual Wellness questionnaire  and have noted 1. The patient's medical and social history 2. Their use of alcohol, tobacco or illicit drugs 3. Their current medications and supplements 4. The patient's functional ability including ADL's, fall risks, home safety risks and hearing or visual impairment. 5. Diet and physical activity 6. Evidence for depression or mood disorders The patients weight, height, BMI have been recorded in the chart.  Hearing and vision has been addressed. I have made referrals, counseling and provided education to the patient based review of the above and I have provided the pt with a written personalized care plan for preventive services. Provider list updated - see scanned questionairre. Reviewed preventative protocols and updated unless pt declined.           Follow up plan: Return in about 6 months (around 12/12/2014), or as needed, for follow up visit.

## 2014-06-11 NOTE — Assessment & Plan Note (Signed)
Persistent trouble with head congestion and cough despite finishing cheratussin course and zpack. Anticipate has developed acute bacterial sinusitis - will treat with augmentin course, refilled cheratussin. Update if sxs persist or fail to improve for further evaluation - would start with CXR.

## 2014-06-11 NOTE — Assessment & Plan Note (Signed)
Preventative protocols reviewed and updated unless pt declined. Discussed healthy diet and lifestyle.  

## 2014-06-14 ENCOUNTER — Other Ambulatory Visit: Payer: Self-pay | Admitting: Family Medicine

## 2014-07-03 DIAGNOSIS — H2513 Age-related nuclear cataract, bilateral: Secondary | ICD-10-CM | POA: Diagnosis not present

## 2014-07-03 DIAGNOSIS — H524 Presbyopia: Secondary | ICD-10-CM | POA: Diagnosis not present

## 2014-07-03 DIAGNOSIS — H40013 Open angle with borderline findings, low risk, bilateral: Secondary | ICD-10-CM | POA: Diagnosis not present

## 2014-07-03 DIAGNOSIS — E119 Type 2 diabetes mellitus without complications: Secondary | ICD-10-CM | POA: Diagnosis not present

## 2014-07-03 LAB — HM DIABETES EYE EXAM

## 2014-07-09 ENCOUNTER — Encounter: Payer: Self-pay | Admitting: Family Medicine

## 2014-08-05 ENCOUNTER — Other Ambulatory Visit: Payer: Self-pay | Admitting: Family Medicine

## 2014-09-09 ENCOUNTER — Telehealth: Payer: Self-pay | Admitting: Oncology

## 2014-09-09 ENCOUNTER — Other Ambulatory Visit (HOSPITAL_BASED_OUTPATIENT_CLINIC_OR_DEPARTMENT_OTHER): Payer: Medicare PPO

## 2014-09-09 ENCOUNTER — Ambulatory Visit (HOSPITAL_BASED_OUTPATIENT_CLINIC_OR_DEPARTMENT_OTHER): Payer: Medicare PPO | Admitting: Oncology

## 2014-09-09 VITALS — BP 142/73 | HR 105 | Temp 98.2°F | Resp 18 | Ht 69.0 in | Wt 240.5 lb

## 2014-09-09 DIAGNOSIS — Z85038 Personal history of other malignant neoplasm of large intestine: Secondary | ICD-10-CM | POA: Diagnosis not present

## 2014-09-09 DIAGNOSIS — E119 Type 2 diabetes mellitus without complications: Secondary | ICD-10-CM

## 2014-09-09 DIAGNOSIS — C189 Malignant neoplasm of colon, unspecified: Secondary | ICD-10-CM | POA: Diagnosis not present

## 2014-09-09 DIAGNOSIS — R970 Elevated carcinoembryonic antigen [CEA]: Secondary | ICD-10-CM | POA: Diagnosis not present

## 2014-09-09 DIAGNOSIS — D649 Anemia, unspecified: Secondary | ICD-10-CM

## 2014-09-09 DIAGNOSIS — R002 Palpitations: Secondary | ICD-10-CM | POA: Diagnosis not present

## 2014-09-09 LAB — CBC WITH DIFFERENTIAL/PLATELET
BASO%: 0.2 % (ref 0.0–2.0)
BASOS ABS: 0 10*3/uL (ref 0.0–0.1)
EOS ABS: 0 10*3/uL (ref 0.0–0.5)
EOS%: 0.3 % (ref 0.0–7.0)
HEMATOCRIT: 32.1 % — AB (ref 34.8–46.6)
HGB: 10 g/dL — ABNORMAL LOW (ref 11.6–15.9)
LYMPH#: 1.8 10*3/uL (ref 0.9–3.3)
LYMPH%: 27.4 % (ref 14.0–49.7)
MCH: 26 pg (ref 25.1–34.0)
MCHC: 31.2 g/dL — AB (ref 31.5–36.0)
MCV: 83.6 fL (ref 79.5–101.0)
MONO#: 0.6 10*3/uL (ref 0.1–0.9)
MONO%: 8.8 % (ref 0.0–14.0)
NEUT#: 4.2 10*3/uL (ref 1.5–6.5)
NEUT%: 63.3 % (ref 38.4–76.8)
PLATELETS: 320 10*3/uL (ref 145–400)
RBC: 3.84 10*6/uL (ref 3.70–5.45)
RDW: 16.3 % — ABNORMAL HIGH (ref 11.2–14.5)
WBC: 6.6 10*3/uL (ref 3.9–10.3)

## 2014-09-09 LAB — CEA: CEA: 0.8 ng/mL (ref 0.0–5.0)

## 2014-09-09 NOTE — Progress Notes (Signed)
  [place] [facility] OFFICE PROGRESS NOTE   Diagnosis: Colon cancer  INTERVAL HISTORY:   She returns as scheduled. She feels well. She has noted increased somnolence for the past several days after an increase in the Zoloft dose. Good appetite and energy level. No bleeding. She reports intermittent palpitations over the past months. The palpitations last for minutes and resolved.   Objective:  Vital signs in last 24 hours:  Blood pressure 142/73, pulse 105, temperature 98.2 F (36.8 C), temperature source Oral, resp. rate 18, height $RemoveBe'5\' 9"'DrcRKcksM$  (1.753 m), weight 240 lb 8 oz (109.09 kg), SpO2 98 %.    HEENT: Neck without mass Lymphatics: No cervical, supraclavicular, axillary, or inguinal nodes Resp: Lungs clear bilaterally Cardio: Irregular GI: No hepatosplenomegaly, nontender, no mass Vascular: No leg edema   Lab Results:  Lab Results  Component Value Date   WBC 6.6 09/09/2014   HGB 10.0* 09/09/2014   HCT 32.1* 09/09/2014   MCV 83.6 09/09/2014   PLT 320 09/09/2014   NEUTROABS 4.2 09/09/2014     Lab Results  Component Value Date   CEA 1.4 03/11/2014     EKG-sinus arrhythmia, incomplete right bundle-branch block Medications: I have reviewed the patient's current medications.  Assessment/Plan: 1. Moderately differentiated adenocarcinoma of the rectosigmoid colon, microsatellite stable, stage IIIB (T4 N1) status post low anterior resection on 04/14/2012. She completed cycle 1 adjuvant Xeloda 05/15/2012. Cycle 2 held due to to diarrhea. She completed cycle 2 at a reduced dose (1500 mg twice daily for 14 days) beginning 06/21/2012. She began cycle 3 on 07/12/2012, cycle 4 08/02/2012, cycle 5 08/30/2012, cycle 6 10/03/2012(dose reduced to 1500 mg in the morning and 1000 mg in the evening due to hand-foot syndrome). Cycle 7 10/24/2012. Cycle 8 completed beginning 11/14/2012.  CTs of chest, abdomen, and pelvis 03/05/2013-negative.  Colonoscopy 04/05/2013. 2 sessile  polyps measuring 3 mm in size were found in the descending colon (small tubular adenoma admixed with a fragment of benign colonic mucosa. No high-grade dysplasia or invasive malignancy). Mild diverticulosis noted in the descending colon. Colon otherwise normal. Next colonoscopy in 3 years.  CTs 03/11/2014-negative 2. Elevated preoperative CEA. 3. Diabetes. 4. History of the hemoglobin is lower today 5. Palpitations-an EKG reveals a sinus arrhythmia. I recommended she follow-up with Dr. Danise Mina to evaluate the palpitations     Disposition:  Ms. Hubers remains include remission from colon cancer. She is more anemic today. She reports a chronic history of intermittent anemia. She will return for a CBC, ferritin level, and peripheral blood smear in approximately 5 weeks.  I recommended she follow-up with Dr. Danise Mina to evaluate the palpitations.  We will follow-up on the CEA from today.  Betsy Coder, MD  09/09/2014  11:15 AM

## 2014-09-09 NOTE — Telephone Encounter (Signed)
Gave avs & calendar for September °

## 2014-09-12 ENCOUNTER — Telehealth: Payer: Self-pay | Admitting: *Deleted

## 2014-09-12 NOTE — Telephone Encounter (Signed)
-----   Message from Ladell Pier, MD sent at 09/09/2014  6:30 PM EDT ----- Please call patient, Lisa Osborne is normal

## 2014-09-12 NOTE — Telephone Encounter (Signed)
Per Dr. Benay Spice; left voice message that cea is normal; call if any questions.

## 2014-09-14 ENCOUNTER — Encounter: Payer: Self-pay | Admitting: Family Medicine

## 2014-09-14 DIAGNOSIS — I4510 Unspecified right bundle-branch block: Secondary | ICD-10-CM | POA: Insufficient documentation

## 2014-09-14 DIAGNOSIS — R9431 Abnormal electrocardiogram [ECG] [EKG]: Secondary | ICD-10-CM | POA: Insufficient documentation

## 2014-10-14 ENCOUNTER — Telehealth: Payer: Self-pay | Admitting: Oncology

## 2014-10-14 ENCOUNTER — Other Ambulatory Visit (HOSPITAL_BASED_OUTPATIENT_CLINIC_OR_DEPARTMENT_OTHER): Payer: Medicare PPO

## 2014-10-14 ENCOUNTER — Ambulatory Visit (HOSPITAL_BASED_OUTPATIENT_CLINIC_OR_DEPARTMENT_OTHER): Payer: Medicare PPO | Admitting: Nurse Practitioner

## 2014-10-14 ENCOUNTER — Ambulatory Visit (HOSPITAL_BASED_OUTPATIENT_CLINIC_OR_DEPARTMENT_OTHER): Payer: Medicare PPO

## 2014-10-14 VITALS — BP 157/71 | HR 75 | Temp 98.4°F | Resp 18 | Ht 69.0 in | Wt 239.5 lb

## 2014-10-14 DIAGNOSIS — C189 Malignant neoplasm of colon, unspecified: Secondary | ICD-10-CM | POA: Diagnosis not present

## 2014-10-14 LAB — URINALYSIS, MICROSCOPIC - CHCC
BLOOD: NEGATIVE
GLUCOSE UR CHCC: NEGATIVE mg/dL
KETONES: NEGATIVE mg/dL
Leukocyte Esterase: NEGATIVE
Nitrite: NEGATIVE
PH: 6 (ref 4.6–8.0)
PROTEIN: 30 mg/dL
RBC / HPF: NEGATIVE (ref 0–2)
SPECIFIC GRAVITY, URINE: 1.025 (ref 1.003–1.035)
UROBILINOGEN UR: 0.2 mg/dL (ref 0.2–1)

## 2014-10-14 LAB — CBC WITH DIFFERENTIAL/PLATELET
BASO%: 0.2 % (ref 0.0–2.0)
BASOS ABS: 0 10*3/uL (ref 0.0–0.1)
EOS ABS: 0.1 10*3/uL (ref 0.0–0.5)
EOS%: 2.7 % (ref 0.0–7.0)
HCT: 36.7 % (ref 34.8–46.6)
HGB: 11.6 g/dL (ref 11.6–15.9)
LYMPH%: 33.7 % (ref 14.0–49.7)
MCH: 27.8 pg (ref 25.1–34.0)
MCHC: 31.6 g/dL (ref 31.5–36.0)
MCV: 88 fL (ref 79.5–101.0)
MONO#: 0.4 10*3/uL (ref 0.1–0.9)
MONO%: 8.8 % (ref 0.0–14.0)
NEUT%: 54.6 % (ref 38.4–76.8)
NEUTROS ABS: 2.7 10*3/uL (ref 1.5–6.5)
PLATELETS: 236 10*3/uL (ref 145–400)
RBC: 4.17 10*6/uL (ref 3.70–5.45)
RDW: 18.9 % — ABNORMAL HIGH (ref 11.2–14.5)
WBC: 4.9 10*3/uL (ref 3.9–10.3)
lymph#: 1.7 10*3/uL (ref 0.9–3.3)

## 2014-10-14 LAB — FERRITIN CHCC: Ferritin: 112 ng/ml (ref 9–269)

## 2014-10-14 LAB — CHCC SMEAR

## 2014-10-14 NOTE — Telephone Encounter (Signed)
Gave and printed appt sched and avs for pt for Jan 2017 °

## 2014-10-14 NOTE — Progress Notes (Signed)
  [facility] OFFICE PROGRESS NOTE   Diagnosis:  Colon cancer  INTERVAL HISTORY:   Lisa Osborne returns as scheduled. She began taking oral iron and B-12 following her last visit. She notes improvement in her energy level. No bloody or black stools. No hematuria. No other bleeding. She had some loose stools over the weekend.  Objective:  Vital signs in last 24 hours:  Blood pressure 157/71, pulse 75, temperature 98.4 F (36.9 C), temperature source Oral, resp. rate 18, height $RemoveBe'5\' 9"'OoJjBkumc$  (1.753 m), weight 239 lb 8 oz (108.636 kg).    HEENT: No thrush or ulcers. Resp: Lungs clear bilaterally. Cardio: Regular rate and rhythm. GI: Abdomen soft and nontender. No hepatomegaly. Vascular: No leg edema.   Lab Results:  Lab Results  Component Value Date   WBC 4.9 10/14/2014   HGB 11.6 10/14/2014   HCT 36.7 10/14/2014   MCV 88.0 10/14/2014   PLT 236 10/14/2014   NEUTROABS 2.7 10/14/2014    Imaging:  No results found.  Medications: I have reviewed the patient's current medications.  Assessment/Plan: 1. Moderately differentiated adenocarcinoma of the rectosigmoid colon, microsatellite stable, stage IIIB (T4 N1) status post low anterior resection on 04/14/2012. She completed cycle 1 adjuvant Xeloda 05/15/2012. Cycle 2 held due to to diarrhea. She completed cycle 2 at a reduced dose (1500 mg twice daily for 14 days) beginning 06/21/2012. She began cycle 3 on 07/12/2012, cycle 4 08/02/2012, cycle 5 08/30/2012, cycle 6 10/03/2012(dose reduced to 1500 mg in the morning and 1000 mg in the evening due to hand-foot syndrome). Cycle 7 10/24/2012. Cycle 8 completed beginning 11/14/2012.  CTs of chest, abdomen, and pelvis 03/05/2013-negative.  Colonoscopy 04/05/2013. 2 sessile polyps measuring 3 mm in size were found in the descending colon (small tubular adenoma admixed with a fragment of benign colonic mucosa. No high-grade dysplasia or invasive malignancy). Mild diverticulosis  noted in the descending colon. Colon otherwise normal. Next colonoscopy in 3 years.  CTs 03/11/2014-negative 2. Elevated preoperative CEA. 3. Diabetes. 4. History of anemia. Hemoglobin was lower on 09/09/2014. Oral iron initiated following that visit. Hemoglobin in normal range 10/14/2014. 5. Palpitations-an EKG reveals a sinus arrhythmia. It was recommended she follow-up with Dr. Danise Mina to evaluate the palpitations   Disposition: Lisa Osborne appears stable. The hemoglobin corrected into normal range with initiation of oral iron. She will complete a set of stool cards. We will refer her to GI if stool cards show occult blood. We will obtain a urinalysis today. She will return for a follow-up visit in 4 months.  Ned Card ANP/GNP-BC   10/14/2014  1:21 PM

## 2014-10-16 ENCOUNTER — Telehealth: Payer: Self-pay | Admitting: *Deleted

## 2014-10-16 ENCOUNTER — Telehealth: Payer: Self-pay | Admitting: Nurse Practitioner

## 2014-10-16 NOTE — Telephone Encounter (Signed)
Patient called and stated,"someone from this office called me." This RN read the notes and informed patient that her UA was negative for blood and that she needed to still do the stool cards and send to the office. Patient verbalized understanding.

## 2014-10-16 NOTE — Telephone Encounter (Signed)
-----   Message from Owens Shark, NP sent at 10/14/2014  4:27 PM EDT ----- Please let her know the urinalysis was negative for blood. She should complete the stool cards and send them back to the office.

## 2014-10-16 NOTE — Telephone Encounter (Signed)
Call placed to Ms. Roadcap As per Owens Shark to inform her that her urinalysis was negative for blood. Message left on answering machine to return call.

## 2014-10-29 ENCOUNTER — Other Ambulatory Visit: Payer: Self-pay | Admitting: *Deleted

## 2014-10-29 ENCOUNTER — Ambulatory Visit (HOSPITAL_BASED_OUTPATIENT_CLINIC_OR_DEPARTMENT_OTHER): Payer: Medicare PPO

## 2014-10-29 DIAGNOSIS — C189 Malignant neoplasm of colon, unspecified: Secondary | ICD-10-CM

## 2014-10-29 LAB — FECAL OCCULT BLOOD, GUAIAC: OCCULT BLOOD: NEGATIVE

## 2014-10-30 ENCOUNTER — Telehealth: Payer: Self-pay | Admitting: *Deleted

## 2014-10-30 NOTE — Telephone Encounter (Signed)
-----   Message from Ladell Pier, MD sent at 10/29/2014  1:50 PM EDT ----- Please call patient, stool hemoccult is negative, continue iron and add cbc next visit

## 2014-10-30 NOTE — Telephone Encounter (Signed)
Per Dr. Benay Spice; notified pt that hemoccult is negative, continue taking iron and will re-check labs next visit. Pt verbalized understanding and expressed appreciation for call.

## 2014-12-23 ENCOUNTER — Ambulatory Visit (INDEPENDENT_AMBULATORY_CARE_PROVIDER_SITE_OTHER): Payer: Medicare PPO | Admitting: Family Medicine

## 2014-12-23 ENCOUNTER — Encounter: Payer: Self-pay | Admitting: Family Medicine

## 2014-12-23 VITALS — BP 132/82 | HR 75 | Temp 98.2°F | Wt 238.5 lb

## 2014-12-23 DIAGNOSIS — N63 Unspecified lump in breast: Secondary | ICD-10-CM | POA: Diagnosis not present

## 2014-12-23 DIAGNOSIS — N6320 Unspecified lump in the left breast, unspecified quadrant: Secondary | ICD-10-CM | POA: Insufficient documentation

## 2014-12-23 NOTE — Assessment & Plan Note (Signed)
New- unable to locate area of concern today. Refer for diagnostic mammogram/ultrasound. The patient indicates understanding of these issues and agrees with the plan.

## 2014-12-23 NOTE — Addendum Note (Signed)
Addended by: Lucille Passy on: 12/23/2014 02:31 PM   Modules accepted: Orders

## 2014-12-23 NOTE — Progress Notes (Signed)
Subjective:   Patient ID: Lisa Osborne, female    DOB: Nov 14, 1940, 74 y.o.   MRN: [id number]  Lisa Osborne is a pleasant 74 y.o. year old female, new to me, of Dr. Darnell Level who presents to clinic today with Breast Mass  on 12/23/2014  HPI:  Felt a lump at "bottom" of left breast a couple of weeks ago- now tender to palpation. No discharge or changes from her nipples.  No fevers, chills, fatigue or night sweats.  Last mammogram neg 12/03/13.  PMH significant for h/o colon cancer s/p laparoscopic LAR 2014. Recall colonoscopy 1 yr later with polyps, rec recall 3 yrs [name]).  + family h/o breast CA- mother at age 51. Current Outpatient Prescriptions on File Prior to Visit  Medication Sig Dispense Refill  . Calcium Citrate-Vitamin D (CALCIUM CITRATE + D PO) Take by mouth daily.    . Coenzyme Q10 (COQ10) 200 MG CAPS Take by mouth.    Marland Kitchen guaiFENesin-codeine (ROBITUSSIN AC) 100-10 MG/5ML syrup Take 5 mLs by mouth at bedtime as needed. 160 mL 0  . lansoprazole (PREVACID) 15 MG capsule Take one tablet daily 90 capsule 3  . levothyroxine (SYNTHROID, LEVOTHROID) 25 MCG tablet TAKE ONE TABLET BY MOUTH DAILY 90 tablet 3  . loratadine (CLARITIN) 10 MG tablet Take 10 mg by mouth daily as needed for allergies.    . Omega-3 Fatty Acids (FISH OIL) 1000 MG CAPS Take 1 capsule by mouth daily.    . polycarbophil (FIBERCON) 625 MG tablet Take 625 mg by mouth daily. Not sure of dose    . sertraline (ZOLOFT) 100 MG tablet Take 1 tablet (100 mg total) by mouth daily. 90 tablet 3  . vitamin B-12 (CYANOCOBALAMIN) 1000 MCG tablet Take 1,000 mcg by mouth daily.     No current facility-administered medications on file prior to visit.    Allergies  Allergen Reactions  . Latex     Mild reaction  . Nickel     Slight itch  . Shrimp [Shellfish Allergy]     Slight allergy - itching    Past Medical History  Diagnosis Date  . History of iron deficiency anemia   . Arthritis     left side  lower back pain  . History of chicken pox   . Depression     severe in past  . Diabetes mellitus type 2, controlled ([facility]) 2006    diet controlled  . GERD (gastroesophageal reflux disease)     bad if off PPI  . Seasonal allergies   . Hypothyroidism     mild  . Urine incontinence     mild, wears pad  . Unilateral deafnesses     left ear deaf, wears hearing aid right side  . Rosacea   . Anemia   . Colon adenocarcinoma - rectosigmoid T3N1 (1/15) s/p lap LAR 04/14/2012    Status post laparoscopic lower anterior resection  . Abnormal EKG     incomplete RBBB    Past Surgical History  Procedure Laterality Date  . Total vaginal hysterectomy  1985    cancer cells on cervix  . Abdominoplasty  1996  . Incontinence surgery  1996    bladder sling  . Colonoscopy  08/2006    normal  . Facelift  05/2011  . Abdominal hysterectomy  1985    total  . Laparoscopic low anterior resection N/A 04/14/2012    colon cancer at rectosigmoid junction s/p rigid proctoscopy splenic flexure mobilization lysis of adhesion;  Surgeon: Adin Hector, MD;   . Colonoscopy  03/2013    2 polyps, mild diverticulosis, rpt 3 yrs [name])    Family History  Problem Relation Age of Onset  . Breast cancer Mother 33    breast cancer then multiple myeloma  . Cancer Mother     breast  . Alcohol abuse Father   . Heart disease Father     heart failure  . Mental illness Brother     2 brothers severe depression, suicide x2  . Diabetes Maternal Aunt   . Diabetes Maternal Uncle   . Diabetes Paternal Uncle   . Diabetes Paternal Grandfather   . Coronary artery disease Neg Hx   . Stroke Neg Hx     Social History   Social History  . Marital Status: Single    Spouse Name: N/A  . Number of Children: 1  . Years of Education: N/A   Occupational History  . Retired Pharmacist, hospital    Social History Main Topics  . Smoking status: Former Smoker    Quit date: 04/06/1967  . Smokeless tobacco: Not on file  . Alcohol Use:  Yes     Comment: seldom  . Drug Use: No  . Sexual Activity: Not on file   Other Topics Concern  . Not on file   Social History Narrative   Caffeine: 1 cup coffee/day   Lives alone.  1 daughter [name]), 3 stillborns   Occupation: retired, was Chief Technology Officer.   Activity: no regular activity but does park far away, has treadmill at home   Diet: good fruits/vegetables, more fruit/vegetable smoothies  good amt water   The PMH, PSH, Social History, Family History, Medications, and allergies have been reviewed in [facility], and have been updated if relevant.    Review of Systems  Constitutional: Negative.   Musculoskeletal: Negative.   All other systems reviewed and are negative.      Objective:    BP 132/82 mmHg  Pulse 75  Temp(Src) 98.2 F (36.8 C) (Oral)  Wt 238 lb 8 oz (108.183 kg)  SpO2 98%   Physical Exam  Constitutional: She is oriented to person, place, and time. She appears well-developed and well-nourished. No distress.  HENT:  Head: Normocephalic.  Eyes: Conjunctivae are normal.  Cardiovascular: Normal rate.   Pulmonary/Chest:  Unable to find mass on exam and neither was pt  Neurological: She is alert and oriented to person, place, and time. No cranial nerve deficit.  Skin: Skin is warm and dry.  Psychiatric: She has a normal mood and affect. Her behavior is normal. Judgment normal.  Nursing note and vitals reviewed.         Assessment & Plan:   Left breast mass No Follow-up on file.

## 2014-12-23 NOTE — Patient Instructions (Signed)
It was lovely to meet you. Happy Holidays. Please stop by to see Lisa Osborne on your way out.

## 2015-01-08 ENCOUNTER — Other Ambulatory Visit: Payer: Self-pay | Admitting: Family Medicine

## 2015-01-08 ENCOUNTER — Encounter: Payer: Self-pay | Admitting: *Deleted

## 2015-01-08 ENCOUNTER — Ambulatory Visit
Admission: RE | Admit: 2015-01-08 | Discharge: 2015-01-08 | Disposition: A | Discharge status: Home / Self Care | Payer: Medicare PPO | Source: Ambulatory Visit | Attending: Family Medicine | Admitting: Family Medicine

## 2015-01-08 DIAGNOSIS — N6320 Unspecified lump in the left breast, unspecified quadrant: Secondary | ICD-10-CM

## 2015-01-08 DIAGNOSIS — R928 Other abnormal and inconclusive findings on diagnostic imaging of breast: Secondary | ICD-10-CM | POA: Insufficient documentation

## 2015-01-08 DIAGNOSIS — N63 Unspecified lump in breast: Secondary | ICD-10-CM | POA: Diagnosis not present

## 2015-01-08 LAB — HM MAMMOGRAPHY: HM Mammogram: NORMAL

## 2015-02-10 ENCOUNTER — Ambulatory Visit (HOSPITAL_BASED_OUTPATIENT_CLINIC_OR_DEPARTMENT_OTHER): Payer: Medicare Other | Admitting: Oncology

## 2015-02-10 ENCOUNTER — Other Ambulatory Visit (HOSPITAL_BASED_OUTPATIENT_CLINIC_OR_DEPARTMENT_OTHER): Payer: Medicare Other

## 2015-02-10 ENCOUNTER — Telehealth: Payer: Self-pay | Admitting: Oncology

## 2015-02-10 VITALS — BP 144/68 | HR 80 | Temp 97.8°F | Resp 18 | Ht 69.0 in | Wt 239.4 lb

## 2015-02-10 DIAGNOSIS — R970 Elevated carcinoembryonic antigen [CEA]: Secondary | ICD-10-CM

## 2015-02-10 DIAGNOSIS — E119 Type 2 diabetes mellitus without complications: Secondary | ICD-10-CM | POA: Diagnosis not present

## 2015-02-10 DIAGNOSIS — Z85038 Personal history of other malignant neoplasm of large intestine: Secondary | ICD-10-CM | POA: Diagnosis not present

## 2015-02-10 DIAGNOSIS — D508 Other iron deficiency anemias: Secondary | ICD-10-CM

## 2015-02-10 DIAGNOSIS — R002 Palpitations: Secondary | ICD-10-CM

## 2015-02-10 DIAGNOSIS — C189 Malignant neoplasm of colon, unspecified: Secondary | ICD-10-CM

## 2015-02-10 LAB — CBC WITH DIFFERENTIAL/PLATELET
BASO%: 0.5 % (ref 0.0–2.0)
BASOS ABS: 0 10*3/uL (ref 0.0–0.1)
EOS ABS: 0 10*3/uL (ref 0.0–0.5)
EOS%: 0.8 % (ref 0.0–7.0)
HCT: 39.7 % (ref 34.8–46.6)
HGB: 13.2 g/dL (ref 11.6–15.9)
LYMPH%: 31.6 % (ref 14.0–49.7)
MCH: 30.8 pg (ref 25.1–34.0)
MCHC: 33.2 g/dL (ref 31.5–36.0)
MCV: 92.7 fL (ref 79.5–101.0)
MONO#: 0.4 10*3/uL (ref 0.1–0.9)
MONO%: 7.2 % (ref 0.0–14.0)
NEUT#: 3 10*3/uL (ref 1.5–6.5)
NEUT%: 59.9 % (ref 38.4–76.8)
Platelets: 201 10*3/uL (ref 145–400)
RBC: 4.29 10*6/uL (ref 3.70–5.45)
RDW: 15 % — AB (ref 11.2–14.5)
WBC: 4.9 10*3/uL (ref 3.9–10.3)
lymph#: 1.6 10*3/uL (ref 0.9–3.3)

## 2015-02-10 NOTE — Progress Notes (Signed)
  [place] OFFICE PROGRESS NOTE   Diagnosis: Colon cancer  INTERVAL HISTORY:   Lisa Osborne returns as scheduled. She feels well. No bleeding. She is taking iron once daily. She noted a nodule in the left breast in December. She underwent a left breast ultrasound and mammogram that were negative.  Objective:  Vital signs in last 24 hours:  Blood pressure 144/68, pulse 80, temperature 97.8 F (36.6 C), temperature source Oral, resp. rate 18, height _0  (1.753 m), weight 239 lb 6.4 oz (108.591 kg), SpO2 98 %.    HEENT: Neck without mass Lymphatics: No cervical, supraclavicular, axillary, or inguinal nodes Resp: Lungs clear bilaterally Cardio: Regular rate and rhythm GI: No hepatosplenomegaly, no mass Vascular: No leg edema   Lab Results:  Lab Results  Component Value Date   WBC 4.9 02/10/2015   HGB 13.2 02/10/2015   HCT 39.7 02/10/2015   MCV 92.7 02/10/2015   PLT 201 02/10/2015   NEUTROABS 3.0 02/10/2015      Lab Results  Component Value Date   CEA 0.8 09/09/2014     Medications: I have reviewed the patient's current medications.  Assessment/Plan: 1. Moderately differentiated adenocarcinoma of the rectosigmoid colon, microsatellite stable, stage IIIB (T4 N1) status post low anterior resection on 04/14/2012. She completed cycle 1 adjuvant Xeloda 05/15/2012. Cycle 2 held due to to diarrhea. She completed cycle 2 at a reduced dose (1500 mg twice daily for 14 days) beginning 06/21/2012. She began cycle 3 on 07/12/2012, cycle 4 08/02/2012, cycle 5 08/30/2012, cycle 6 10/03/2012(dose reduced to 1500 mg in the morning and 1000 mg in the evening due to hand-foot syndrome). Cycle 7 10/24/2012. Cycle 8 completed beginning 11/14/2012.  CTs of chest, abdomen, and pelvis 03/05/2013-negative.  Colonoscopy 04/05/2013. 2 sessile polyps measuring 3 mm in size were found in the descending colon (small tubular adenoma admixed with a fragment of benign colonic  mucosa. No high-grade dysplasia or invasive malignancy). Mild diverticulosis noted in the descending colon. Colon otherwise normal. Next colonoscopy in 3 years.  CTs 03/11/2014-negative 2. Elevated preoperative CEA. 3. Diabetes. 4. History of anemia. Hemoglobin was lower on 09/09/2014. Oral iron initiated following that visit. Hemoglobin has corrected into the normal range. Stool Hemoccult cards negative, urinalysis negative for blood  5. Palpitations-an EKG reveals a sinus arrhythmia. It was recommended she follow-up with Dr. Danise Mina to evaluate the palpitations    Disposition:  Ms. Pallett remains in clinical remission from colon cancer. We will follow-up on the CEA from today. She will return for an office visit and CEA in 6 months. She will be scheduled for surveillance CT scans.  Betsy Coder, MD  02/10/2015  11:41 AM

## 2015-02-10 NOTE — Telephone Encounter (Signed)
per pof to sch pt appt-gave pt copy of avs °

## 2015-02-11 LAB — CEA (PARALLEL TESTING): CEA: 1.2 ng/mL (ref 0.0–5.0)

## 2015-02-11 LAB — CEA: CEA1: 2.2 ng/mL (ref 0.0–4.7)

## 2015-02-12 ENCOUNTER — Telehealth: Payer: Self-pay | Admitting: *Deleted

## 2015-02-12 NOTE — Telephone Encounter (Signed)
-----   Message from Ladell Pier, MD sent at 02/11/2015  4:55 PM EST ----- Please call patient, cea is normal

## 2015-02-12 NOTE — Telephone Encounter (Signed)
Per Dr. Sherrill; notified pt that cea is normal.  Pt verbalized understanding and expressed appreciation for call. 

## 2015-02-26 DIAGNOSIS — E860 Dehydration: Secondary | ICD-10-CM

## 2015-02-26 HISTORY — DX: Dehydration: E86.0

## 2015-03-06 ENCOUNTER — Ambulatory Visit
Admission: RE | Admit: 2015-03-06 | Discharge: 2015-03-06 | Disposition: A | Discharge status: Home / Self Care | Payer: Medicare Other | Source: Ambulatory Visit | Attending: Oncology | Admitting: Oncology

## 2015-03-06 DIAGNOSIS — K760 Fatty (change of) liver, not elsewhere classified: Secondary | ICD-10-CM | POA: Diagnosis not present

## 2015-03-06 DIAGNOSIS — D508 Other iron deficiency anemias: Secondary | ICD-10-CM | POA: Insufficient documentation

## 2015-03-06 DIAGNOSIS — I700 Atherosclerosis of aorta: Secondary | ICD-10-CM | POA: Insufficient documentation

## 2015-03-06 DIAGNOSIS — K7689 Other specified diseases of liver: Secondary | ICD-10-CM | POA: Insufficient documentation

## 2015-03-06 DIAGNOSIS — C189 Malignant neoplasm of colon, unspecified: Secondary | ICD-10-CM | POA: Insufficient documentation

## 2015-03-06 LAB — POCT I-STAT CREATININE: Creatinine, Ser: 1.2 mg/dL — ABNORMAL HIGH (ref 0.44–1.00)

## 2015-03-06 MED ORDER — IOHEXOL 300 MG/ML  SOLN
80.0000 mL | Freq: Once | INTRAMUSCULAR | Status: AC | PRN
  Administered 2015-03-06: 80 mL via INTRAVENOUS

## 2015-03-07 ENCOUNTER — Telehealth: Payer: Self-pay | Admitting: *Deleted

## 2015-03-07 ENCOUNTER — Telehealth: Payer: Self-pay | Admitting: Oncology

## 2015-03-07 DIAGNOSIS — C189 Malignant neoplasm of colon, unspecified: Secondary | ICD-10-CM

## 2015-03-07 NOTE — Telephone Encounter (Signed)
Per Dr. Benay Spice; notified pt that CTs are negative for cancer, renal function mildly elevated, schedulers will call with date/time to repeat lab work.  Pt verbalized understanding and expressed appreciation for call.

## 2015-03-07 NOTE — Telephone Encounter (Signed)
Spoke with patient to confirm lab only appointment for 2/13 per 2/10 pof

## 2015-03-07 NOTE — Telephone Encounter (Signed)
-----   Message from Ladell Pier, MD sent at 03/06/2015  6:49 PM EST ----- Please call patient, CTs are negative for cancer, renal function mildly elevated, repeat bmet next 2-3 days

## 2015-03-10 ENCOUNTER — Telehealth: Payer: Self-pay | Admitting: Oncology

## 2015-03-10 ENCOUNTER — Other Ambulatory Visit: Payer: Medicare Other

## 2015-03-10 NOTE — Telephone Encounter (Signed)
Returned call to patient to r/s her lab appointment to Wed this week

## 2015-03-12 ENCOUNTER — Other Ambulatory Visit (HOSPITAL_BASED_OUTPATIENT_CLINIC_OR_DEPARTMENT_OTHER): Payer: Medicare Other

## 2015-03-12 ENCOUNTER — Telehealth: Payer: Self-pay | Admitting: *Deleted

## 2015-03-12 DIAGNOSIS — Z85038 Personal history of other malignant neoplasm of large intestine: Secondary | ICD-10-CM

## 2015-03-12 DIAGNOSIS — C189 Malignant neoplasm of colon, unspecified: Secondary | ICD-10-CM

## 2015-03-12 LAB — BASIC METABOLIC PANEL
Anion Gap: 13 mEq/L — ABNORMAL HIGH (ref 3–11)
BUN: 29.6 mg/dL — AB (ref 7.0–26.0)
CHLORIDE: 99 meq/L (ref 98–109)
CO2: 25 meq/L (ref 22–29)
Calcium: 9.3 mg/dL (ref 8.4–10.4)
Creatinine: 1.6 mg/dL — ABNORMAL HIGH (ref 0.6–1.1)
EGFR: 33 mL/min/{1.73_m2} — ABNORMAL LOW (ref >90)
GLUCOSE: 186 mg/dL — AB (ref 70–140)
POTASSIUM: 3.3 meq/L — AB (ref 3.5–5.1)
Sodium: 137 mEq/L (ref 136–145)

## 2015-03-12 NOTE — Telephone Encounter (Signed)
Per Dr. Benay Spice; left voice message for pt to return call (renal function is abnormal, needs to see Dr. Danise Mina; does she have diarrhea/dehydration)  Labs routed to Dr. Danise Mina with request for appt.

## 2015-03-12 NOTE — Telephone Encounter (Signed)
-----   Message from Ladell Pier, MD sent at 03/12/2015  1:18 PM EST ----- Please call patient, renal function is abnormal, needs to see Dr. Danise Mina, does she have diarrhea/dehydration Copy lab with Dr. Danise Mina  With request for appt.

## 2015-03-13 NOTE — Telephone Encounter (Signed)
Message left for patient to return my call to schedule appt for 1PM on 03/14/15.

## 2015-03-17 ENCOUNTER — Encounter: Payer: Self-pay | Admitting: Family Medicine

## 2015-03-17 ENCOUNTER — Other Ambulatory Visit: Payer: Self-pay | Admitting: Family Medicine

## 2015-03-17 ENCOUNTER — Ambulatory Visit (INDEPENDENT_AMBULATORY_CARE_PROVIDER_SITE_OTHER): Payer: Medicare Other | Admitting: Family Medicine

## 2015-03-17 VITALS — BP 134/76 | HR 69 | Temp 100.2°F

## 2015-03-17 DIAGNOSIS — R509 Fever, unspecified: Secondary | ICD-10-CM | POA: Insufficient documentation

## 2015-03-17 DIAGNOSIS — F32A Depression, unspecified: Secondary | ICD-10-CM

## 2015-03-17 DIAGNOSIS — R5383 Other fatigue: Secondary | ICD-10-CM

## 2015-03-17 DIAGNOSIS — N179 Acute kidney failure, unspecified: Secondary | ICD-10-CM | POA: Diagnosis not present

## 2015-03-17 DIAGNOSIS — R5381 Other malaise: Secondary | ICD-10-CM | POA: Diagnosis not present

## 2015-03-17 DIAGNOSIS — C189 Malignant neoplasm of colon, unspecified: Secondary | ICD-10-CM

## 2015-03-17 DIAGNOSIS — R112 Nausea with vomiting, unspecified: Secondary | ICD-10-CM

## 2015-03-17 DIAGNOSIS — F329 Major depressive disorder, single episode, unspecified: Secondary | ICD-10-CM

## 2015-03-17 LAB — POCT URINALYSIS DIPSTICK
Bilirubin, UA: NEGATIVE
GLUCOSE UA: NEGATIVE
KETONES UA: NEGATIVE
Leukocytes, UA: NEGATIVE
Nitrite, UA: NEGATIVE
RBC UA: NEGATIVE
SPEC GRAV UA: 1.015
Urobilinogen, UA: 0.2
pH, UA: 8

## 2015-03-17 LAB — COMPREHENSIVE METABOLIC PANEL
ALBUMIN: 4.6 g/dL (ref 3.5–5.2)
ALK PHOS: 64 U/L (ref 39–117)
ALT: 12 U/L (ref 0–35)
AST: 19 U/L (ref 0–37)
BUN: 13 mg/dL (ref 6–23)
CO2: 30 mEq/L (ref 19–32)
Calcium: 9.1 mg/dL (ref 8.4–10.5)
Chloride: 95 mEq/L — ABNORMAL LOW (ref 96–112)
Creatinine, Ser: 0.93 mg/dL (ref 0.40–1.20)
GFR: 62.78 mL/min (ref >60.00)
Glucose, Bld: 183 mg/dL — ABNORMAL HIGH (ref 70–99)
POTASSIUM: 3.5 meq/L (ref 3.5–5.1)
Sodium: 137 mEq/L (ref 135–145)
TOTAL PROTEIN: 8.1 g/dL (ref 6.0–8.3)
Total Bilirubin: 0.4 mg/dL (ref 0.2–1.2)

## 2015-03-17 LAB — CBC WITH DIFFERENTIAL/PLATELET
BASOS ABS: 0 10*3/uL (ref 0.0–0.1)
Basophils Relative: 0.1 % (ref 0.0–3.0)
Eosinophils Absolute: 0 10*3/uL (ref 0.0–0.7)
Eosinophils Relative: 0 % (ref 0.0–5.0)
HEMATOCRIT: 40.8 % (ref 36.0–46.0)
HEMOGLOBIN: 13.7 g/dL (ref 12.0–15.0)
LYMPHS PCT: 3.2 % — AB (ref 12.0–46.0)
Lymphs Abs: 0.5 10*3/uL — ABNORMAL LOW (ref 0.7–4.0)
MCHC: 33.6 g/dL (ref 30.0–36.0)
MCV: 92.9 fl (ref 78.0–100.0)
MONOS PCT: 3.4 % (ref 3.0–12.0)
Monocytes Absolute: 0.5 10*3/uL (ref 0.1–1.0)
Neutro Abs: 14.7 10*3/uL — ABNORMAL HIGH (ref 1.4–7.7)
Neutrophils Relative %: 93.3 % — ABNORMAL HIGH (ref 43.0–77.0)
Platelets: 235 10*3/uL (ref 150.0–400.0)
RBC: 4.39 Mil/uL (ref 3.87–5.11)
RDW: 13.6 % (ref 11.5–15.5)
WBC: 15.8 10*3/uL — ABNORMAL HIGH (ref 4.0–10.5)

## 2015-03-17 LAB — LIPASE: Lipase: 6 U/L — ABNORMAL LOW (ref 11.0–59.0)

## 2015-03-17 LAB — TSH: TSH: 3.26 u[IU]/mL (ref 0.35–4.50)

## 2015-03-17 MED ORDER — ONDANSETRON HCL 4 MG PO TABS: 4.0000 mg | ORAL_TABLET | Freq: Three times a day (TID) | ORAL | Status: DC | PRN

## 2015-03-17 MED ORDER — ONDANSETRON 4 MG PO TBDP
4.0000 mg | ORAL_TABLET | Freq: Once | ORAL | Status: AC
  Administered 2015-03-17: 4 mg via ORAL

## 2015-03-17 NOTE — Assessment & Plan Note (Signed)
Decrease sertraline to 50mg  daily.

## 2015-03-17 NOTE — Assessment & Plan Note (Signed)
Clinical remission on latest onc evaluation

## 2015-03-17 NOTE — Assessment & Plan Note (Signed)
In setting of recent malaise/vomiting and contrasted CT scan. Recheck STAT labs today.

## 2015-03-17 NOTE — Assessment & Plan Note (Signed)
3 wk h/o constipation, nausea/vomiting with low grade fever without diarrhea or evidence of obstruction. Not consistent with biliary colic. ?viral gastritis but prolonged for this. ddx includes cholecystitis. Check STAT labs today including CBC, CMP, TSH, lipase. Tired and weak appearing but overall seems hydrated and non toxic. Ok to await STAT labs outpatient but I did discuss red flags to seek ER care. Pt agrees with plan - says she has daughter who would be able to drive her to ER today.

## 2015-03-17 NOTE — Addendum Note (Signed)
Addended by: Helene Shoe on: 03/17/2015 03:10 PM   Modules accepted: Orders

## 2015-03-17 NOTE — Patient Instructions (Addendum)
Take zofran for nausea as needed - sent to pharmacy. Urine checked today looking ok. STAT labwork today - we will call you later today Decrease sertraline to 50mg  daily (cut current dose in half). Push small sips of fluids, bland diet over next few days.  If not improving or any worsening please seek ER care.

## 2015-03-17 NOTE — Progress Notes (Signed)
Pre visit review using our clinic review tool, if applicable. No additional management support is needed unless otherwise documented below in the visit note. 

## 2015-03-17 NOTE — Progress Notes (Signed)
BP 160/80 mmHg  Pulse 69  Temp(Src) 100.2 F (37.9 C) (Oral)  Wt   SpO2 95%   CC: f/u labs, malaise  Subjective:    Patient ID: Lisa Osborne, female    DOB: 1940/07/06, 75 y.o.   MRN: [id number]  HPI: Lisa Osborne is a 75 y.o. female presenting on 03/17/2015 for follow up on labs; Fever; constipation and vomiting; Dizziness; and Insomnia   Followed by Dr Benay Spice for colon cancer - completed chemo Xeloda 2014, last saw onc 01/2015 - in clinical remission from colon cancer at that time.  Dr Gearldine Shown office asked to see [place] for elevated Cr at 1.6 with BUN 29. Prior reading 1.2 and before that 0.95 (05/2014).  She had just completed contrasted CT scan for colon cancer surveillance (03/07/2015).   In wheelchair today and not feeling well. She has been sick for 3 weeks - nausea/vomiting, dizzy and weak, constipated. vomiting x4-5 per day. NBNB. Very small amounts of hard stool, but passing gas ok. Progressively worsening over the past week. Occasional headache, chest pain thinks from constant throwing up. + fevers and subjective chills. Endorses chronic lower back pain unchanged.  Started after eating chicken salad - ?food poisoning. Barium or recent CT worsened emesis.   Denies diarrhea or significant abd pain.  Lives alone. Drove here herself. Currently in wheelchair. No sick contacts at home. No new foods or medications.   + urgency, frequency without dysuria. No hematuria, abd pain, flank pain. No recent abx use.   Unable to keep solids down. Has tried to increase water and ginger ale intake.   Relevant past medical, surgical, family and social history reviewed and updated as indicated. Interim medical history since our last visit reviewed. Allergies and medications reviewed and updated. Current Outpatient Prescriptions on File Prior to Visit  Medication Sig  . Calcium Citrate-Vitamin D (CALCIUM CITRATE + D PO) Take by mouth daily.  . Coenzyme Q10 (COQ10) 200 MG  CAPS Take by mouth.  . lansoprazole (PREVACID) 15 MG capsule Take one tablet daily  . levothyroxine (SYNTHROID, LEVOTHROID) 25 MCG tablet TAKE ONE TABLET BY MOUTH DAILY  . Melatonin 5 MG TABS Take by mouth daily.  . Omega-3 Fatty Acids (FISH OIL) 1000 MG CAPS Take 1 capsule by mouth daily.  . polycarbophil (FIBERCON) 625 MG tablet Take 625 mg by mouth daily. Not sure of dose  . vitamin B-12 (CYANOCOBALAMIN) 1000 MCG tablet Take 1,000 mcg by mouth daily.  Marland Kitchen guaiFENesin-codeine (ROBITUSSIN AC) 100-10 MG/5ML syrup Take 5 mLs by mouth at bedtime as needed. (Patient not taking: Reported on 03/17/2015)  . loratadine (CLARITIN) 10 MG tablet Take 10 mg by mouth daily as needed for allergies. Reported on 03/17/2015   No current facility-administered medications on file prior to visit.    Review of Systems Per HPI unless specifically indicated in ROS section     Objective:    BP 160/80 mmHg  Pulse 69  Temp(Src) 100.2 F (37.9 C) (Oral)  Wt   SpO2 95%  Wt Readings from Last 3 Encounters:  02/10/15 239 lb 6.4 oz (108.591 kg)  12/23/14 238 lb 8 oz (108.183 kg)  10/14/14 239 lb 8 oz (108.636 kg)    Physical Exam  Constitutional:  In wheelchair today  HENT:  Mouth/Throat: Oropharynx is clear and moist. No oropharyngeal exudate.  Eyes: Conjunctivae and EOM are normal. Pupils are equal, round, and reactive to light. No scleral icterus.  Some palpebral conjunctival pallor  Neck: Normal range  of motion. Neck supple. No thyromegaly present.  Cardiovascular: Normal rate, normal heart sounds and intact distal pulses.   No murmur heard. Pulmonary/Chest: Effort normal and breath sounds normal. No respiratory distress. She has no wheezes. She has no rales.  Abdominal: Soft. Normal appearance and bowel sounds are normal. She exhibits no distension and no mass. There is no hepatosplenomegaly. There is tenderness (mild). There is no rigidity, no rebound, no guarding, no CVA tenderness and negative  Murphy's sign.  Musculoskeletal: She exhibits no edema.  Lymphadenopathy:    She has no cervical adenopathy.  Skin: Skin is warm and dry. No rash noted. There is pallor.  Nursing note and vitals reviewed.  Results for orders placed or performed in visit on 03/17/15  POCT Urinalysis Dipstick  Result Value Ref Range   Color, UA yellow    Clarity, UA clear    Glucose, UA neg    Bilirubin, UA neg    Ketones, UA neg    Spec Grav, UA 1.015    Blood, UA neg    pH, UA 8.0    Protein, UA 1+    Urobilinogen, UA 0.2    Nitrite, UA neg    Leukocytes, UA Negative Negative      Assessment & Plan:   Problem List Items Addressed This Visit    Nausea with vomiting    3 wk h/o constipation, nausea/vomiting with low grade fever without diarrhea or evidence of obstruction. Not consistent with biliary colic. ?viral gastritis but prolonged for this. ddx includes cholecystitis. Check STAT labs today including CBC, CMP, TSH, lipase. Tired and weak appearing but overall seems hydrated and non toxic. Ok to await STAT labs outpatient but I did discuss red flags to seek ER care. Pt agrees with plan - says she has daughter who would be able to drive her to ER today.      Relevant Orders   Comprehensive metabolic panel   TSH   CBC with Differential/Platelet   Lipase   Malaise and fatigue   Relevant Orders   Comprehensive metabolic panel   TSH   CBC with Differential/Platelet   Lipase   Fever   Depression    Decrease sertraline to 50mg  daily.      Relevant Medications   sertraline (ZOLOFT) 50 MG tablet   Colon adenocarcinoma - rectosigmoid T3N1 (1/15) s/p lap LAR 04/14/2012    Clinical remission on latest onc evaluation      Acute kidney injury ([place]) - Primary    In setting of recent malaise/vomiting and contrasted CT scan. Recheck STAT labs today.      Relevant Orders   POCT Urinalysis Dipstick (Completed)   Comprehensive metabolic panel   CBC with Differential/Platelet        Follow up plan: Return if symptoms worsen or fail to improve.

## 2015-03-18 ENCOUNTER — Encounter: Payer: Self-pay | Admitting: Emergency Medicine

## 2015-03-18 ENCOUNTER — Other Ambulatory Visit: Payer: Self-pay

## 2015-03-18 ENCOUNTER — Ambulatory Visit
Admission: RE | Admit: 2015-03-18 | Discharge: 2015-03-18 | Disposition: A | Discharge status: Home / Self Care | Payer: Medicare Other | Source: Ambulatory Visit | Attending: Family Medicine | Admitting: Family Medicine

## 2015-03-18 ENCOUNTER — Inpatient Hospital Stay
Admission: EM | Admit: 2015-03-18 | Discharge: 2015-03-21 | DRG: 418 | Disposition: A | Discharge status: Home / Self Care | Payer: Medicare Other | Attending: General Surgery | Admitting: General Surgery

## 2015-03-18 ENCOUNTER — Telehealth: Payer: Self-pay

## 2015-03-18 DIAGNOSIS — K760 Fatty (change of) liver, not elsewhere classified: Secondary | ICD-10-CM | POA: Insufficient documentation

## 2015-03-18 DIAGNOSIS — R32 Unspecified urinary incontinence: Secondary | ICD-10-CM | POA: Diagnosis present

## 2015-03-18 DIAGNOSIS — E039 Hypothyroidism, unspecified: Secondary | ICD-10-CM | POA: Diagnosis present

## 2015-03-18 DIAGNOSIS — E119 Type 2 diabetes mellitus without complications: Secondary | ICD-10-CM | POA: Diagnosis present

## 2015-03-18 DIAGNOSIS — Z79899 Other long term (current) drug therapy: Secondary | ICD-10-CM

## 2015-03-18 DIAGNOSIS — H9192 Unspecified hearing loss, left ear: Secondary | ICD-10-CM | POA: Diagnosis present

## 2015-03-18 DIAGNOSIS — K7689 Other specified diseases of liver: Secondary | ICD-10-CM | POA: Insufficient documentation

## 2015-03-18 DIAGNOSIS — Z91013 Allergy to seafood: Secondary | ICD-10-CM | POA: Diagnosis not present

## 2015-03-18 DIAGNOSIS — E876 Hypokalemia: Secondary | ICD-10-CM | POA: Diagnosis present

## 2015-03-18 DIAGNOSIS — E860 Dehydration: Secondary | ICD-10-CM | POA: Diagnosis present

## 2015-03-18 DIAGNOSIS — R109 Unspecified abdominal pain: Secondary | ICD-10-CM

## 2015-03-18 DIAGNOSIS — N179 Acute kidney failure, unspecified: Secondary | ICD-10-CM | POA: Diagnosis present

## 2015-03-18 DIAGNOSIS — I10 Essential (primary) hypertension: Secondary | ICD-10-CM | POA: Diagnosis present

## 2015-03-18 DIAGNOSIS — R112 Nausea with vomiting, unspecified: Secondary | ICD-10-CM

## 2015-03-18 DIAGNOSIS — Z807 Family history of other malignant neoplasms of lymphoid, hematopoietic and related tissues: Secondary | ICD-10-CM | POA: Diagnosis not present

## 2015-03-18 DIAGNOSIS — Z803 Family history of malignant neoplasm of breast: Secondary | ICD-10-CM

## 2015-03-18 DIAGNOSIS — K8000 Calculus of gallbladder with acute cholecystitis without obstruction: Principal | ICD-10-CM | POA: Diagnosis present

## 2015-03-18 DIAGNOSIS — R509 Fever, unspecified: Secondary | ICD-10-CM | POA: Insufficient documentation

## 2015-03-18 DIAGNOSIS — Z91040 Latex allergy status: Secondary | ICD-10-CM

## 2015-03-18 DIAGNOSIS — R1011 Right upper quadrant pain: Secondary | ICD-10-CM | POA: Diagnosis not present

## 2015-03-18 DIAGNOSIS — Z888 Allergy status to other drugs, medicaments and biological substances status: Secondary | ICD-10-CM | POA: Diagnosis not present

## 2015-03-18 DIAGNOSIS — K219 Gastro-esophageal reflux disease without esophagitis: Secondary | ICD-10-CM | POA: Diagnosis present

## 2015-03-18 DIAGNOSIS — Z87891 Personal history of nicotine dependence: Secondary | ICD-10-CM | POA: Diagnosis not present

## 2015-03-18 DIAGNOSIS — Z794 Long term (current) use of insulin: Secondary | ICD-10-CM

## 2015-03-18 DIAGNOSIS — K8020 Calculus of gallbladder without cholecystitis without obstruction: Secondary | ICD-10-CM | POA: Insufficient documentation

## 2015-03-18 DIAGNOSIS — K810 Acute cholecystitis: Secondary | ICD-10-CM

## 2015-03-18 DIAGNOSIS — J302 Other seasonal allergic rhinitis: Secondary | ICD-10-CM | POA: Diagnosis present

## 2015-03-18 LAB — URINALYSIS COMPLETE WITH MICROSCOPIC (ARMC ONLY)
BILIRUBIN URINE: NEGATIVE
Bacteria, UA: NONE SEEN
GLUCOSE, UA: NEGATIVE mg/dL
Hgb urine dipstick: NEGATIVE
KETONES UR: NEGATIVE mg/dL
Leukocytes, UA: NEGATIVE
NITRITE: NEGATIVE
PH: 6 (ref 5.0–8.0)
Protein, ur: 100 mg/dL — AB
Specific Gravity, Urine: 1.023 (ref 1.005–1.030)

## 2015-03-18 LAB — COMPREHENSIVE METABOLIC PANEL
ALK PHOS: 62 U/L (ref 38–126)
ALT: 15 U/L (ref 14–54)
ANION GAP: 8 (ref 5–15)
AST: 23 U/L (ref 15–41)
Albumin: 3.8 g/dL (ref 3.5–5.0)
BILIRUBIN TOTAL: 0.7 mg/dL (ref 0.3–1.2)
BUN: 19 mg/dL (ref 6–20)
CALCIUM: 8.3 mg/dL — AB (ref 8.9–10.3)
CO2: 28 mmol/L (ref 22–32)
Chloride: 100 mmol/L — ABNORMAL LOW (ref 101–111)
Creatinine, Ser: 1.24 mg/dL — ABNORMAL HIGH (ref 0.44–1.00)
GFR calc non Af Amer: 42 mL/min — ABNORMAL LOW (ref >60)
GFR, EST AFRICAN AMERICAN: 49 mL/min — AB (ref >60)
GLUCOSE: 142 mg/dL — AB (ref 65–99)
Potassium: 3.7 mmol/L (ref 3.5–5.1)
Sodium: 136 mmol/L (ref 135–145)
TOTAL PROTEIN: 7.5 g/dL (ref 6.5–8.1)

## 2015-03-18 LAB — CBC
HCT: 37.7 % (ref 35.0–47.0)
HEMOGLOBIN: 12.8 g/dL (ref 12.0–16.0)
MCH: 31.2 pg (ref 26.0–34.0)
MCHC: 33.9 g/dL (ref 32.0–36.0)
MCV: 92.1 fL (ref 80.0–100.0)
Platelets: 187 10*3/uL (ref 150–440)
RBC: 4.09 MIL/uL (ref 3.80–5.20)
RDW: 13.6 % (ref 11.5–14.5)
WBC: 11.7 10*3/uL — ABNORMAL HIGH (ref 3.6–11.0)

## 2015-03-18 LAB — LIPASE, BLOOD: Lipase: 19 U/L (ref 11–51)

## 2015-03-18 MED ORDER — SODIUM CHLORIDE 0.9 % IV SOLN
Freq: Once | INTRAVENOUS | Status: AC
  Administered 2015-03-18: 18:00:00 via INTRAVENOUS

## 2015-03-18 MED ORDER — MORPHINE SULFATE (PF) 2 MG/ML IV SOLN
2.0000 mg | INTRAVENOUS | Status: DC | PRN
Start: 2015-03-18 — End: 2015-03-21
  Administered 2015-03-20 – 2015-03-21 (×4): 2 mg via INTRAVENOUS
  Filled 2015-03-18 (×4): qty 1

## 2015-03-18 MED ORDER — PANTOPRAZOLE SODIUM 40 MG IV SOLR
40.0000 mg | Freq: Every day | INTRAVENOUS | Status: DC
  Administered 2015-03-18 – 2015-03-20 (×3): 40 mg via INTRAVENOUS
  Filled 2015-03-18 (×3): qty 40

## 2015-03-18 MED ORDER — MORPHINE SULFATE (PF) 2 MG/ML IV SOLN
2.0000 mg | Freq: Once | INTRAVENOUS | Status: AC
  Administered 2015-03-18: 2 mg via INTRAVENOUS
  Filled 2015-03-18: qty 1

## 2015-03-18 MED ORDER — DIPHENHYDRAMINE HCL 50 MG/ML IJ SOLN: 12.5000 mg | Freq: Four times a day (QID) | INTRAMUSCULAR | Status: DC | PRN

## 2015-03-18 MED ORDER — SERTRALINE HCL 50 MG PO TABS
50.0000 mg | ORAL_TABLET | Freq: Every day | ORAL | Status: DC
  Administered 2015-03-19 – 2015-03-21 (×3): 50 mg via ORAL
  Filled 2015-03-18 (×3): qty 1

## 2015-03-18 MED ORDER — MELATONIN 5 MG PO TABS: 5.0000 mg | ORAL_TABLET | Freq: Every day | ORAL | Status: DC

## 2015-03-18 MED ORDER — MORPHINE SULFATE (PF) 4 MG/ML IV SOLN
INTRAVENOUS | Status: AC
  Administered 2015-03-18: 4 mg via INTRAVENOUS
  Filled 2015-03-18: qty 1

## 2015-03-18 MED ORDER — DIPHENHYDRAMINE HCL 12.5 MG/5ML PO ELIX: 12.5000 mg | ORAL_SOLUTION | Freq: Four times a day (QID) | ORAL | Status: DC | PRN

## 2015-03-18 MED ORDER — LACTATED RINGERS IV SOLN
INTRAVENOUS | Status: DC
  Administered 2015-03-18 – 2015-03-21 (×6): via INTRAVENOUS

## 2015-03-18 MED ORDER — MORPHINE SULFATE (PF) 4 MG/ML IV SOLN
4.0000 mg | Freq: Once | INTRAVENOUS | Status: AC
  Administered 2015-03-18: 4 mg via INTRAVENOUS

## 2015-03-18 MED ORDER — ONDANSETRON 8 MG PO TBDP: 4.0000 mg | ORAL_TABLET | Freq: Four times a day (QID) | ORAL | Status: DC | PRN

## 2015-03-18 MED ORDER — SODIUM CHLORIDE 0.9 % IV SOLN
1.5000 g | Freq: Once | INTRAVENOUS | Status: AC
  Administered 2015-03-18: 1.5 g via INTRAVENOUS
  Filled 2015-03-18: qty 1.5

## 2015-03-18 MED ORDER — HYDRALAZINE HCL 20 MG/ML IJ SOLN: 10.0000 mg | INTRAMUSCULAR | Status: DC | PRN

## 2015-03-18 MED ORDER — ONDANSETRON HCL 4 MG/2ML IJ SOLN
4.0000 mg | Freq: Four times a day (QID) | INTRAMUSCULAR | Status: DC | PRN
  Administered 2015-03-20 – 2015-03-21 (×2): 4 mg via INTRAVENOUS
  Filled 2015-03-18 (×2): qty 2

## 2015-03-18 MED ORDER — ONDANSETRON HCL 4 MG/2ML IJ SOLN
4.0000 mg | Freq: Once | INTRAMUSCULAR | Status: AC
Start: 2015-03-18 — End: 2015-03-18
  Administered 2015-03-18: 4 mg via INTRAVENOUS
  Filled 2015-03-18: qty 2

## 2015-03-18 MED ORDER — PIPERACILLIN-TAZOBACTAM 3.375 G IVPB
3.3750 g | Freq: Three times a day (TID) | INTRAVENOUS | Status: DC
  Administered 2015-03-18 – 2015-03-21 (×8): 3.375 g via INTRAVENOUS
  Filled 2015-03-18 (×11): qty 50

## 2015-03-18 NOTE — ED Provider Notes (Signed)
[facility] Emergency Department Provider Note  ____________________________________________  Time seen: 10:20 AM  I have reviewed the triage vital signs and the nursing notes.   HISTORY  Chief Complaint Abdominal Pain     HPI Lisa Osborne is a 75 y.o. female presents with right upper quadrant abdominal pain and vomiting 3 weeks with fever during that time as well. Patient states that she had an ultrasound performed today which revealed gallstones and she was referred to the emergency department by her primary care provider. Patient states that eating aggravates the pain denies any alleviating factors. Current pain score 4 out of 10 however nausea is considerably this time.     Past Medical History  Diagnosis Date  . History of iron deficiency anemia   . Arthritis     left side lower back pain  . History of chicken pox   . Depression     severe in past  . Diabetes mellitus type 2, controlled ([place]) 2006    diet controlled  . GERD (gastroesophageal reflux disease)     bad if off PPI  . Seasonal allergies   . Hypothyroidism     mild  . Urine incontinence     mild, wears pad  . Unilateral deafnesses     left ear deaf, wears hearing aid right side  . Rosacea   . Anemia   . Abnormal EKG     incomplete RBBB  . Colon adenocarcinoma - rectosigmoid T3N1 (1/15) s/p lap LAR 04/14/2012    Status post laparoscopic lower anterior resection    Patient Active Problem List   Diagnosis Date Noted  . Malaise and fatigue 03/17/2015  . Fever 03/17/2015  . Acute kidney injury ([place]) 03/17/2015  . Nausea with vomiting 03/17/2015  . Left breast mass 12/23/2014  . Abnormal EKG   . Health maintenance examination 06/11/2014  . Rash on lips 12/29/2012  . Colon adenocarcinoma - rectosigmoid T3N1 (1/15) s/p lap LAR 04/14/2012   . Medicare annual wellness visit, subsequent 10/12/2011  . Depression   . Diabetes mellitus type 2, controlled ([place])   . GERD  (gastroesophageal reflux disease)   . Hypothyroidism   . Urge incontinence     Past Surgical History  Procedure Laterality Date  . Total vaginal hysterectomy  1985    cancer cells on cervix  . Abdominoplasty  1996  . Incontinence surgery  1996  . Colonoscopy  08/2006    normal  . Facelift  05/2011  . Abdominal hysterectomy  1985    total  . Laparoscopic low anterior resection N/A 04/14/2012    colon cancer at rectosigmoid junction s/p rigid proctoscopy splenic flexure mobilization lysis of adhesion;  Surgeon: Adin Hector, MD;   . Colonoscopy  03/2013    2 polyps, mild diverticulosis, rpt 3 yrs [name])    Current Outpatient Rx  Name  Route  Sig  Dispense  Refill  . Calcium Citrate-Vitamin D (CALCIUM CITRATE + D PO)   Oral   Take by mouth daily.         . Coenzyme Q10 (COQ10) 200 MG CAPS   Oral   Take by mouth.         Marland Kitchen guaiFENesin-codeine (ROBITUSSIN AC) 100-10 MG/5ML syrup   Oral   Take 5 mLs by mouth at bedtime as needed. Patient not taking: Reported on 03/17/2015   160 mL   0   . lansoprazole (PREVACID) 15 MG capsule      Take one  tablet daily   90 capsule   3   . levothyroxine (SYNTHROID, LEVOTHROID) 25 MCG tablet      TAKE ONE TABLET BY MOUTH DAILY   90 tablet   3   . loratadine (CLARITIN) 10 MG tablet   Oral   Take 10 mg by mouth daily as needed for allergies. Reported on 03/17/2015         . Melatonin 5 MG TABS   Oral   Take by mouth daily.         . Omega-3 Fatty Acids (FISH OIL) 1000 MG CAPS   Oral   Take 1 capsule by mouth daily.         . ondansetron (ZOFRAN) 4 MG tablet   Oral   Take 1 tablet (4 mg total) by mouth every 8 (eight) hours as needed for nausea or vomiting.   20 tablet   0   . polycarbophil (FIBERCON) 625 MG tablet   Oral   Take 625 mg by mouth daily. Not sure of dose         . sertraline (ZOLOFT) 50 MG tablet   Oral   Take 1 tablet (50 mg total) by mouth daily.         . vitamin B-12 (CYANOCOBALAMIN)  1000 MCG tablet   Oral   Take 1,000 mcg by mouth daily.           Allergies Latex; Nickel; and Shrimp  Family History  Problem Relation Age of Onset  . Breast cancer Mother 61    breast cancer then multiple myeloma  . Cancer Mother     breast  . Alcohol abuse Father   . Heart disease Father     heart failure  . Mental illness Brother     2 brothers severe depression, suicide x2  . Diabetes Maternal Aunt   . Diabetes Maternal Uncle   . Diabetes Paternal Uncle   . Diabetes Paternal Grandfather   . Coronary artery disease Neg Hx   . Stroke Neg Hx     Social History Social History  Substance Use Topics  . Smoking status: Former Smoker    Quit date: 04/06/1967  . Smokeless tobacco: None  . Alcohol Use: Yes     Comment: seldom    Review of Systems  Constitutional: Positive for fever. Eyes: Negative for visual changes. ENT: Negative for sore throat. Cardiovascular: Negative for chest pain. Respiratory: Negative for shortness of breath. Gastrointestinal: Positive for abdominal pain and vomiting Genitourinary: Negative for dysuria. Musculoskeletal: Negative for back pain. Skin: Negative for rash. Neurological: Negative for headaches, focal weakness or numbness.   10-point ROS otherwise negative.  ____________________________________________   PHYSICAL EXAM:  VITAL SIGNS: ED Triage Vitals  Enc Vitals Group     BP 03/18/15 0943 112/68 mmHg     Pulse Rate 03/18/15 0943 123     Resp 03/18/15 0943 20     Temp 03/18/15 0943 100.5 F (38.1 C)     Temp Source 03/18/15 0943 Oral     SpO2 03/18/15 0943 94 %     Weight 03/18/15 0943 235 lb (106.595 kg)     Height 03/18/15 0943 5' 9" (1.753 m)     Head Cir --      Peak Flow --      Pain Score 03/18/15 0943 3     Pain Loc --      Pain Edu? --      Excl. in [place]? --  Constitutional: Alert and oriented. Well appearing and in no distress. Eyes: Conjunctivae are normal. PERRL. Normal extraocular  movements. ENT   Head: Normocephalic and atraumatic.   Nose: No congestion/rhinnorhea.   Mouth/Throat: Mucous membranes are moist.   Neck: No stridor. Hematological/Lymphatic/Immunilogical: No cervical lymphadenopathy. Cardiovascular: Normal rate, regular rhythm. Normal and symmetric distal pulses are present in all extremities. No murmurs, rubs, or gallops. Respiratory: Normal respiratory effort without tachypnea nor retractions. Breath sounds are clear and equal bilaterally. No wheezes/rales/rhonchi. Gastrointestinal:  right upper quadrant pain to palpation. No distention. There is no CVA tenderness. Genitourinary: deferred Musculoskeletal: Nontender with normal range of motion in all extremities. No joint effusions.  No lower extremity tenderness nor edema. Neurologic:  Normal speech and language. No gross focal neurologic deficits are appreciated. Speech is normal.  Skin:  Skin is warm, dry and intact. No rash noted. Psychiatric: Mood and affect are normal. Speech and behavior are normal. Patient exhibits appropriate insight and judgment.  ____________________________________________    LABS (pertinent positives/negatives)  Labs Reviewed  COMPREHENSIVE METABOLIC PANEL - Abnormal; Notable for the following:    Chloride 100 (*)    Glucose, Bld 142 (*)    Creatinine, Ser 1.24 (*)    Calcium 8.3 (*)    GFR calc non Af Amer 42 (*)    GFR calc Af Amer 49 (*)    All other components within normal limits  CBC - Abnormal; Notable for the following:    WBC 11.7 (*)    All other components within normal limits  LIPASE, BLOOD  URINALYSIS COMPLETEWITH MICROSCOPIC ([facility] ONLY)      RADIOLOGY  Ultrasound revealed multiple gallstones possibility of cholecystitis   INITIAL IMPRESSION / ASSESSMENT AND PLAN / ED COURSE  Pertinent labs & imaging results that were available during my care of the patient were reviewed by me and considered in my medical decision making (see  chart for details).  Patient discussed with OR nurse to convey a message Dr. Azalee Course. Patient received IV morphine and Zofran and Unasyn awaiting surgical consultation.  ____________________________________________   FINAL CLINICAL IMPRESSION(S) / ED DIAGNOSES  Final diagnoses:  Acute cholecystitis      Gregor Hams, MD 03/18/15 1534

## 2015-03-18 NOTE — H&P (Signed)
Patient ID: Lisa Osborne, female   DOB: 1940-12-10, 75 y.o.   MRN: [id number]  CC: NAUSEA AND VOMITING  HPI Lisa Osborne is a 75 y.o. female presents to emergency department for evaluation of right-sided abdominal pain and nausea and vomiting for 3 weeks. She has had intermittent fevers and chills during this time. But none last 2 days. She states that she been being worked up by her primary care and obtaining an ultrasound and draped her to come in the emergency department after the ultrasound this morning. Patient reports that she has continued to have intermittent abdominal pains however it is improved at the time of my history. Patient states that over the last 3 weeks she has had multiple episodes of nausea and vomiting every day and has not been to keep down solid foods at all for the last 3 weeks. Patient states over the time. She is also had constipation, abdominal pain, nausea, vomiting. She denies any chest pain or shortness of breath but states she has been having an irregular flutter in her chest with her heart rate. As time has progressed with the nausea and vomiting she's also had fatigue and malaise.   HPI  Past Medical History  Diagnosis Date  . History of iron deficiency anemia   . Arthritis     left side lower back pain  . History of chicken pox   . Depression     severe in past  . Diabetes mellitus type 2, controlled ([place]) 2006    diet controlled  . GERD (gastroesophageal reflux disease)     bad if off PPI  . Seasonal allergies   . Hypothyroidism     mild  . Urine incontinence     mild, wears pad  . Unilateral deafnesses     left ear deaf, wears hearing aid right side  . Rosacea   . Anemia   . Abnormal EKG     incomplete RBBB  . Colon adenocarcinoma - rectosigmoid T3N1 (1/15) s/p lap LAR 04/14/2012    Status post laparoscopic lower anterior resection    Past Surgical History  Procedure Laterality Date  . Total vaginal hysterectomy  1985   cancer cells on cervix  . Abdominoplasty  1996  . Incontinence surgery  1996  . Colonoscopy  08/2006    normal  . Facelift  05/2011  . Abdominal hysterectomy  1985    total  . Laparoscopic low anterior resection N/A 04/14/2012    colon cancer at rectosigmoid junction s/p rigid proctoscopy splenic flexure mobilization lysis of adhesion;  Surgeon: Adin Hector, MD;   . Colonoscopy  03/2013    2 polyps, mild diverticulosis, rpt 3 yrs [name])    Family History  Problem Relation Age of Onset  . Breast cancer Mother 76    breast cancer then multiple myeloma  . Cancer Mother     breast  . Alcohol abuse Father   . Heart disease Father     heart failure  . Mental illness Brother     2 brothers severe depression, suicide x2  . Diabetes Maternal Aunt   . Diabetes Maternal Uncle   . Diabetes Paternal Uncle   . Diabetes Paternal Grandfather   . Coronary artery disease Neg Hx   . Stroke Neg Hx     Social History Social History  Substance Use Topics  . Smoking status: Former Smoker    Quit date: 04/06/1967  . Smokeless tobacco: None  . Alcohol Use: Yes  Comment: seldom    Allergies  Allergen Reactions  . Latex     Mild reaction  . Nickel     Slight itch  . Shrimp [Shellfish Allergy]     Slight allergy - itching    Current Facility-Administered Medications  Medication Dose Route Frequency Provider Last Rate Last Dose  . diphenhydrAMINE (BENADRYL) 12.5 MG/5ML elixir 12.5 mg  12.5 mg Oral Q6H PRN Clayburn Pert, MD       Or  . diphenhydrAMINE (BENADRYL) injection 12.5 mg  12.5 mg Intravenous Q6H PRN Clayburn Pert, MD      . hydrALAZINE (APRESOLINE) injection 10 mg  10 mg Intravenous Q2H PRN Clayburn Pert, MD      . lactated ringers infusion   Intravenous Continuous Clayburn Pert, MD      . morphine 2 MG/ML injection 2 mg  2 mg Intravenous Q4H PRN Clayburn Pert, MD      . ondansetron (ZOFRAN-ODT) disintegrating tablet 4 mg  4 mg Oral Q6H PRN Clayburn Pert, MD        Or  . ondansetron [facility]) injection 4 mg  4 mg Intravenous Q6H PRN Clayburn Pert, MD      . pantoprazole (PROTONIX) injection 40 mg  40 mg Intravenous QHS Clayburn Pert, MD      . piperacillin-tazobactam (ZOSYN) IVPB 3.375 g  3.375 g Intravenous 3 times per day Clayburn Pert, MD      . Derrill Memo ON 03/19/2015] sertraline (ZOLOFT) tablet 50 mg  50 mg Oral Daily Clayburn Pert, MD         Review of Systems A multi-system review of systems was asked and was negative except for thefindings documented in the history of present illness .  Physical Exam Blood pressure 131/64, pulse 103, temperature 99.9 F (37.7 C), temperature source Oral, resp. rate 16, height 5' 9"  (1.753 m), weight 104.191 kg (229 lb 11.2 oz), SpO2 97 %. CONSTITUTIONAL:  No acute distress EYES: Pupils are equal, round, and reactive to light, Sclera are non-icteric. EARS, NOSE, MOUTH AND THROAT: The oropharynx is clear. The oral mucosa is pink and moist. Hearing is intact to voice. LYMPH NODES:  Lymph nodes in the neck are normal. RESPIRATORY:  Lungs are clear. There is normal respiratory effort, with equal breath sounds bilaterally, and without pathologic use of accessory muscles. CARDIOVASCULAR: Heart is irregular. GI: The abdomen is large, soft, minimally tender to palpation in the RUQ without a murphy's sign, and nondistended. There are no palpable masses. There is no hepatosplenomegaly. There are normal bowel sounds in all quadrants. GU: Rectal deferred.   MUSCULOSKELETAL: Normal muscle strength and tone. No cyanosis or edema.   SKIN: Turgor is good and there are no pathologic skin lesions or ulcers. NEUROLOGIC: Motor and sensation is grossly normal. Cranial nerves are grossly intact. PSYCH:  Oriented to person, place and time. Affect is normal.  Data Reviewed Images show multiple gallstones and a possibly thickened gallbladder wall on ultrasound. CT scan that was obtained 12 days ago did not show any evidence  of cholecystitis. Labs today do show a leukocytosis of 11.7 but this is improved from yesterday where showed 15.8. There are multiple electrolyte abnormalities including an elevated creatinine to 1.24 from 0.9 yesterday. I have personally reviewed the patient's imaging, laboratory findings and medical records.    Assessment    75 year old female with nausea and vomiting     Plan    Patient with a prolonged history of illness. Ultrasound read as possible acute  cholecystitis, but with completely normal liver function and not a convincing physical exam. Patient has had widely variable renal function over the past few weeks consistent with her history of multiple episodes of nausea and vomiting. Patient also with an irregular heart rate that had wide changes in rate during my history.  Plan for admission to the hospital for continued workup. Will ask for internal medicine and cardiology consultation. Patient may benefit from HIDA scan prior to any surgical intervention. Will keep NPO for now, IV hydration, PRN medications.      Time spent with the patient was 60 minutes, with more than 50% of the time spent in face-to-face education, counseling and care coordination.     Clayburn Pert, MD FACS General Surgeon 03/18/2015, 10:43 PM

## 2015-03-18 NOTE — ED Notes (Signed)
Pt to ed with c/o abd pain and vomiting x 3 weeks,  Was at u/s today and sent here from there for gallstones,  Pt reports no intake today.

## 2015-03-18 NOTE — Progress Notes (Signed)
PHARMACIST - PHYSICIAN ORDER COMMUNICATION  CONCERNING: P&T Medication Policy on Herbal Medications  DESCRIPTION:  This patient's order for:  melatonin  has been noted.  This product(s) is classified as an "herbal" or natural product. Due to a lack of definitive safety studies or FDA approval, nonstandard manufacturing practices, plus the potential risk of unknown drug-drug interactions while on inpatient medications, the Pharmacy and Therapeutics Committee does not permit the use of "herbal" or natural products of this type within [place].   ACTION TAKEN: The pharmacy department is unable to verify this order at this time. Please reevaluate patient's clinical condition at discharge and address if the herbal or natural product(s) should be resumed at that time.   

## 2015-03-18 NOTE — Telephone Encounter (Signed)
agree - concern for acute cholecystitis. Pt in hospital awaiting surgical consult.

## 2015-03-18 NOTE — ED Notes (Signed)
Pt placed on hospital bed. Dr Clearnce Hasten notified that surgery has not seen pt yet.

## 2015-03-18 NOTE — ED Notes (Signed)
Pt requesting pain medication, MD notified. See MAR.

## 2015-03-18 NOTE — Telephone Encounter (Addendum)
Called report from [place] at [facility] [place] for abd [place]. Dr Darnell Level spoke with pt and advised pt to go to ED at [facility]. I called [facility] ED ; spoke with [facility] charge nurse to be aware of pt's arrival.

## 2015-03-19 ENCOUNTER — Inpatient Hospital Stay: Payer: Medicare Other

## 2015-03-19 LAB — COMPREHENSIVE METABOLIC PANEL
ALBUMIN: 3.1 g/dL — AB (ref 3.5–5.0)
ALT: 14 U/L (ref 14–54)
AST: 22 U/L (ref 15–41)
Alkaline Phosphatase: 50 U/L (ref 38–126)
Anion gap: 7 (ref 5–15)
BUN: 22 mg/dL — ABNORMAL HIGH (ref 6–20)
CO2: 29 mmol/L (ref 22–32)
CREATININE: 1.31 mg/dL — AB (ref 0.44–1.00)
Calcium: 7.8 mg/dL — ABNORMAL LOW (ref 8.9–10.3)
Chloride: 105 mmol/L (ref 101–111)
GFR calc non Af Amer: 39 mL/min — ABNORMAL LOW (ref >60)
GFR, EST AFRICAN AMERICAN: 46 mL/min — AB (ref >60)
Glucose, Bld: 120 mg/dL — ABNORMAL HIGH (ref 65–99)
POTASSIUM: 3.4 mmol/L — AB (ref 3.5–5.1)
Sodium: 141 mmol/L (ref 135–145)
Total Bilirubin: 0.7 mg/dL (ref 0.3–1.2)
Total Protein: 6.3 g/dL — ABNORMAL LOW (ref 6.5–8.1)

## 2015-03-19 LAB — HEMOGLOBIN A1C: Hgb A1c MFr Bld: 6.1 % — ABNORMAL HIGH (ref 4.0–6.0)

## 2015-03-19 LAB — PHOSPHORUS: PHOSPHORUS: 3.3 mg/dL (ref 2.5–4.6)

## 2015-03-19 LAB — GLUCOSE, CAPILLARY
Glucose-Capillary: 106 mg/dL — ABNORMAL HIGH (ref 65–99)
Glucose-Capillary: 103 mg/dL — ABNORMAL HIGH (ref 65–99)
Glucose-Capillary: 114 mg/dL — ABNORMAL HIGH (ref 65–99)

## 2015-03-19 LAB — CBC
HEMATOCRIT: 32.5 % — AB (ref 35.0–47.0)
HEMOGLOBIN: 11.2 g/dL — AB (ref 12.0–16.0)
MCH: 32.1 pg (ref 26.0–34.0)
MCHC: 34.4 g/dL (ref 32.0–36.0)
MCV: 93.3 fL (ref 80.0–100.0)
Platelets: 138 10*3/uL — ABNORMAL LOW (ref 150–440)
RBC: 3.49 MIL/uL — AB (ref 3.80–5.20)
RDW: 14 % (ref 11.5–14.5)
WBC: 4.7 10*3/uL (ref 3.6–11.0)

## 2015-03-19 LAB — PROTIME-INR
INR: 1.17
PROTHROMBIN TIME: 15.1 s — AB (ref 11.4–15.0)

## 2015-03-19 LAB — MRSA PCR SCREENING: MRSA by PCR: NEGATIVE

## 2015-03-19 LAB — APTT: APTT: 34 s (ref 24–36)

## 2015-03-19 LAB — MAGNESIUM: Magnesium: 1.8 mg/dL (ref 1.7–2.4)

## 2015-03-19 MED ORDER — INSULIN ASPART 100 UNIT/ML ~~LOC~~ SOLN: 0.0000 [IU] | Freq: Three times a day (TID) | SUBCUTANEOUS | Status: DC

## 2015-03-19 MED ORDER — POTASSIUM CHLORIDE 20 MEQ/15ML (10%) PO SOLN
40.0000 meq | Freq: Once | ORAL | Status: AC
  Administered 2015-03-19: 40 meq via ORAL
  Filled 2015-03-19: qty 30

## 2015-03-19 MED ORDER — ACETAMINOPHEN 325 MG PO TABS
650.0000 mg | ORAL_TABLET | Freq: Four times a day (QID) | ORAL | Status: DC | PRN
  Administered 2015-03-19: 650 mg via ORAL
  Filled 2015-03-19: qty 2

## 2015-03-19 NOTE — Care Management (Signed)
Anticipate lap chole pending internal medicine/cardiology clearance

## 2015-03-19 NOTE — Consult Note (Signed)
[facility] Consultation Note  Patient ID: Lisa Osborne, MRN: [id number], DOB/AGE: 08-19-1940 75 y.o. Admit date: 03/18/2015   Date of Consult: 03/19/2015 Primary Physician: Ria Bush, MD Primary Cardiologist: None  Chief Complaint:  Chief Complaint  Patient presents with  . Abdominal Pain   Reason for Consult: palpitations with irregular heart beat and tachycardia  HPI: 75 y.o. female with the significant prolonged episodes of nausea vomiting and abdominal discomfort possibly consistent with the cholecystitis have been having some malignant hypertension dehydration shortness of breath as well as the some concerns of palpitations. The patient has been hydrated over the last 24-48 hours with improvements of these symptoms and is comfortable lying flat. Patient has no history of cardiovascular concerns and has had an EKG showing sinus tachycardia with pre-atrial contractions. This is a likely source of her palpitations. This has improved at this time and there is no current evidence of true angina and or concerns of congestive heart failure. The likelihood of the symptoms listed above are consistent with her illness and cholecystitis. Based on the information and no history of cardiovascular disease she is at low risk for sedation and surgical risk  Past Medical History  Diagnosis Date  . History of iron deficiency anemia   . Arthritis     left side lower back pain  . History of chicken pox   . Depression     severe in past  . Diabetes mellitus type 2, controlled ([place]) 2006    diet controlled  . GERD (gastroesophageal reflux disease)     bad if off PPI  . Seasonal allergies   . Hypothyroidism     mild  . Urine incontinence     mild, wears pad  . Unilateral deafnesses     left ear deaf, wears hearing aid right side  . Rosacea   . Anemia   . Abnormal EKG     incomplete RBBB  . Colon adenocarcinoma - rectosigmoid T3N1 (1/15) s/p lap LAR 04/14/2012     Status post laparoscopic lower anterior resection      Surgical History:  Past Surgical History  Procedure Laterality Date  . Total vaginal hysterectomy  1985    cancer cells on cervix  . Abdominoplasty  1996  . Incontinence surgery  1996  . Colonoscopy  08/2006    normal  . Facelift  05/2011  . Abdominal hysterectomy  1985    total  . Laparoscopic low anterior resection N/A 04/14/2012    colon cancer at rectosigmoid junction s/p rigid proctoscopy splenic flexure mobilization lysis of adhesion;  Surgeon: Adin Hector, MD;   . Colonoscopy  03/2013    2 polyps, mild diverticulosis, rpt 3 yrs [name])     Home Meds: Prior to Admission medications   Medication Sig Start Date End Date Taking? Authorizing Provider  Calcium Citrate-Vitamin D (CALCIUM CITRATE + D PO) Take 1 tablet by mouth daily.    Yes Historical Provider, MD  Coenzyme Q10 (COQ10) 200 MG CAPS Take 1 capsule by mouth daily.    Yes Historical Provider, MD  lansoprazole (PREVACID) 15 MG capsule Take one tablet daily 06/11/14  Yes Ria Bush, MD  levothyroxine (SYNTHROID, LEVOTHROID) 25 MCG tablet TAKE ONE TABLET BY MOUTH DAILY 06/14/14  Yes Ria Bush, MD  loratadine (CLARITIN) 10 MG tablet Take 10 mg by mouth daily as needed for allergies. Reported on 03/17/2015   Yes Historical Provider, MD  Melatonin 5 MG TABS Take 5 mg by mouth at  bedtime.    Yes Historical Provider, MD  Omega-3 Fatty Acids (FISH OIL) 1000 MG CAPS Take 1 capsule by mouth daily.   Yes Historical Provider, MD  ondansetron (ZOFRAN) 4 MG tablet Take 1 tablet (4 mg total) by mouth every 8 (eight) hours as needed for nausea or vomiting. 03/17/15  Yes Ria Bush, MD  polycarbophil (FIBERCON) 625 MG tablet Take 625 mg by mouth daily. Not sure of dose   Yes Historical Provider, MD  sertraline (ZOLOFT) 50 MG tablet Take 1 tablet (50 mg total) by mouth daily. 03/17/15  Yes Ria Bush, MD  vitamin B-12 (CYANOCOBALAMIN) 1000 MCG tablet Take 1,000 mcg  by mouth daily.   Yes Historical Provider, MD    Inpatient Medications:  . insulin aspart  0-9 Units Subcutaneous TID WC  . pantoprazole (PROTONIX) IV  40 mg Intravenous QHS  . piperacillin-tazobactam (ZOSYN)  IV  3.375 g Intravenous 3 times per day  . sertraline  50 mg Oral Daily   . lactated ringers 125 mL/hr at 03/19/15 1540    Allergies:  Allergies  Allergen Reactions  . Latex     Mild reaction  . Nickel     Slight itch  . Shrimp [Shellfish Allergy]     Slight allergy - itching    Social History   Social History  . Marital Status: Single    Spouse Name: N/A  . Number of Children: 1  . Years of Education: N/A   Occupational History  . Retired Pharmacist, hospital    Social History Main Topics  . Smoking status: Former Smoker    Quit date: 04/06/1967  . Smokeless tobacco: Not on file  . Alcohol Use: Yes     Comment: seldom  . Drug Use: No  . Sexual Activity: Not on file   Other Topics Concern  . Not on file   Social History Narrative   Caffeine: 1 cup coffee/day   Lives alone.  1 daughter [name]), 3 stillborns   Occupation: retired, was Chief Technology Officer.   Activity: no regular activity but does park far away, has treadmill at home   Diet: good fruits/vegetables, more fruit/vegetable smoothies  good amt water     Family History  Problem Relation Age of Onset  . Breast cancer Mother 1    breast cancer then multiple myeloma  . Cancer Mother     breast  . Alcohol abuse Father   . Heart disease Father     heart failure  . Mental illness Brother     2 brothers severe depression, suicide x2  . Diabetes Maternal Aunt   . Diabetes Maternal Uncle   . Diabetes Paternal Uncle   . Diabetes Paternal Grandfather   . Coronary artery disease Neg Hx   . Stroke Neg Hx      Review of Systems Positive for abdominal discomfort and nausea and vomiting Negative for: General:  chills, fever, night sweats or weight changes.  Cardiovascular: PND orthopnea syncope dizziness   Dermatological skin lesions rashes Respiratory: Cough congestion Urologic: Frequent urination urination at night and hematuria Abdominal: Positive for nausea, vomiting, negative for diarrhea, bright red blood per rectum, melena, or hematemesis Neurologic: negative for visual changes, and/or hearing changes  All other systems reviewed and are otherwise negative except as noted above.  Labs: No results for input(s): CKTOTAL, CKMB, TROPONINI in the last 72 hours. Lab Results  Component Value Date   WBC 4.7 03/19/2015   HGB 11.2* 03/19/2015   HCT 32.5* 03/19/2015  MCV 93.3 03/19/2015   PLT 138* 03/19/2015    Recent Labs Lab 03/19/15 0521  NA 141  K 3.4*  CL 105  CO2 29  BUN 22*  CREATININE 1.31*  CALCIUM 7.8*  PROT 6.3*  BILITOT 0.7  ALKPHOS 50  ALT 14  AST 22  GLUCOSE 120*   Lab Results  Component Value Date   CHOL 179 06/07/2014   HDL 43.50 06/07/2014   TRIG 216.0* 06/07/2014   No results found for: DDIMER  Radiology/Studies:  Ct Chest W Contrast  03/06/2015  CLINICAL DATA:  Colon cancer. EXAM: CT CHEST, ABDOMEN, AND PELVIS WITH CONTRAST TECHNIQUE: Multidetector CT imaging of the chest, abdomen and pelvis was performed following the standard protocol during bolus administration of intravenous contrast. CONTRAST:  50m OMNIPAQUE IOHEXOL 300 MG/ML  SOLN COMPARISON:  03/11/2014 FINDINGS: CT CHEST Mediastinum: The heart size appears normal. There is no pericardial effusion identified. The trachea appears patent and is midline. Small hiatal hernia identified. No mediastinal or hilar adenopathy. Lungs/Pleura: No pleural fluid identified. There is no airspace consolidation or atelectasis identified no suspicious pulmonary parenchymal nodule or mass identified Musculoskeletal: The bones appear osteopenic. No aggressive lytic or sclerotic bone lesions. CT ABDOMEN AND PELVIS Hepatobiliary: Within the lateral segment of the liver there is a 2.5 cm low-attenuation structure, image 47  of series 2. This is unchanged from previous exam. Likely cyst. Smaller adjacent cyst is also unchanged measuring 8 mm. Stable 7 mm right lobe of liver cyst, image 59 of series 2. No suspicious liver lesions identified. The gallbladder is normal. Pancreas: Normal appearance of the pancreas. Spleen: The spleen is unremarkable. Adrenals/Urinary Tract: Normal adrenal glands. Mild bilateral cortical scarring and volume loss. No obstructive uropathy. The urinary bladder appears normal. Stomach/Bowel: Small hiatal hernia. No pathologic dilatation of the small bowel loops. The colon also has a normal caliber. There are postoperative changes involving the sigmoid colon from previous colectomy. No findings to suggest local tumor recurrence. Vascular/Lymphatic: Calcified atherosclerotic disease involves the abdominal aorta. No aneurysm. No enlarged retroperitoneal or mesenteric adenopathy. No enlarged pelvic or inguinal lymph nodes. Reproductive: Previous hysterectomy.  No adnexal mass. Other: There is no ascites or focal fluid collections within the abdomen or pelvis. No peritoneal nodule or mass identified. Musculoskeletal: No aggressive lytic or sclerotic bone lesions. IMPRESSION: 1. No findings identified to suggest residual or recurrence of tumor within the chest, abdomen or pelvis. 2. Stable liver cysts 3. Hepatic steatosis 4. Aortic atherosclerosis. Electronically Signed   By: TKerby MoorsM.D.   On: 03/06/2015 10:09   UKoreaAbdomen Complete  03/18/2015  CLINICAL DATA:  Vomiting. EXAM: ABDOMEN ULTRASOUND COMPLETE COMPARISON:  CT 03/06/2015. FINDINGS: Gallbladder: Multiple gallstones, the largest measures 5 mm. Gallbladder wall is thickened at 3.6 mm. Negative Murphy sign. Questionable trace pericholecystic fluid present. Common bile duct: Diameter: 4.5 mm Liver: Liver is echogenic suggesting fatty infiltration and/or hepatocellular disease. Focal area of fatty sparing is most likely present. 2.7 cm and 1.0 cm cysts  noted in the central portion liver, similar findings noted on prior exam. IVC: No abnormality visualized. Pancreas: Visualized portion unremarkable. Spleen: Size and appearance within normal limits. Right Kidney: Length: 12.2 cm. Echogenicity within normal limits. No mass or hydronephrosis visualized. Left Kidney: Length: 11.0 cm. Echogenicity within normal limits. No mass or hydronephrosis visualized. Abdominal aorta: No aneurysm visualized. Other findings: None. IMPRESSION: 1. Multiple gallstones with thickened gallbladder wall and questionable small amount of pericholecystic fluid. Findings suggest cholelithiasis with possible cholecystitis. Hypoproteinemic states  can also result in gallbladder wall thickening. 2. Echogenic liver consistent with fatty infiltration and/or hepatocellular disease. Focal area of fatty sparing noted. Two simple cysts are again noted and appear stable. Electronically Signed   By: Marcello Moores  Register   On: 03/18/2015 09:13   Ct Abdomen Pelvis W Contrast  03/06/2015  CLINICAL DATA:  Colon cancer. EXAM: CT CHEST, ABDOMEN, AND PELVIS WITH CONTRAST TECHNIQUE: Multidetector CT imaging of the chest, abdomen and pelvis was performed following the standard protocol during bolus administration of intravenous contrast. CONTRAST:  55m OMNIPAQUE IOHEXOL 300 MG/ML  SOLN COMPARISON:  03/11/2014 FINDINGS: CT CHEST Mediastinum: The heart size appears normal. There is no pericardial effusion identified. The trachea appears patent and is midline. Small hiatal hernia identified. No mediastinal or hilar adenopathy. Lungs/Pleura: No pleural fluid identified. There is no airspace consolidation or atelectasis identified no suspicious pulmonary parenchymal nodule or mass identified Musculoskeletal: The bones appear osteopenic. No aggressive lytic or sclerotic bone lesions. CT ABDOMEN AND PELVIS Hepatobiliary: Within the lateral segment of the liver there is a 2.5 cm low-attenuation structure, image 47 of  series 2. This is unchanged from previous exam. Likely cyst. Smaller adjacent cyst is also unchanged measuring 8 mm. Stable 7 mm right lobe of liver cyst, image 59 of series 2. No suspicious liver lesions identified. The gallbladder is normal. Pancreas: Normal appearance of the pancreas. Spleen: The spleen is unremarkable. Adrenals/Urinary Tract: Normal adrenal glands. Mild bilateral cortical scarring and volume loss. No obstructive uropathy. The urinary bladder appears normal. Stomach/Bowel: Small hiatal hernia. No pathologic dilatation of the small bowel loops. The colon also has a normal caliber. There are postoperative changes involving the sigmoid colon from previous colectomy. No findings to suggest local tumor recurrence. Vascular/Lymphatic: Calcified atherosclerotic disease involves the abdominal aorta. No aneurysm. No enlarged retroperitoneal or mesenteric adenopathy. No enlarged pelvic or inguinal lymph nodes. Reproductive: Previous hysterectomy.  No adnexal mass. Other: There is no ascites or focal fluid collections within the abdomen or pelvis. No peritoneal nodule or mass identified. Musculoskeletal: No aggressive lytic or sclerotic bone lesions. IMPRESSION: 1. No findings identified to suggest residual or recurrence of tumor within the chest, abdomen or pelvis. 2. Stable liver cysts 3. Hepatic steatosis 4. Aortic atherosclerosis. Electronically Signed   By: TKerby MoorsM.D.   On: 03/06/2015 10:09   Dg Abd 2 Views  03/19/2015  CLINICAL DATA:  Right-sided abdominal pain and nausea and vomiting for 3 weeks with intermittent fevers and chills EXAM: ABDOMEN - 2 VIEW COMPARISON:  None. FINDINGS: Nonobstructive bowel gas pattern. No abnormal opacities over the abdomen or pelvis. Mild stool retained throughout the large bowel. IMPRESSION: Bowel gas pattern normal Electronically Signed   By: RSkipper ClicheM.D.   On: 03/19/2015 12:40    EKG: Sinus tachycardia with pre-atrial  contractions  Weights: Filed Weights   03/18/15 0943 03/18/15 2143  Weight: 235 lb (106.595 kg) 229 lb 11.2 oz (104.191 kg)     Physical Exam: Blood pressure 119/67, pulse 78, temperature 98.3 F (36.8 C), temperature source Oral, resp. rate 19, height 5' 9"  (1.753 m), weight 229 lb 11.2 oz (104.191 kg), SpO2 97 %. Body mass index is 33.91 kg/(m^2). General: Well developed, well nourished, in no acute distress. Head eyes ears nose throat: Normocephalic, atraumatic, sclera non-icteric, no xanthomas, nares are without discharge. No apparent thyromegaly and/or mass  Lungs: Normal respiratory effort.  no wheezes, no rales, no rhonchi.  Heart: RRR with normal S1 S2. no murmur gallop, no rub,  PMI is normal size and placement, carotid upstroke normal without bruit, jugular venous pressure is normal Abdomen: Soft,  Tender, distended with normoactive bowel sounds. No hepatomegaly. No rebound/guarding. No obvious abdominal masses. Abdominal aorta is normal size without bruit Extremities: No edema. no cyanosis, no clubbing, no ulcers  Peripheral : 2+ bilateral upper extremity pulses, 2+ bilateral femoral pulses, 2+ bilateral dorsal pedal pulse Neuro: Alert and oriented. No facial asymmetry. No focal deficit. Moves all extremities spontaneously. Musculoskeletal: Normal muscle tone without kyphosis Psych:  Responds to questions appropriately with a normal affect.    Assessment: 75 year old female with nausea vomiting and cholecystitis likely causing tachycardia hypertension palpitations with pre-atrial contractions now improved with hydration and antibiotics at low risk for cardiovascular complication with surgical intervention  Plan: 1. No further cardiac diagnostics necessary at this time due to improvements of palpitations and tachycardia 2. Proceed to cholecystectomy without restriction 3. No restrictions to ambulation and/or postsurgical care 4. Follow as outpatient for further recurrence of  symptoms and/or tachycardia  Signed, Corey Skains M.D. [facility] 03/19/2015, 6:11 PM

## 2015-03-19 NOTE — Progress Notes (Signed)
CC: Right upper quadrant abdominal pain Subjective: Patient's right upper quadrant abdominal pain is completely resolved all suggestive of biliary colic and not acute cholecystitis. She has no nausea or vomiting she is thirsty and wants to eat. His fevers or chills  Objective: Vital signs in last 24 hours: Temp:  [98.7 F (37.1 C)-100.8 F (38.2 C)] 98.7 F (37.1 C) (02/22 0757) Pulse Rate:  [68-112] 104 (02/22 0757) Resp:  [15-27] 18 (02/22 0757) BP: (94-131)/(49-79) 112/64 mmHg (02/22 0757) SpO2:  [90 %-99 %] 99 % (02/22 0757) Weight:  [229 lb 11.2 oz (104.191 kg)] 229 lb 11.2 oz (104.191 kg) (02/21 2143)    Intake/Output from previous day: 02/21 0701 - 02/22 0700 In: 925 [I.V.:925] Out: 200 [Urine:200] Intake/Output this shift: Total I/O In: 1780.3 [I.V.:1780.3] Out: 200 [Urine:200]  Physical exam:  Obese Soft nontender abdomen Nontender calves Jaundice no icterus  Lab Results: CBC   Recent Labs  03/18/15 0950 03/19/15 0521  WBC 11.7* 4.7  HGB 12.8 11.2*  HCT 37.7 32.5*  PLT 187 138*   BMET  Recent Labs  03/18/15 0950 03/19/15 0521  NA 136 141  K 3.7 3.4*  CL 100* 105  CO2 28 29  GLUCOSE 142* 120*  BUN 19 22*  CREATININE 1.24* 1.31*  CALCIUM 8.3* 7.8*   PT/INR  Recent Labs  03/19/15 0521  LABPROT 15.1*  INR 1.17   ABG No results for input(s): PHART, HCO3 in the last 72 hours.  Invalid input(s): PCO2, PO2  Studies/Results: US Abdomen Complete  03/18/2015  CLINICAL DATA:  Vomiting. EXAM: ABDOMEN ULTRASOUND COMPLETE COMPARISON:  CT 03/06/2015. FINDINGS: Gallbladder: Multiple gallstones, the largest measures 5 mm. Gallbladder wall is thickened at 3.6 mm. Negative Murphy sign. Questionable trace pericholecystic fluid present. Common bile duct: Diameter: 4.5 mm Liver: Liver is echogenic suggesting fatty infiltration and/or hepatocellular disease. Focal area of fatty sparing is most likely present. 2.7 cm and 1.0 cm cysts noted in the central  portion liver, similar findings noted on prior exam. IVC: No abnormality visualized. Pancreas: Visualized portion unremarkable. Spleen: Size and appearance within normal limits. Right Kidney: Length: 12.2 cm. Echogenicity within normal limits. No mass or hydronephrosis visualized. Left Kidney: Length: 11.0 cm. Echogenicity within normal limits. No mass or hydronephrosis visualized. Abdominal aorta: No aneurysm visualized. Other findings: None. IMPRESSION: 1. Multiple gallstones with thickened gallbladder wall and questionable small amount of pericholecystic fluid. Findings suggest cholelithiasis with possible cholecystitis. Hypoproteinemic states can also result in gallbladder wall thickening. 2. Echogenic liver consistent with fatty infiltration and/or hepatocellular disease. Focal area of fatty sparing noted. Two simple cysts are again noted and appear stable. Electronically Signed   By: Marcello Moores  Register   On: 03/18/2015 09:13   Dg Abd 2 Views  03/19/2015  CLINICAL DATA:  Right-sided abdominal pain and nausea and vomiting for 3 weeks with intermittent fevers and chills EXAM: ABDOMEN - 2 VIEW COMPARISON:  None. FINDINGS: Nonobstructive bowel gas pattern. No abnormal opacities over the abdomen or pelvis. Mild stool retained throughout the large bowel. IMPRESSION: Bowel gas pattern normal Electronically Signed   By: Skipper Cliche M.D.   On: 03/19/2015 12:40    Anti-infectives: Anti-infectives    Start     Dose/Rate Route Frequency Ordered Stop   03/18/15 2230  piperacillin-tazobactam (ZOSYN) IVPB 3.375 g     3.375 g 12.5 mL/hr over 240 Minutes Intravenous 3 times per day 03/18/15 2216     03/18/15 1130  ampicillin-sulbactam (UNASYN) 1.5 g in sodium chloride 0.9 %  50 mL IVPB     1.5 g 100 mL/hr over 30 Minutes Intravenous  Once 03/18/15 1057 03/18/15 1213      Assessment/Plan:  Labs reviewed Appreciate prime doc's assistance and awaiting cardiology consult at this point. Patient does not have  acute cholecystitis but likely has recurrent biliary colic symptoms we'll plan surgical intervention once able providing cardiology is in agreement with a general anesthetic.  Florene Glen, MD, FACS  03/19/2015

## 2015-03-19 NOTE — Consult Note (Signed)
Medical Consultation  Lisa Osborne FIE:[id number] DOB: October 04, 1940 DOA: 03/18/2015 PCP: Ria Bush, MD   Requesting physician: Dr Adonis Huguenin Date of consultation: 03/19/2015 Reason for consultation: Nausea and vomiting  Impression/Recommendations  75 year old female with a history of nausea and vomiting for the past 3 weeks. Hospital service was consulted for evaluation.  1. Nausea and vomiting: Patient reports her symptoms have improved. I suspect this is gallbladder related. Patient has acute cholecystitis and is on antibiotics which should be continued. HIDA scan as per surgery. Continue supportive management. Continue PPI  The patient continued to have nausea and vomiting after etiology of gallbladder disease has been completely ruled out postsurgery then would recommend GI consultation for possible EGD. 2. Acute kidney injury: Due to poor by mouth intake and dehydration Hold nephrotoxic agents and continue IV fluids. Repeat BMP in a.m.  3. Controlled diabetes type 2: Check hemoglobin A1c. Continue sliding scale insulin. 4. Hypokalemia: Potassium will be repleted.   Chief Complaint: Nausea and vomiting  HPI:  Very pleasant 75 year old female with a history of diabetes who presents with 3 weeks of nausea and vomiting. Patient reports that she had a chicken salad and after that she felt nauseous and had vomiting episodes. She saw her primary care physician yesterday who recommended the patient go to the ER for further evaluation. Right upper: Ultrasound shows acute cholecystitis as per surgery. She is started on Zosyn. She reports that her nausea and vomiting has much improved since starting in a bounce.   Review of Systems  Constitutional: Negative for fever, chills weight loss HENT: Negative for ear pain, nosebleeds, congestion, facial swelling, rhinorrhea, neck pain, neck stiffness and ear discharge.   Respiratory: Negative for cough, shortness of breath, wheezing   Cardiovascular: Negative for chest pain, palpitations and leg swelling.  Gastrointestinal: Negative for heartburn, abdominal pain, ++nausea and vomiting, NOdiarrhea or consitpation Genitourinary: Negative for dysuria, urgency, frequency, hematuria Musculoskeletal: Negative for back pain or joint pain Neurological: Negative for dizziness, seizures, syncope, focal weakness,  numbness and headaches.  Hematological: Does not bruise/bleed easily.  Psychiatric/Behavioral: Negative for hallucinations, confusion, dysphoric mood   Past Medical History  Diagnosis Date  . History of iron deficiency anemia   . Arthritis     left side lower back pain  . History of chicken pox   . Depression     severe in past  . Diabetes mellitus type 2, controlled ([place]) 2006    diet controlled  . GERD (gastroesophageal reflux disease)     bad if off PPI  . Seasonal allergies   . Hypothyroidism     mild  . Urine incontinence     mild, wears pad  . Unilateral deafnesses     left ear deaf, wears hearing aid right side  . Rosacea   . Anemia   . Abnormal EKG     incomplete RBBB  . Colon adenocarcinoma - rectosigmoid T3N1 (1/15) s/p lap LAR 04/14/2012    Status post laparoscopic lower anterior resection   Past Surgical History  Procedure Laterality Date  . Total vaginal hysterectomy  1985    cancer cells on cervix  . Abdominoplasty  1996  . Incontinence surgery  1996  . Colonoscopy  08/2006    normal  . Facelift  05/2011  . Abdominal hysterectomy  1985    total  . Laparoscopic low anterior resection N/A 04/14/2012    colon cancer at rectosigmoid junction s/p rigid proctoscopy splenic flexure mobilization lysis of adhesion;  Surgeon: Revonda Standard.  Gross, MD;   . Colonoscopy  03/2013    2 polyps, mild diverticulosis, rpt 3 yrs [name])   Social History: No EtOH, tobacco or IV drug use.  Allergies  Allergen Reactions  . Latex     Mild reaction  . Nickel     Slight itch  . Shrimp [Shellfish Allergy]      Slight allergy - itching   Family History  Problem Relation Age of Onset  . Breast cancer Mother 84    breast cancer then multiple myeloma  . Cancer Mother     breast  . Alcohol abuse Father   . Heart disease Father     heart failure  . Mental illness Brother     2 brothers severe depression, suicide x2  . Diabetes Maternal Aunt   . Diabetes Maternal Uncle   . Diabetes Paternal Uncle   . Diabetes Paternal Grandfather   . Coronary artery disease Neg Hx   . Stroke Neg Hx     Prior to Admission medications   Medication Sig Start Date End Date Taking? Authorizing Provider  Calcium Citrate-Vitamin D (CALCIUM CITRATE + D PO) Take 1 tablet by mouth daily.    Yes Historical Provider, MD  Coenzyme Q10 (COQ10) 200 MG CAPS Take 1 capsule by mouth daily.    Yes Historical Provider, MD  lansoprazole (PREVACID) 15 MG capsule Take one tablet daily 06/11/14  Yes Ria Bush, MD  levothyroxine (SYNTHROID, LEVOTHROID) 25 MCG tablet TAKE ONE TABLET BY MOUTH DAILY 06/14/14  Yes Ria Bush, MD  loratadine (CLARITIN) 10 MG tablet Take 10 mg by mouth daily as needed for allergies. Reported on 03/17/2015   Yes Historical Provider, MD  Melatonin 5 MG TABS Take 5 mg by mouth at bedtime.    Yes Historical Provider, MD  Omega-3 Fatty Acids (FISH OIL) 1000 MG CAPS Take 1 capsule by mouth daily.   Yes Historical Provider, MD  ondansetron (ZOFRAN) 4 MG tablet Take 1 tablet (4 mg total) by mouth every 8 (eight) hours as needed for nausea or vomiting. 03/17/15  Yes Ria Bush, MD  polycarbophil (FIBERCON) 625 MG tablet Take 625 mg by mouth daily. Not sure of dose   Yes Historical Provider, MD  sertraline (ZOLOFT) 50 MG tablet Take 1 tablet (50 mg total) by mouth daily. 03/17/15  Yes Ria Bush, MD  vitamin B-12 (CYANOCOBALAMIN) 1000 MCG tablet Take 1,000 mcg by mouth daily.   Yes Historical Provider, MD    Physical Exam: Blood pressure 112/64, pulse 104, temperature 98.7 F (37.1 C),  temperature source Oral, resp. rate 18, height 5' 9"  (1.753 m), weight 104.191 kg (229 lb 11.2 oz), SpO2 99 %. @VITALS2 @ [employer]   03/18/15 0943 03/18/15 2143  Weight: 106.595 kg (235 lb) 104.191 kg (229 lb 11.2 oz)    Intake/Output Summary (Last 24 hours) at 03/19/15 1113 Last data filed at 03/19/15 0935  Gross per 24 hour  Intake 2242.3 ml  Output    400 ml  Net 1842.3 ml     Constitutional: Appears well-developed and well-nourished. No distress. HENT: Normocephalic. Marland Kitchen Oropharynx is clear and moist.  Eyes: Conjunctivae and EOM are normal. PERRLA, no scleral icterus.  Neck: Normal ROM. Neck supple. No JVD. No tracheal deviation. CVS: RRR, S1/S2 +, no murmurs, no gallops, no carotid bruit.  Pulmonary: Effort and breath sounds normal, no stridor, rhonchi, wheezes, rales.  Abdominal: Soft. BS +,  no distension, tenderness, rebound or guarding.  Musculoskeletal: Normal range of motion. No  edema and no tenderness.  Neuro: Alert. CN 2-12 grossly intact. No focal deficits. Skin: Skin is warm and dry. No rash noted. Psychiatric: Normal mood and affect.    Labs  Basic Metabolic Panel:  Recent Labs Lab 03/19/15 0521  NA 141  K 3.4*  CL 105  CO2 29  GLUCOSE 120*  BUN 22*  CREATININE 1.31*  CALCIUM 7.8*  MG 1.8  PHOS 3.3   Liver Function Tests:  Recent Labs Lab 03/19/15 0521  AST 22  ALT 14  ALKPHOS 50  BILITOT 0.7  PROT 6.3*  ALBUMIN 3.1*    Recent Labs Lab 03/18/15 0950  LIPASE 19    CBC:  Recent Labs Lab 03/17/15 1403  03/19/15 0521  WBC 15.8*  < > 4.7  NEUTROABS 14.7*  --   --   HGB 13.7  < > 11.2*  HCT 40.8  < > 32.5*  MCV 92.9  < > 93.3  PLT 235.0  < > 138*  < > = values in this interval not displayed. Cardiac Enzymes: No results for input(s): CKTOTAL, CKMB, CKMBINDEX, TROPONINI in the last 168 hours. BNP: Invalid input(s): POCBNP CBG: No results for input(s): GLUCAP in the last 168 hours.  Radiological Exams: US Abdomen  Complete  03/18/2015  CLINICAL DATA:  Vomiting. EXAM: ABDOMEN ULTRASOUND COMPLETE COMPARISON:  CT 03/06/2015. FINDINGS: Gallbladder: Multiple gallstones, the largest measures 5 mm. Gallbladder wall is thickened at 3.6 mm. Negative Murphy sign. Questionable trace pericholecystic fluid present. Common bile duct: Diameter: 4.5 mm Liver: Liver is echogenic suggesting fatty infiltration and/or hepatocellular disease. Focal area of fatty sparing is most likely present. 2.7 cm and 1.0 cm cysts noted in the central portion liver, similar findings noted on prior exam. IVC: No abnormality visualized. Pancreas: Visualized portion unremarkable. Spleen: Size and appearance within normal limits. Right Kidney: Length: 12.2 cm. Echogenicity within normal limits. No mass or hydronephrosis visualized. Left Kidney: Length: 11.0 cm. Echogenicity within normal limits. No mass or hydronephrosis visualized. Abdominal aorta: No aneurysm visualized. Other findings: None. IMPRESSION: 1. Multiple gallstones with thickened gallbladder wall and questionable small amount of pericholecystic fluid. Findings suggest cholelithiasis with possible cholecystitis. Hypoproteinemic states can also result in gallbladder wall thickening. 2. Echogenic liver consistent with fatty infiltration and/or hepatocellular disease. Focal area of fatty sparing noted. Two simple cysts are again noted and appear stable. Electronically Signed   By: Marcello Moores  Register   On: 03/18/2015 09:13       Thank you for allowing me to participate in the care of your patient. We will continue to follow.   Note: This dictation was prepared with Dragon dictation along with smaller phrase technology. Any transcriptional errors that result from this process are unintentional.  Time spent: 45 minutes  Rowena Moilanen, MD

## 2015-03-20 ENCOUNTER — Encounter
Admission: EM | Disposition: A | Discharge status: Home / Self Care | Payer: Self-pay | Source: Home / Self Care | Attending: General Surgery

## 2015-03-20 ENCOUNTER — Inpatient Hospital Stay: Payer: Medicare Other | Admitting: Anesthesiology

## 2015-03-20 DIAGNOSIS — K810 Acute cholecystitis: Secondary | ICD-10-CM

## 2015-03-20 HISTORY — PX: CHOLECYSTECTOMY: SHX55

## 2015-03-20 LAB — BASIC METABOLIC PANEL
ANION GAP: 9 (ref 5–15)
BUN: 18 mg/dL (ref 6–20)
CALCIUM: 8.6 mg/dL — AB (ref 8.9–10.3)
CO2: 28 mmol/L (ref 22–32)
Chloride: 105 mmol/L (ref 101–111)
Creatinine, Ser: 1.17 mg/dL — ABNORMAL HIGH (ref 0.44–1.00)
GFR calc Af Amer: 52 mL/min — ABNORMAL LOW (ref >60)
GFR, EST NON AFRICAN AMERICAN: 45 mL/min — AB (ref >60)
GLUCOSE: 107 mg/dL — AB (ref 65–99)
Potassium: 3.6 mmol/L (ref 3.5–5.1)
SODIUM: 142 mmol/L (ref 135–145)

## 2015-03-20 LAB — GLUCOSE, CAPILLARY
GLUCOSE-CAPILLARY: 90 mg/dL (ref 65–99)
Glucose-Capillary: 100 mg/dL — ABNORMAL HIGH (ref 65–99)
Glucose-Capillary: 105 mg/dL — ABNORMAL HIGH (ref 65–99)
Glucose-Capillary: 146 mg/dL — ABNORMAL HIGH (ref 65–99)

## 2015-03-20 SURGERY — LAPAROSCOPIC CHOLECYSTECTOMY
Anesthesia: General | Wound class: Clean Contaminated

## 2015-03-20 MED ORDER — ONDANSETRON HCL 4 MG/2ML IJ SOLN
INTRAMUSCULAR | Status: DC | PRN
  Administered 2015-03-20: 4 mg via INTRAVENOUS

## 2015-03-20 MED ORDER — ONDANSETRON HCL 4 MG/2ML IJ SOLN
INTRAMUSCULAR | Status: AC
  Administered 2015-03-20: 4 mg via INTRAVENOUS
  Filled 2015-03-20: qty 2

## 2015-03-20 MED ORDER — GLYCOPYRROLATE 0.2 MG/ML IJ SOLN
INTRAMUSCULAR | Status: DC | PRN
  Administered 2015-03-20 (×2): 0.2 mg via INTRAVENOUS

## 2015-03-20 MED ORDER — ONDANSETRON HCL 4 MG/2ML IJ SOLN
4.0000 mg | Freq: Once | INTRAMUSCULAR | Status: AC | PRN
  Administered 2015-03-20: 4 mg via INTRAVENOUS

## 2015-03-20 MED ORDER — MIDAZOLAM HCL 5 MG/5ML IJ SOLN
INTRAMUSCULAR | Status: DC | PRN
  Administered 2015-03-20: 2 mg via INTRAVENOUS

## 2015-03-20 MED ORDER — PROPOFOL 10 MG/ML IV BOLUS
INTRAVENOUS | Status: DC | PRN
  Administered 2015-03-20: 30 mg via INTRAVENOUS
  Administered 2015-03-20: 170 mg via INTRAVENOUS

## 2015-03-20 MED ORDER — HYDROCODONE-ACETAMINOPHEN 5-325 MG PO TABS: 1.0000 | ORAL_TABLET | ORAL | Status: DC | PRN

## 2015-03-20 MED ORDER — NEOSTIGMINE METHYLSULFATE 10 MG/10ML IV SOLN
INTRAVENOUS | Status: DC | PRN
  Administered 2015-03-20: 1 mg via INTRAVENOUS

## 2015-03-20 MED ORDER — LIDOCAINE HCL (CARDIAC) 20 MG/ML IV SOLN
INTRAVENOUS | Status: DC | PRN
  Administered 2015-03-20: 60 mg via INTRAVENOUS

## 2015-03-20 MED ORDER — ROCURONIUM BROMIDE 100 MG/10ML IV SOLN
INTRAVENOUS | Status: DC | PRN
Start: 2015-03-20 — End: 2015-03-20
  Administered 2015-03-20: 5 mg via INTRAVENOUS

## 2015-03-20 MED ORDER — DEXAMETHASONE SODIUM PHOSPHATE 10 MG/ML IJ SOLN
INTRAMUSCULAR | Status: DC | PRN
  Administered 2015-03-20: 5 mg via INTRAVENOUS

## 2015-03-20 MED ORDER — BUPIVACAINE-EPINEPHRINE (PF) 0.25% -1:200000 IJ SOLN
INTRAMUSCULAR | Status: DC | PRN
  Administered 2015-03-20: 30 mL

## 2015-03-20 MED ORDER — FENTANYL CITRATE (PF) 100 MCG/2ML IJ SOLN: 25.0000 ug | INTRAMUSCULAR | Status: DC | PRN

## 2015-03-20 MED ORDER — BUPIVACAINE-EPINEPHRINE (PF) 0.25% -1:200000 IJ SOLN
INTRAMUSCULAR | Status: AC
  Filled 2015-03-20: qty 30

## 2015-03-20 MED ORDER — EPHEDRINE SULFATE 50 MG/ML IJ SOLN
INTRAMUSCULAR | Status: DC | PRN
  Administered 2015-03-20: 5 mg via INTRAVENOUS

## 2015-03-20 MED ORDER — FENTANYL CITRATE (PF) 100 MCG/2ML IJ SOLN
INTRAMUSCULAR | Status: DC | PRN
  Administered 2015-03-20: 50 ug via INTRAVENOUS
  Administered 2015-03-20: 100 ug via INTRAVENOUS

## 2015-03-20 MED ORDER — SUCCINYLCHOLINE CHLORIDE 20 MG/ML IJ SOLN
INTRAMUSCULAR | Status: DC | PRN
  Administered 2015-03-20: 100 mg via INTRAVENOUS

## 2015-03-20 SURGICAL SUPPLY — 46 items
ADHESIVE MASTISOL STRL (MISCELLANEOUS) ×3 IMPLANT
APPLIER CLIP ROT 10 11.4 M/L (STAPLE) ×3
APPLIER CLIP ROT 13.4 12 LRG (CLIP) ×3
BLADE SURG SZ11 CARB STEEL (BLADE) ×3 IMPLANT
CANISTER SUCT 1200ML W/VALVE (MISCELLANEOUS) ×3 IMPLANT
CATH CHOLANGI 4FR 420404F (CATHETERS) IMPLANT
CHLORAPREP W/TINT 26ML (MISCELLANEOUS) ×3 IMPLANT
CLIP APPLIE ROT 10 11.4 M/L (STAPLE) ×1 IMPLANT
CLIP APPLIE ROT 13.4 12 LRG (CLIP) ×1 IMPLANT
CLOSURE WOUND 1/2 X4 (GAUZE/BANDAGES/DRESSINGS) ×1
CONRAY 60ML FOR OR (MISCELLANEOUS) IMPLANT
DRAPE C-ARM XRAY 36X54 (DRAPES) IMPLANT
ELECT REM PT RETURN 9FT ADLT (ELECTROSURGICAL) ×3
ELECTRODE REM PT RTRN 9FT ADLT (ELECTROSURGICAL) ×1 IMPLANT
ENDOPOUCH RETRIEVER 10 (MISCELLANEOUS) ×3 IMPLANT
GAUZE SPONGE NON-WVN 2X2 STRL (MISCELLANEOUS) ×4 IMPLANT
GLOVE BIO SURGEON STRL SZ8 (GLOVE) ×3 IMPLANT
GOWN STRL REUS W/ TWL LRG LVL3 (GOWN DISPOSABLE) ×4 IMPLANT
GOWN STRL REUS W/TWL LRG LVL3 (GOWN DISPOSABLE) ×8
IRRIGATION STRYKERFLOW (MISCELLANEOUS) IMPLANT
IRRIGATOR STRYKERFLOW (MISCELLANEOUS)
IV CATH ANGIO 12GX3 LT BLUE (NEEDLE) IMPLANT
IV NS 1000ML (IV SOLUTION)
IV NS 1000ML BAXH (IV SOLUTION) IMPLANT
JACKSON PRATT 10 (INSTRUMENTS) ×3 IMPLANT
KIT RM TURNOVER STRD PROC AR (KITS) ×3 IMPLANT
LABEL OR SOLS (LABEL) ×3 IMPLANT
NDL SAFETY 22GX1.5 (NEEDLE) ×3 IMPLANT
NEEDLE VERESS 14GA 120MM (NEEDLE) ×3 IMPLANT
NS IRRIG 500ML POUR BTL (IV SOLUTION) ×3 IMPLANT
PACK LAP CHOLECYSTECTOMY (MISCELLANEOUS) ×3 IMPLANT
SCISSORS METZENBAUM CVD 33 (INSTRUMENTS) ×3 IMPLANT
SLEEVE ENDOPATH XCEL 5M (ENDOMECHANICALS) ×6 IMPLANT
SPONGE EXCIL AMD DRAIN 4X4 6P (MISCELLANEOUS) IMPLANT
SPONGE LAP 18X18 5 PK (GAUZE/BANDAGES/DRESSINGS) ×3 IMPLANT
SPONGE VERSALON 2X2 STRL (MISCELLANEOUS) ×8
STRIP CLOSURE SKIN 1/2X4 (GAUZE/BANDAGES/DRESSINGS) ×2 IMPLANT
SUT MNCRL 4-0 (SUTURE) ×2
SUT MNCRL 4-0 27XMFL (SUTURE) ×1
SUT VICRYL 0 AB UR-6 (SUTURE) ×3 IMPLANT
SUTURE MNCRL 4-0 27XMF (SUTURE) ×1 IMPLANT
SYR 20CC LL (SYRINGE) ×3 IMPLANT
TROCAR BLADELESS 15MM (ENDOMECHANICALS) ×3 IMPLANT
TROCAR XCEL NON-BLD 11X100MML (ENDOMECHANICALS) ×3 IMPLANT
TROCAR XCEL NON-BLD 5MMX100MML (ENDOMECHANICALS) ×3 IMPLANT
TUBING INSUFFLATOR HI FLOW (MISCELLANEOUS) ×3 IMPLANT

## 2015-03-20 NOTE — Op Note (Signed)
Laparoscopic Cholecystectomy  Pre-operative Diagnosis: Biliary colic  Post-operative Diagnosis: Acute cholecystitis  Procedure: Laparoscopic cholecystectomy  Surgeon: Jerrol Banana. Burt Knack, MD FACS  Anesthesia: Gen. with endotracheal tube  Assistant: Surgical tech  Procedure Details  The patient was seen again in the Holding Room. The benefits, complications, treatment options, and expected outcomes were discussed with the patient. The risks of bleeding, infection, recurrence of symptoms, failure to resolve symptoms, bile duct damage, bile duct leak, retained common bile duct stone, bowel injury, any of which could require further surgery and/or ERCP, stent, or papillotomy were reviewed with the patient. The likelihood of improving the patient's symptoms with return to their baseline status is good.  The patient and/or family concurred with the proposed plan, giving informed consent.  The patient was taken to Operating Room, identified as Lisa Osborne and the procedure verified as Laparoscopic Cholecystectomy.  A Time Out was held and the above information confirmed.  Prior to the induction of general anesthesia, antibiotic prophylaxis was administered. VTE prophylaxis was in place. General endotracheal anesthesia was then administered and tolerated well. After the induction, the abdomen was prepped with Chloraprep and draped in the sterile fashion. The patient was positioned in the supine position.  Local anesthetic  was injected into the skin near the umbilicus and an incision made. The Veress needle was placed. Pneumoperitoneum was then created with CO2 and tolerated well without any adverse changes in the patient's vital signs. A 25mm port was placed in the periumbilical position and the abdominal cavity was explored.  Two 5-mm ports were placed in the right upper quadrant and a 12 mm epigastric port was placed all under direct vision. All skin incisions  were infiltrated with a local  anesthetic agent before making the incision and placing the trocars.   The patient was positioned  in reverse Trendelenburg, tilted slightly to the patient's left.  The gallbladder was identified, the fundus grasped and retracted cephalad. Adhesions were lysed bluntly. The infundibulum was grasped and retracted laterally, exposing the peritoneum overlying the triangle of Calot. The entire gallbladder appeared acutely inflamed and thickened but very fibrotic as well. This was then divided and exposed in a blunt fashion. A critical view of the cystic duct and cystic artery was obtained.  The cystic duct was clearly identified and bluntly dissected.   The cystic duct was then doubly clipped and divided the cystic artery is doubly clipped and divided. (The large size of this cystic duct at the infundibulum require replacement of the 12 port with a 15 mm port and utilization of a large clip was (  The gallbladder was taken from the gallbladder fossa in a retrograde fashion with the electrocautery. The gallbladder was removed and placed in an Endocatch bag. The liver bed was irrigated and inspected. Hemostasis was achieved with the electrocautery. Copious irrigation was utilized and was repeatedly aspirated until clear.  The gallbladder and Endocatch sac were then removed through the epigastric port site.   A 10 mm JP drain was placed in the foramen of Winslow and brought out through the lateral port site and held in with a 3-0 nylon  Inspection of the right upper quadrant was performed. No bleeding, bile duct injury or leak, or bowel injury was noted. Pneumoperitoneum was released.  The epigastric port site was closed with figure-of-eight 0 Vicryl sutures. 4-0 subcuticular Monocryl was used to close the skin. Steristrips and Mastisol and sterile dressings were  applied.  The patient was then extubated and brought to  the recovery room in stable condition. Sponge, lap, and needle counts were correct at closure  and at the conclusion of the case.  Drain was placed to bulb suction Findings: Acute and chronic Cholecystitis with a very cirrhous or fibrotic appearance and condition. Suggestive of possible carcinoma.  Estimated Blood Loss: 50 cc         Drains: JP 1         Specimens: Gallbladder           Complications: none               Ted Goodner E. Burt Knack, MD, FACS

## 2015-03-20 NOTE — Progress Notes (Signed)
CC: Right upper quadrant pain Subjective: Pain is gone at this point she has no nausea vomiting no fevers or chills but she has been a victim of recurrent episodic right upper quadrant epigastric pain associated fatty food intolerance. Her workup and shown gallstones and she is been cleared for surgery by cardiology and internal medicine  Objective: Vital signs in last 24 hours: Temp:  [97.8 F (36.6 C)-100.3 F (37.9 C)] 97.8 F (36.6 C) (02/23 0507) Pulse Rate:  [58-80] 58 (02/23 0507) Resp:  [18-19] 18 (02/23 0507) BP: (119-147)/(52-67) 147/56 mmHg (02/23 0507) SpO2:  [94 %-97 %] 97 % (02/23 0507)    Intake/Output from previous day: 02/22 0701 - 02/23 0700 In: 3189.8 [P.O.:360; I.V.:2804.8; IV Piggyback:25] Out: [id number] [Urine:650] Intake/Output this shift:    Physical exam:  No scleral icterus she appears comfortable vital signs are stable abdomen is soft and nontender calves are nontender no jaundice  Lab Results: CBC   Recent Labs  03/18/15 0950 03/19/15 0521  WBC 11.7* 4.7  HGB 12.8 11.2*  HCT 37.7 32.5*  PLT 187 138*   BMET  Recent Labs  03/19/15 0521 03/20/15 0507  NA 141 142  K 3.4* 3.6  CL 105 105  CO2 29 28  GLUCOSE 120* 107*  BUN 22* 18  CREATININE 1.31* 1.17*  CALCIUM 7.8* 8.6*   PT/INR  Recent Labs  03/19/15 0521  LABPROT 15.1*  INR 1.17   ABG No results for input(s): PHART, HCO3 in the last 72 hours.  Invalid input(s): PCO2, PO2  Studies/Results: US Abdomen Complete  03/18/2015  CLINICAL DATA:  Vomiting. EXAM: ABDOMEN ULTRASOUND COMPLETE COMPARISON:  CT 03/06/2015. FINDINGS: Gallbladder: Multiple gallstones, the largest measures 5 mm. Gallbladder wall is thickened at 3.6 mm. Negative Murphy sign. Questionable trace pericholecystic fluid present. Common bile duct: Diameter: 4.5 mm Liver: Liver is echogenic suggesting fatty infiltration and/or hepatocellular disease. Focal area of fatty sparing is most likely present. 2.7 cm and 1.0 cm  cysts noted in the central portion liver, similar findings noted on prior exam. IVC: No abnormality visualized. Pancreas: Visualized portion unremarkable. Spleen: Size and appearance within normal limits. Right Kidney: Length: 12.2 cm. Echogenicity within normal limits. No mass or hydronephrosis visualized. Left Kidney: Length: 11.0 cm. Echogenicity within normal limits. No mass or hydronephrosis visualized. Abdominal aorta: No aneurysm visualized. Other findings: None. IMPRESSION: 1. Multiple gallstones with thickened gallbladder wall and questionable small amount of pericholecystic fluid. Findings suggest cholelithiasis with possible cholecystitis. Hypoproteinemic states can also result in gallbladder wall thickening. 2. Echogenic liver consistent with fatty infiltration and/or hepatocellular disease. Focal area of fatty sparing noted. Two simple cysts are again noted and appear stable. Electronically Signed   By: Marcello Moores  Register   On: 03/18/2015 09:13   Dg Abd 2 Views  03/19/2015  CLINICAL DATA:  Right-sided abdominal pain and nausea and vomiting for 3 weeks with intermittent fevers and chills EXAM: ABDOMEN - 2 VIEW COMPARISON:  None. FINDINGS: Nonobstructive bowel gas pattern. No abnormal opacities over the abdomen or pelvis. Mild stool retained throughout the large bowel. IMPRESSION: Bowel gas pattern normal Electronically Signed   By: Skipper Cliche M.D.   On: 03/19/2015 12:40    Anti-infectives: Anti-infectives    Start     Dose/Rate Route Frequency Ordered Stop   03/18/15 2230  piperacillin-tazobactam (ZOSYN) IVPB 3.375 g     3.375 g 12.5 mL/hr over 240 Minutes Intravenous 3 times per day 03/18/15 2216     03/18/15 1130  ampicillin-sulbactam (UNASYN) 1.5  g in sodium chloride 0.9 % 50 mL IVPB     1.5 g 100 mL/hr over 30 Minutes Intravenous  Once 03/18/15 1057 03/18/15 1213      Assessment/Plan:  This patient is experiencing recurrent and episodic right upper quadrant pain that the workup  suggests pericolic symptoms she is diabetic I recommended laparoscopic cholecystectomy for control of her symptoms the options of observation of been reviewed the risks of bleeding infection recurrence of symptoms failure to resolve her symptoms open procedure bile duct damage bile duct leak retained common bile duct stone any of which could require further surgery and/or ERCP stent and papillotomy were all discussed with her she understood and agreed to proceed  Florene Glen, MD, FACS  03/20/2015

## 2015-03-20 NOTE — Progress Notes (Signed)
[place] [facility] [place] NAME: Escarleth Gowell    MR#:  [id number]  DATE OF BIRTH:  October 17, 1940  SUBJECTIVE:   Patient doing well this morning. No nausea or vomiting. Patient is planned for cholecystectomy today.  REVIEW OF SYSTEMS:    Review of Systems  Constitutional: Negative for fever, chills and malaise/fatigue.  HENT: Negative for sore throat.   Eyes: Negative for blurred vision.  Respiratory: Negative for cough, hemoptysis, shortness of breath and wheezing.   Cardiovascular: Negative for chest pain, palpitations and leg swelling.  Gastrointestinal: Negative for nausea, vomiting, abdominal pain, diarrhea and blood in stool.  Genitourinary: Negative for dysuria.  Musculoskeletal: Negative for back pain.  Neurological: Negative for dizziness, tremors and headaches.  Endo/Heme/Allergies: Does not bruise/bleed easily.    Tolerating Diet: Nothing by mouth      DRUG ALLERGIES:   Allergies  Allergen Reactions  . Latex     Mild reaction  . Nickel     Slight itch  . Shrimp [Shellfish Allergy]     Slight allergy - itching    VITALS:  Blood pressure 147/56, pulse 58, temperature 97.8 F (36.6 C), temperature source Oral, resp. rate 18, height 5\' 9"  (1.753 m), weight 104.191 kg (229 lb 11.2 oz), SpO2 97 %.  PHYSICAL EXAMINATION:   Physical Exam    LABORATORY PANEL:   CBC  Recent Labs Lab 03/19/15 0521  WBC 4.7  HGB 11.2*  HCT 32.5*  PLT 138*   ------------------------------------------------------------------------------------------------------------------  Chemistries   Recent Labs Lab 03/19/15 0521 03/20/15 0507  NA 141 142  K 3.4* 3.6  CL 105 105  CO2 29 28  GLUCOSE 120* 107*  BUN 22* 18  CREATININE 1.31* 1.17*  CALCIUM 7.8* 8.6*  MG 1.8  --   AST 22  --   ALT 14  --   ALKPHOS 50  --   BILITOT 0.7  --     ------------------------------------------------------------------------------------------------------------------  Cardiac Enzymes No results for input(s): TROPONINI in the last 168 hours. ------------------------------------------------------------------------------------------------------------------  RADIOLOGY:  Dg Abd 2 Views  03/19/2015  CLINICAL DATA:  Right-sided abdominal pain and nausea and vomiting for 3 weeks with intermittent fevers and chills EXAM: ABDOMEN - 2 VIEW COMPARISON:  None. FINDINGS: Nonobstructive bowel gas pattern. No abnormal opacities over the abdomen or pelvis. Mild stool retained throughout the large bowel. IMPRESSION: Bowel gas pattern normal Electronically Signed   By: Skipper Cliche M.D.   On: 03/19/2015 12:40     ASSESSMENT AND PLAN:   75 year old female with history of diet-controlled diabetes who presents with nausea and vomiting.  Biliary colic: Patient's symptoms are considered biliary colic. There is no evidence of acute cholecystitis as per Dr. Burt Knack. Patient is planned for surgery. Management as per surgery.  2. Diet Controlled diabetes type 2 : Hemoglobin A1c 6.1.  Continue sliding scale insulin and ADA diet when patient is eating. 3. Acute kidney injury: This is secondary dehydration. Creatinine is 1.17. This is resolved.  4. Hypokalemia: This was repleted and resolved.  Management plans discussed with the patient and she is in agreement.  CODE STATUS: FULl  TOTAL TIME TAKING CARE OF THIS PATIENT: 30 minutes.   I will sign off. Please call if you further questions thank you.  POSSIBLE D/C tomorrow??, DEPENDING ON CLINICAL CONDITION.   Lesslie Mossa M.D on 03/20/2015 at 10:29 AM  Between 7am to 6pm - Pager - [phone] After 6pm go to [email or web address] - [facility]  Office  [phone]  CC: Primary care physician; Ria Bush, MD  Note: This dictation was prepared with Dragon dictation  along with smaller phrase technology. Any transcriptional errors that result from this process are unintentional.

## 2015-03-20 NOTE — Care Management Important Message (Signed)
Important Message  Patient Details  Name: Kathlean Paone MRN: [id number] Date of Birth: [birth year]   Medicare Important Message Given:  Yes    Juliann Pulse A Gladis Soley 03/20/2015, 11:48 AM

## 2015-03-20 NOTE — Transfer of Care (Signed)
Immediate Anesthesia Transfer of Care Note  Patient: Lisa Osborne  Procedure(s) Performed: Procedure(s): LAPAROSCOPIC CHOLECYSTECTOMY (N/A)  Patient Location: PACU  Anesthesia Type:General  Level of Consciousness: sedated  Airway & Oxygen Therapy: Patient Spontanous Breathing and Patient connected to face mask oxygen  Post-op Assessment: Report given to RN  Post vital signs: Reviewed and stable  Last Vitals:  Filed Vitals:   03/20/15 1302 03/20/15 1733  BP: 131/57 142/66  Pulse: 59 63  Temp: 37.2 C 36.8 C  Resp: 18 16    Complications: No apparent anesthesia complications

## 2015-03-20 NOTE — Anesthesia Preprocedure Evaluation (Addendum)
Anesthesia Evaluation  Patient identified by MRN, date of birth, ID band Patient awake    Reviewed: Allergy & Precautions, H&P , NPO status , Patient's Chart, lab work & pertinent test results, reviewed documented beta blocker date and time   Airway Mallampati: II  TM Distance: >3 FB Neck ROM: full    Dental  (+) Poor Dentition   Pulmonary neg pulmonary ROS, former smoker,    Pulmonary exam normal        Cardiovascular Exercise Tolerance: Good negative cardio ROS Normal cardiovascular exam Rhythm:regular Rate:Normal     Neuro/Psych PSYCHIATRIC DISORDERS negative neurological ROS  negative psych ROS   GI/Hepatic negative GI ROS, Neg liver ROS, GERD  ,  Endo/Other  negative endocrine ROSdiabetes, Well ControlledHypothyroidism   Renal/GU Renal diseasenegative Renal ROS  negative genitourinary   Musculoskeletal   Abdominal   Peds  Hematology negative hematology ROS (+) anemia ,   Anesthesia Other Findings Past Medical History:   History of iron deficiency anemia                            Arthritis                                                      Comment:left side lower back pain   History of chicken pox                                       Depression                                                     Comment:severe in past   Diabetes mellitus type 2, controlled ([place])      2006           Comment:diet controlled   GERD (gastroesophageal reflux disease)                         Comment:bad if off PPI   Seasonal allergies                                           Hypothyroidism                                                 Comment:mild   Urine incontinence                                             Comment:mild, wears pad   Unilateral deafnesses  Comment:left ear deaf, wears hearing aid right side   Rosacea                                                      Anemia                                                        Abnormal EKG                                                   Comment:incomplete RBBB   Colon adenocarcinoma - rectosigmoid T3N1 (1/15* 04/14/2012      Comment:Status post laparoscopic lower anterior               resection Past Surgical History:   TOTAL VAGINAL HYSTERECTOMY                       1985           Comment:cancer cells on cervix   ABDOMINOPLASTY                                   [place]                                      08/2006         Comment:normal   FACELIFT                                         05/2011       ABDOMINAL HYSTERECTOMY                           1985           Comment:total   LAPAROSCOPIC LOW ANTERIOR RESECTION             N/A 04/14/2012      Comment:colon cancer at rectosigmoid junction s/p rigid              proctoscopy splenic flexure mobilization lysis               of adhesion;  Surgeon: Adin Hector, MD;    COLONOSCOPY                                      03/2013         Comment:2 polyps,  mild diverticulosis, rpt 3 yrs               ([place]) BMI    Body Mass Index   33.90 kg/m 2     Reproductive/Obstetrics negative OB ROS                             Anesthesia Physical Anesthesia Plan  ASA: III and emergent  Anesthesia Plan: General and General ETT   Post-op Pain Management:    Induction:   Airway Management Planned:   Additional Equipment:   Intra-op Plan:   Post-operative Plan:   Informed Consent: I have reviewed the patients History and Physical, chart, labs and discussed the procedure including the risks, benefits and alternatives for the proposed anesthesia with the patient or authorized representative who has indicated his/her understanding and acceptance.   Dental Advisory Given  Plan Discussed with: CRNA  Anesthesia Plan Comments:         Anesthesia Quick  Evaluation

## 2015-03-20 NOTE — Anesthesia Procedure Notes (Signed)
Procedure Name: Intubation Date/Time: 03/20/2015 4:26 PM Performed by: Dionne Bucy Pre-anesthesia Checklist: Patient identified, Patient being monitored, Timeout performed, Emergency Drugs available and Suction available Patient Re-evaluated:Patient Re-evaluated prior to inductionOxygen Delivery Method: Circle system utilized Preoxygenation: Pre-oxygenation with 100% oxygen Intubation Type: IV induction Ventilation: Mask ventilation without difficulty Laryngoscope Size: Mac and 3 Grade View: Grade II Tube type: Oral Tube size: 7.0 mm Number of attempts: 1 Airway Equipment and Method: Stylet Placement Confirmation: ETT inserted through vocal cords under direct vision,  positive ETCO2 and breath sounds checked- equal and bilateral Secured at: 21 cm Tube secured with: Tape Dental Injury: Teeth and Oropharynx as per pre-operative assessment

## 2015-03-21 ENCOUNTER — Encounter: Payer: Self-pay | Admitting: Surgery

## 2015-03-21 LAB — COMPREHENSIVE METABOLIC PANEL
ALK PHOS: 55 U/L (ref 38–126)
ALT: 29 U/L (ref 14–54)
ANION GAP: 8 (ref 5–15)
AST: 53 U/L — ABNORMAL HIGH (ref 15–41)
Albumin: 3 g/dL — ABNORMAL LOW (ref 3.5–5.0)
BUN: 13 mg/dL (ref 6–20)
CALCIUM: 8.4 mg/dL — AB (ref 8.9–10.3)
CO2: 30 mmol/L (ref 22–32)
Chloride: 103 mmol/L (ref 101–111)
Creatinine, Ser: 1 mg/dL (ref 0.44–1.00)
GFR calc non Af Amer: 55 mL/min — ABNORMAL LOW (ref >60)
Glucose, Bld: 131 mg/dL — ABNORMAL HIGH (ref 65–99)
POTASSIUM: 4 mmol/L (ref 3.5–5.1)
SODIUM: 141 mmol/L (ref 135–145)
TOTAL PROTEIN: 6.3 g/dL — AB (ref 6.5–8.1)
Total Bilirubin: 0.2 mg/dL — ABNORMAL LOW (ref 0.3–1.2)

## 2015-03-21 LAB — CBC WITH DIFFERENTIAL/PLATELET
Basophils Absolute: 0 10*3/uL (ref 0–0.1)
Basophils Relative: 0 %
EOS ABS: 0 10*3/uL (ref 0–0.7)
EOS PCT: 0 %
HCT: 31.6 % — ABNORMAL LOW (ref 35.0–47.0)
HEMOGLOBIN: 10.8 g/dL — AB (ref 12.0–16.0)
LYMPHS ABS: 0.8 10*3/uL — AB (ref 1.0–3.6)
Lymphocytes Relative: 16 %
MCH: 31.7 pg (ref 26.0–34.0)
MCHC: 34.2 g/dL (ref 32.0–36.0)
MCV: 92.7 fL (ref 80.0–100.0)
MONO ABS: 0.7 10*3/uL (ref 0.2–0.9)
MONOS PCT: 15 %
NEUTROS PCT: 69 %
Neutro Abs: 3.4 10*3/uL (ref 1.4–6.5)
Platelets: 127 10*3/uL — ABNORMAL LOW (ref 150–440)
RBC: 3.41 MIL/uL — ABNORMAL LOW (ref 3.80–5.20)
RDW: 13.4 % (ref 11.5–14.5)
WBC: 5 10*3/uL (ref 3.6–11.0)

## 2015-03-21 LAB — GLUCOSE, CAPILLARY
GLUCOSE-CAPILLARY: 149 mg/dL — AB (ref 65–99)
Glucose-Capillary: 103 mg/dL — ABNORMAL HIGH (ref 65–99)

## 2015-03-21 MED ORDER — HYDROCODONE-ACETAMINOPHEN 5-325 MG PO TABS: 1.0000 | ORAL_TABLET | ORAL | Status: DC | PRN

## 2015-03-21 NOTE — Discharge Instructions (Signed)

## 2015-03-21 NOTE — Progress Notes (Signed)
03/21/2015  BP 159/63 mmHg  Pulse 57  Temp(Src) 98.2 F (36.8 C) (Oral)  Resp 20  Ht 5\' 9"  (1.753 m)  Wt 104.191 kg (229 lb 11.2 oz)  BMI 33.91 kg/m2  SpO2 94% Patient discharged per MD orders. JP drain removed per policy. Dressing dry and intact. Discharge instructions reviewed with patient and patient verbalized understanding. IV removed per policy. Prescriptions discussed and given to patient. Discharged via wheelchair escorted by auxilary.  Almedia Balls, RN

## 2015-03-21 NOTE — Progress Notes (Signed)
[facility] [place] NAME: Lisa Osborne    MR#:  [id number]  DATE OF BIRTH:  1940-07-04  SUBJECTIVE:  Patient is doing well postoperatively. She has some abdominal pain.  REVIEW OF SYSTEMS:    Review of Systems  Constitutional: Negative for fever, chills and malaise/fatigue.  HENT: Negative for sore throat.   Eyes: Negative for blurred vision.  Respiratory: Negative for cough, hemoptysis, shortness of breath and wheezing.   Cardiovascular: Negative for chest pain, palpitations and leg swelling.  Gastrointestinal: Positive for abdominal pain. Negative for nausea, vomiting, diarrhea and blood in stool.  Genitourinary: Negative for dysuria.  Musculoskeletal: Negative for back pain.  Neurological: Negative for dizziness, tremors and headaches.  Endo/Heme/Allergies: Does not bruise/bleed easily.    Tolerating Diet: Yes     DRUG ALLERGIES:   Allergies  Allergen Reactions  . Latex     Mild reaction  . Nickel     Slight itch  . Shrimp [Shellfish Allergy]     Slight allergy - itching    VITALS:  Blood pressure 159/63, pulse 57, temperature 98.2 F (36.8 C), temperature source Oral, resp. rate 20, height 5\' 9"  (1.753 m), weight 104.191 kg (229 lb 11.2 oz), SpO2 94 %.  PHYSICAL EXAMINATION:   Physical Exam  Constitutional: She is oriented to person, place, and time and well-developed, well-nourished, and in no distress. No distress.  HENT:  Head: Normocephalic.  Eyes: No scleral icterus.  Neck: Normal range of motion. Neck supple. No JVD present. No tracheal deviation present.  Cardiovascular: Normal rate, regular rhythm and normal heart sounds.  Exam reveals no gallop and no friction rub.   No murmur heard. Pulmonary/Chest: Effort normal and breath sounds normal. No respiratory distress. She has no wheezes. She has no rales. She exhibits no tenderness.  Abdominal: Bowel sounds are normal. She exhibits no distension  and no mass. There is no tenderness. There is no rebound and no guarding.  Drain placed  Musculoskeletal: Normal range of motion. She exhibits no edema.  Neurological: She is alert and oriented to person, place, and time.  Skin: Skin is warm. No rash noted. No erythema.  Psychiatric: Affect and judgment normal.      LABORATORY PANEL:   CBC  Recent Labs Lab 03/21/15 0620  WBC 5.0  HGB 10.8*  HCT 31.6*  PLT 127*   ------------------------------------------------------------------------------------------------------------------  Chemistries   Recent Labs Lab 03/19/15 0521  03/21/15 0620  NA 141  < > 141  K 3.4*  < > 4.0  CL 105  < > 103  CO2 29  < > 30  GLUCOSE 120*  < > 131*  BUN 22*  < > 13  CREATININE 1.31*  < > 1.00  CALCIUM 7.8*  < > 8.4*  MG 1.8  --   --   AST 22  --  53*  ALT 14  --  29  ALKPHOS 50  --  55  BILITOT 0.7  --  0.2*  < > = values in this interval not displayed. ------------------------------------------------------------------------------------------------------------------  Cardiac Enzymes No results for input(s): TROPONINI in the last 168 hours. ------------------------------------------------------------------------------------------------------------------  RADIOLOGY:  Dg Abd 2 Views  03/19/2015  CLINICAL DATA:  Right-sided abdominal pain and nausea and vomiting for 3 weeks with intermittent fevers and chills EXAM: ABDOMEN - 2 VIEW COMPARISON:  None. FINDINGS: Nonobstructive bowel gas pattern. No abnormal opacities over the abdomen or pelvis. Mild stool retained throughout the large bowel. IMPRESSION: Bowel  gas pattern normal Electronically Signed   By: Skipper Cliche M.D.   On: 03/19/2015 12:40     ASSESSMENT AND PLAN:   75 year old female with history of diet-controlled diabetes who presents with nausea and vomiting.  1. Biliary colic: Patient's symptoms are considered biliary colic. There is no evidence of acute cholecystitis as per  Dr. Burt Knack. Postoperative day #1. Management as per surgery.  2. Diet Controlled diabetes type 2 : Hemoglobin A1c 6.1.  Continue sliding scale insulin and ADA diet  and can follow up with her outpatient primary doctor.  3. Acute kidney injury: This is secondary dehydration. Creatinine is 1.17. This is resolved.  4. Hypokalemia: This was repleted and resolved.  Management plans discussed with the patient and she is in agreement.  CODE STATUS: FULl  TOTAL TIME TAKING CARE OF THIS PATIENT: 2 minutes.   I will sign off. Please call if you further questions thank you.  POSSIBLE D/C today,  Lisa Osborne M.D on 03/21/2015 at 11:01 AM  Between 7am to 6pm - Pager - [phone] After 6pm go to [email or web address] - password [email or web address] [facility]  Office  [phone]  CC: Primary care physician; Ria Bush, MD  Note: This dictation was prepared with Dragon dictation along with smaller phrase technology. Any transcriptional errors that result from this process are unintentional.

## 2015-03-21 NOTE — Discharge Summary (Signed)
Patient ID: Kayren Kissling MRN: [id number] DOB/AGE: [birth year] 75 y.o.  Admit date: 03/18/2015 Discharge date: 03/21/2015  Discharge Diagnoses:  Acute cholecystitis  Procedures Performed: Laparoscopic cholecystectomy w IOC  Discharged Condition: stable  Hospital Course: 75 year old diabetic female with a history of abdominal pain nausea and vomiting and seen in the emergency room ultrasound showing evidence of cholelithiasis with cholecystitis. She was admitted to the hospital place an IV antibiotics and she did have some PVCs and cardiology was consulted and they determined this was nothing major and recommended to proceed with laparoscopic cholecystectomy. Dr. Burt Knack perform a laparoscopic cholecystectomy on 03/20/2015 and this was uneventful. Patient was kept overnight at the time of discharge she was ambulating, tolerating regular diet her vital signs were stable. Abdominal exam was soft, incisions were clean dry and intact without evidence of infection. JP was serosanguineous drainage.  Discharge Orders: Discharge Instructions    Call MD for:  difficulty breathing, headache or visual disturbances    Complete by:  As directed      Call MD for:  extreme fatigue    Complete by:  As directed      Call MD for:  hives    Complete by:  As directed      Call MD for:  persistant dizziness or light-headedness    Complete by:  As directed      Call MD for:  persistant nausea and vomiting    Complete by:  As directed      Call MD for:  redness, tenderness, or signs of infection (pain, swelling, redness, odor or green/yellow discharge around incision site)    Complete by:  As directed      Call MD for:  severe uncontrolled pain    Complete by:  As directed      Call MD for:  temperature >100.4    Complete by:  As directed      Diet - low sodium heart healthy    Complete by:  As directed      Discharge instructions    Complete by:  As directed   Please remove JP before DC     Discharge wound care:    Complete by:  As directed   May need daily bandaid changes/dry dressing to JP site. Shower daily starting in am     Increase activity slowly    Complete by:  As directed      Lifting restrictions    Complete by:  As directed   No heavy lifting ( < 20lbs for 6 weeks)           Disposition: 01-Home or Self Care  Discharge Medications:  Current facility-administered medications:  .  acetaminophen (TYLENOL) tablet 650 mg, 650 mg, Oral, Q6H PRN, Clayburn Pert, MD, 650 mg at 03/19/15 2113 .  diphenhydrAMINE (BENADRYL) 12.5 MG/5ML elixir 12.5 mg, 12.5 mg, Oral, Q6H PRN **OR** diphenhydrAMINE (BENADRYL) injection 12.5 mg, 12.5 mg, Intravenous, Q6H PRN, Clayburn Pert, MD .  hydrALAZINE (APRESOLINE) injection 10 mg, 10 mg, Intravenous, Q2H PRN, Clayburn Pert, MD .  HYDROcodone-acetaminophen (NORCO/VICODIN) 5-325 MG per tablet 1 tablet, 1 tablet, Oral, Q4H PRN, Florene Glen, MD .  insulin aspart (novoLOG) injection 0-9 Units, 0-9 Units, Subcutaneous, TID WC, Bettey Costa, MD, 0 Units at 03/19/15 1200 .  lactated ringers infusion, , Intravenous, Continuous, Clayburn Pert, MD, Last Rate: 125 mL/hr at 03/21/15 0749 .  morphine 2 MG/ML injection 2 mg, 2 mg, Intravenous, Q4H PRN, Clayburn Pert, MD, 2 mg at  03/21/15 0920 .  ondansetron (ZOFRAN-ODT) disintegrating tablet 4 mg, 4 mg, Oral, Q6H PRN **OR** ondansetron (ZOFRAN) injection 4 mg, 4 mg, Intravenous, Q6H PRN, Clayburn Pert, MD, 4 mg at 03/21/15 0322 .  pantoprazole (PROTONIX) injection 40 mg, 40 mg, Intravenous, QHS, Clayburn Pert, MD, 40 mg at 03/20/15 2120 .  piperacillin-tazobactam (ZOSYN) IVPB 3.375 g, 3.375 g, Intravenous, 3 times per day, Clayburn Pert, MD, 3.375 g at 03/21/15 0748 .  sertraline (ZOLOFT) tablet 50 mg, 50 mg, Oral, Daily, Clayburn Pert, MD, 50 mg at 03/21/15 0920  Follwup: Follow-up Information    Follow up with [facility] In 2 weeks.   Contact  information:   [place] [id number]      Signed: Jules Husbands 03/21/2015, 10:41 AM   Caroleen Hamman, MD FACS

## 2015-03-24 LAB — SURGICAL PATHOLOGY

## 2015-03-24 NOTE — Anesthesia Postprocedure Evaluation (Signed)
Anesthesia Post Note  Patient: Lisa Osborne  Procedure(s) Performed: Procedure(s) (LRB): LAPAROSCOPIC CHOLECYSTECTOMY (N/A)  Patient location during evaluation: PACU Anesthesia Type: General Level of consciousness: awake and alert Pain management: pain level controlled Vital Signs Assessment: post-procedure vital signs reviewed and stable Respiratory status: spontaneous breathing, nonlabored ventilation, respiratory function stable and patient connected to nasal cannula oxygen Cardiovascular status: blood pressure returned to baseline and stable Postop Assessment: no signs of nausea or vomiting Anesthetic complications: no    Last Vitals:  Filed Vitals:   03/20/15 2031 03/21/15 0655  BP: 97/61 159/63  Pulse: 62 57  Temp: 37 C 36.8 C  Resp: 16 20    Last Pain:  Filed Vitals:   03/21/15 1045  PainSc: [place]

## 2015-03-31 ENCOUNTER — Encounter: Payer: Self-pay | Admitting: Surgery

## 2015-04-01 ENCOUNTER — Encounter: Payer: Self-pay | Admitting: Surgery

## 2015-04-02 ENCOUNTER — Encounter: Payer: Self-pay | Admitting: Surgery

## 2015-04-02 ENCOUNTER — Ambulatory Visit (INDEPENDENT_AMBULATORY_CARE_PROVIDER_SITE_OTHER): Payer: Medicare Other | Admitting: Surgery

## 2015-04-02 VITALS — BP 152/86 | HR 70 | Temp 98.0°F | Wt 231.0 lb

## 2015-04-02 DIAGNOSIS — K810 Acute cholecystitis: Secondary | ICD-10-CM

## 2015-04-02 NOTE — Patient Instructions (Signed)
Please call with any questions or concerns.

## 2015-04-02 NOTE — Progress Notes (Signed)
[place] patient status post laparoscopic cholecystectomy. She has no complaints at this time did have some constipation issues but is tolerating a regular diet and is overall feeling well.   physical exam demonstrates no icterus no jaundice abdomen is soft and nontender wounds are healing well without erythema or drainage.  Patient doing very well pathology is negative or benign she will follow-up with [place]  on an as-needed basis

## 2015-06-02 ENCOUNTER — Other Ambulatory Visit: Payer: Self-pay | Admitting: Family Medicine

## 2015-06-02 MED ORDER — SERTRALINE HCL 50 MG PO TABS: 50.0000 mg | ORAL_TABLET | Freq: Every day | ORAL | Status: DC

## 2015-07-25 ENCOUNTER — Ambulatory Visit (INDEPENDENT_AMBULATORY_CARE_PROVIDER_SITE_OTHER): Payer: Medicare Other

## 2015-07-25 ENCOUNTER — Other Ambulatory Visit: Payer: Self-pay | Admitting: Family Medicine

## 2015-07-25 VITALS — BP 122/80 | HR 74 | Temp 97.8°F | Ht 68.0 in | Wt 240.5 lb

## 2015-07-25 DIAGNOSIS — Z0000 Encounter for general adult medical examination without abnormal findings: Secondary | ICD-10-CM

## 2015-07-25 DIAGNOSIS — E785 Hyperlipidemia, unspecified: Secondary | ICD-10-CM

## 2015-07-25 DIAGNOSIS — N179 Acute kidney failure, unspecified: Secondary | ICD-10-CM

## 2015-07-25 DIAGNOSIS — C189 Malignant neoplasm of colon, unspecified: Secondary | ICD-10-CM

## 2015-07-25 DIAGNOSIS — E039 Hypothyroidism, unspecified: Secondary | ICD-10-CM

## 2015-07-25 DIAGNOSIS — K810 Acute cholecystitis: Secondary | ICD-10-CM

## 2015-07-25 DIAGNOSIS — E119 Type 2 diabetes mellitus without complications: Secondary | ICD-10-CM

## 2015-07-25 NOTE — Progress Notes (Signed)
Subjective:   Lisa Osborne is a 75 y.o. female who presents for Medicare Annual (Subsequent) preventive examination.  Review of Systems:  N/A Cardiac Risk Factors include: advanced age (>58mn, >>28women);obesity (BMI >30kg/m2);diabetes mellitus     Objective:     Vitals: BP 122/80 mmHg  Pulse 74  Temp(Src) 97.8 F (36.6 C) (Oral)  Ht 5' 8"  (1.727 m)  Wt 240 lb 8 oz (109.09 kg)  BMI 36.58 kg/m2  SpO2 92%  Body mass index is 36.58 kg/(m^2).   Tobacco History  Smoking status  . Former Smoker  . Quit date: 04/06/1967  Smokeless tobacco  . Not on file     Counseling given: No   Past Medical History  Diagnosis Date  . History of iron deficiency anemia   . Arthritis     left side lower back pain  . History of chicken pox   . Depression     severe in past  . Diabetes mellitus type 2, controlled ([employer] 2006    diet controlled  . GERD (gastroesophageal reflux disease)     bad if off PPI  . Seasonal allergies   . Hypothyroidism     mild  . Urine incontinence     mild, wears pad  . Unilateral deafnesses     left ear deaf, wears hearing aid right side  . Rosacea   . Anemia   . Abnormal EKG     incomplete RBBB  . Colon adenocarcinoma - rectosigmoid T3N1 (1/15) s/p lap LAR 04/14/2012    Status post laparoscopic lower anterior resection   Past Surgical History  Procedure Laterality Date  . Total vaginal hysterectomy  1985    cancer cells on cervix  . Abdominoplasty  1996  . Incontinence surgery  1996  . Colonoscopy  08/2006    normal  . Facelift  05/2011  . Abdominal hysterectomy  1985    total  . Laparoscopic low anterior resection N/A 04/14/2012    colon cancer at rectosigmoid junction s/p rigid proctoscopy splenic flexure mobilization lysis of adhesion;  Surgeon: SAdin Hector MD;   . Colonoscopy  03/2013    2 polyps, mild diverticulosis, rpt 3 yrs ([name]  . Cholecystectomy N/A 03/20/2015    Procedure: LAPAROSCOPIC CHOLECYSTECTOMY;  Surgeon:  RFlorene Glen MD;  Location: [facility] ORS;  Service: General;  Laterality: N/A;   Family History  Problem Relation Age of Onset  . Breast cancer Mother 618   breast cancer then multiple myeloma  . Cancer Mother     breast  . Alcohol abuse Father   . Heart disease Father     heart failure  . Mental illness Brother     2 brothers severe depression, suicide x2  . Diabetes Maternal Aunt   . Diabetes Maternal Uncle   . Diabetes Paternal Uncle   . Diabetes Paternal Grandfather   . Coronary artery disease Neg Hx   . Stroke Neg Hx    History  Sexual Activity  . Sexual Activity: No    Outpatient Encounter Prescriptions as of 07/25/2015  Medication Sig  . Calcium Citrate-Vitamin D (CALCIUM CITRATE + D PO) Take 1 tablet by mouth daily.   . Coenzyme Q10 (COQ10) 200 MG CAPS Take 1 capsule by mouth daily.   . lansoprazole (PREVACID) 15 MG capsule Take one tablet daily  . levothyroxine (SYNTHROID, LEVOTHROID) 25 MCG tablet TAKE ONE TABLET BY MOUTH DAILY  . loratadine (CLARITIN) 10 MG tablet Take 10 mg by  mouth daily as needed for allergies. Reported on 03/17/2015  . Melatonin 5 MG TABS Take 5 mg by mouth at bedtime.   . Omega-3 Fatty Acids (FISH OIL) 1000 MG CAPS Take 1 capsule by mouth daily.  . polycarbophil (FIBERCON) 625 MG tablet Take 625 mg by mouth daily. Not sure of dose  . vitamin B-12 (CYANOCOBALAMIN) 1000 MCG tablet Take 1,000 mcg by mouth daily.  . sertraline (ZOLOFT) 100 MG tablet Take 0.5 tablets by mouth 1 day or 1 dose.   . sertraline (ZOLOFT) 50 MG tablet Take 1 tablet (50 mg total) by mouth daily. (Patient not taking: Reported on 07/25/2015)   No facility-administered encounter medications on file as of 07/25/2015.    Activities of Daily Living In your present state of health, do you have any difficulty performing the following activities: 07/25/2015 03/18/2015  Hearing? N Y  Vision? N N  Difficulty concentrating or making decisions? N N  Walking or climbing stairs? N Y    Dressing or bathing? N N  Doing errands, shopping? N N  Preparing Food and eating ? N -  Using the Toilet? N -  In the past six months, have you accidently leaked urine? Y -  Do you have problems with loss of bowel control? N -  Managing your Medications? N -  Managing your Finances? N -  Housekeeping or managing your Housekeeping? N -    Patient Care Team: Ria Bush, MD as PCP - General (Family Medicine) Michael Boston, MD as Consulting Physician (General Surgery) Inda Castle, MD as Consulting Physician (Gastroenterology) Carola Frost, RN as Registered Nurse (Oncology) Ladell Pier, MD as Attending Physician ([place]) Thelma Comp, OD as Referring Physician (Optometry) Owens Shark, NP as Nurse Practitioner (Hematology and Oncology) Ladell Pier, MD as Consulting Physician (Oncology)    Assessment:     Hearing Screening   125Hz  250Hz  500Hz  1000Hz  2000Hz  4000Hz  8000Hz   Right ear:   40 0 40 40   Left ear:         Comments: Pt is deaf in left ear  Vision Screening Comments: Last vision exam approx. 1 year ago [facility]   Exercise Activities and Dietary recommendations Current Exercise Habits: Home exercise routine, Type of exercise: walking, Time (Minutes): 30, Frequency (Times/Week): 2, Weekly Exercise (Minutes/Week): 60, Exercise limited by: None identified  Goals    . Increase physical activity     Starting 07/25/2015, I will walk at least 30 min 2-3 days per week.       Fall Risk Fall Risk  07/25/2015 06/11/2014 09/07/2013  Falls in the past year? Yes Yes No  Number falls in past yr: 2 or more 1 -  Injury with Fall? Yes Yes -  Follow up Falls evaluation completed;Falls prevention discussed;Education provided - -   Depression Screen PHQ 2/9 Scores 07/25/2015 06/11/2014  PHQ - 2 Score 0 0     Cognitive Testing MMSE - Mini Mental State Exam 07/25/2015  Orientation to time 5  Orientation to Place 5  Registration 3   Attention/ Calculation 0  Recall 3  Language- name 2 objects 0  Language- repeat 1  Language- follow 3 step command 3  Language- read & follow direction 0  Write a sentence 0  Copy design 0  Total score 20   PLEASE NOTE: A Mini-Cog screen was completed. Maximum score is 20. A value of 0 denotes this part of [place] MMSE was not completed or  the patient failed this part of the Mini-Cog screening.   Mini-Cog Screening Orientation to Time - Max 5 pts Orientation to Place - Max 5 pts Registration - Max 3 pts Recall - Max 3 pts Language Repeat - Max 1 pts Language Follow 3 Step Command - Max 3 pts  Immunization History  Administered Date(s) Administered  . Influenza Split 11/26/2010  . Influenza,inj,Quad PF,36+ Mos 11/09/2012  . Influenza-Unspecified 12/26/2013  . Pneumococcal Conjugate-13 05/22/2013  . Pneumococcal Polysaccharide-23 01/07/2007  . Td 08/22/2008   Screening Tests Health Maintenance  Topic Date Due  . FOOT EXAM  07/30/2015 (Originally 05/23/2014)  . URINE MICROALBUMIN  07/30/2015 (Originally 06/07/2015)  . OPHTHALMOLOGY EXAM  10/26/2015 (Originally 07/03/2015)  . ZOSTAVAX  07/25/2023 (Originally 01/06/2002)  . INFLUENZA VACCINE  08/26/2015  . HEMOGLOBIN A1C  09/16/2015  . MAMMOGRAM  01/08/2016  . COLONOSCOPY  04/05/2016  . PAP SMEAR  07/24/2016  . TETANUS/TDAP  08/23/2018  . DTaP/Tdap/Td  Completed  . DEXA SCAN  Completed  . PNA vac Low Risk Adult  Completed      Plan:     I have personally reviewed and addressed the Medicare Annual Wellness questionnaire and have noted the following in the patient's chart:  A. Medical and social history B. Use of alcohol, tobacco or illicit drugs  C. Current medications and supplements D. Functional ability and status E.  Nutritional status F.  Physical activity G. Advance directives H. List of other physicians I.  Hospitalizations, surgeries, and ER visits in previous 12 months J.  [place] to include  hearing, vision, cognitive, depression L. Referrals and appointments - none  In addition, I have reviewed and discussed with patient certain preventive protocols, quality metrics, and best practice recommendations. A written personalized care plan for preventive services as well as general preventive health recommendations were provided to patient.  See attached scanned questionnaire for additional information.   Signed,   Lindell Noe, MHA, BS, LPN Health Advisor

## 2015-07-25 NOTE — Patient Instructions (Addendum)
[name] , Thank you for taking time to come for your Medicare Wellness Visit. I appreciate your ongoing commitment to your health goals. Please review the following plan we discussed and let me know if I can assist you in the future.   These are the goals we discussed: Goals    . Increase physical activity     Starting 07/25/2015, I will walk at least 30 min 2-3 days per week.        This is a list of the screening recommended for you and due dates:  Health Maintenance  Topic Date Due  . Complete foot exam   07/30/2015*  . Urine Protein Check  07/30/2015*  . Eye exam for diabetics  10/26/2015*  . Shingles Vaccine  07/25/2023*  . Flu Shot  08/26/2015  . Hemoglobin A1C  09/16/2015  . Mammogram  01/08/2016  . Colon Cancer Screening  04/05/2016  . Pap Smear  07/24/2016  . Tetanus Vaccine  08/23/2018  . DTaP/Tdap/Td vaccine  Completed  . DEXA scan (bone density measurement)  Completed  . Pneumonia vaccines  Completed  *Topic was postponed. The date shown is not the original due date.    Preventive Care for Adults  A healthy lifestyle and preventive care can promote health and wellness. Preventive health guidelines for adults include the following key practices.  . A routine yearly physical is a good way to check with your health care provider about your health and preventive screening. It is a chance to share any concerns and updates on your health and to receive a thorough exam.  . Visit your dentist for a routine exam and preventive care every 6 months. Brush your teeth twice a day and floss once a day. Good oral hygiene prevents tooth decay and gum disease.  . The frequency of eye exams is based on your age, health, family medical history, use  of contact lenses, and other factors. Follow your health care provider's ecommendations for frequency of eye exams.  . Eat a healthy diet. Foods like vegetables, fruits, whole grains, low-fat dairy products, and lean protein foods  contain the nutrients you need without too many calories. Decrease your intake of foods high in solid fats, added sugars, and salt. Eat the right amount of calories for you. Get information about a proper diet from your health care provider, if necessary.  . Regular physical exercise is one of the most important things you can do for your health. Most adults should get at least 150 minutes of moderate-intensity exercise (any activity that increases your heart rate and causes you to sweat) each week. In addition, most adults need muscle-strengthening exercises on 2 or more days a week.  Silver Sneakers may be a benefit available to you. To determine eligibility, you may visit the website: www.silversneakers.com or contact program at 630-281-0204 Mon-Fri between 8AM-8PM.   . Maintain a healthy weight. The body mass index (BMI) is a screening tool to identify possible weight problems. It provides an estimate of body fat based on height and weight. Your health care provider can find your BMI and can help you achieve or maintain a healthy weight.   For adults 20 years and older: ? A BMI below 18.5 is considered underweight. ? A BMI of 18.5 to 24.9 is normal. ? A BMI of 25 to 29.9 is considered overweight. ? A BMI of 30 and above is considered obese.   . Maintain normal blood lipids and cholesterol levels by exercising and minimizing  your intake of saturated fat. Eat a balanced diet with plenty of fruit and vegetables. Blood tests for lipids and cholesterol should begin at age 58 and be repeated every 5 years. If your lipid or cholesterol levels are high, you are over 50, or you are at high risk for heart disease, you may need your cholesterol levels checked more frequently. Ongoing high lipid and cholesterol levels should be treated with medicines if diet and exercise are not working.  . If you smoke, find out from your health care provider how to quit. If you do not use tobacco, please do not  start.  . If you choose to drink alcohol, please do not consume more than 2 drinks per day. One drink is considered to be 12 ounces (355 mL) of beer, 5 ounces (148 mL) of wine, or 1.5 ounces (44 mL) of liquor.  . If you are 87-31 years old, ask your health care provider if you should take aspirin to prevent strokes.  . Use sunscreen. Apply sunscreen liberally and repeatedly throughout the day. You should seek shade when your shadow is shorter than you. Protect yourself by wearing long sleeves, pants, a wide-brimmed hat, and sunglasses year round, whenever you are outdoors.  . Once a month, do a whole body skin exam, using a mirror to look at the skin on your back. Tell your health care provider of new moles, moles that have irregular borders, moles that are larger than a pencil eraser, or moles that have changed in shape or color.     Fall Prevention in the Home  Falls can cause injuries. They can happen to people of all ages. There are many things you can do to make your home safe and to help prevent falls.  WHAT CAN I DO ON THE OUTSIDE OF MY HOME?  Regularly fix the edges of walkways and driveways and fix any cracks.  Remove anything that might make you trip as you walk through a door, such as a raised step or threshold.  Trim any bushes or trees on the path to your home.  Use bright outdoor lighting.  Clear any walking paths of anything that might make someone trip, such as rocks or tools.  Regularly check to see if handrails are loose or broken. Make sure that both sides of any steps have handrails.  Any raised decks and porches should have guardrails on the edges.  Have any leaves, snow, or ice cleared regularly.  Use sand or salt on walking paths during winter.  Clean up any spills in your garage right away. This includes oil or grease spills. WHAT CAN I DO IN THE BATHROOM?   Use night lights.  Install grab bars by the toilet and in the tub and shower. Do not use towel  bars as grab bars.  Use non-skid mats or decals in the tub or shower.  If you need to sit down in the shower, use a plastic, non-slip stool.  Keep the floor dry. Clean up any water that spills on the floor as soon as it happens.  Remove soap buildup in the tub or shower regularly.  Attach bath mats securely with double-sided non-slip rug tape.  Do not have throw rugs and other things on the floor that can make you trip. WHAT CAN I DO IN THE BEDROOM?  Use night lights.  Make sure that you have a light by your bed that is easy to reach.  Do not use any sheets or blankets  that are too big for your bed. They should not hang down onto the floor.  Have a firm chair that has side arms. You can use this for support while you get dressed.  Do not have throw rugs and other things on the floor that can make you trip. WHAT CAN I DO IN THE KITCHEN?  Clean up any spills right away.  Avoid walking on wet floors.  Keep items that you use a lot in easy-to-reach places.  If you need to reach something above you, use a strong step stool that has a grab bar.  Keep electrical cords out of the way.  Do not use floor polish or wax that makes floors slippery. If you must use wax, use non-skid floor wax.  Do not have throw rugs and other things on the floor that can make you trip. WHAT CAN I DO WITH MY STAIRS?  Do not leave any items on the stairs.  Make sure that there are handrails on both sides of the stairs and use them. Fix handrails that are broken or loose. Make sure that handrails are as long as the stairways.  Check any carpeting to make sure that it is firmly attached to the stairs. Fix any carpet that is loose or worn.  Avoid having throw rugs at the top or bottom of the stairs. If you do have throw rugs, attach them to the floor with carpet tape.  Make sure that you have a light switch at the top of the stairs and the bottom of the stairs. If you do not have them, ask someone to  add them for you. WHAT ELSE CAN I DO TO HELP PREVENT FALLS?  Wear shoes that:  Do not have high heels.  Have rubber bottoms.  Are comfortable and fit you well.  Are closed at the toe. Do not wear sandals.  If you use a stepladder:  Make sure that it is fully opened. Do not climb a closed stepladder.  Make sure that both sides of the stepladder are locked into place.  Ask someone to hold it for you, if possible.  Clearly mark and make sure that you can see:  Any grab bars or handrails.  First and last steps.  Where the edge of each step is.  Use tools that help you move around (mobility aids) if they are needed. These include:  Canes.  Walkers.  Scooters.  Crutches.  Turn on the lights when you go into a dark area. Replace any light bulbs as soon as they burn out.  Set up your furniture so you have a clear path. Avoid moving your furniture around.  If any of your floors are uneven, fix them.  If there are any pets around you, be aware of where they are.  Review your medicines with your doctor. Some medicines can make you feel dizzy. This can increase your chance of falling. Ask your doctor what other things that you can do to help prevent falls.   This information is not intended to replace advice given to you by your health care provider. Make sure you discuss any questions you have with your health care provider.   Document Released: 11/07/2008 Document Revised: 05/28/2014 Document Reviewed: 02/15/2014 Elsevier Interactive Patient Education [employer].

## 2015-07-25 NOTE — Progress Notes (Signed)
Pre visit review using our clinic review tool, if applicable. No additional management support is needed unless otherwise documented below in the visit note. 

## 2015-07-25 NOTE — Progress Notes (Signed)
PCP notes:   Health maintenance:  Microalbmumin - will complete at CPE Foot exam - will complete at CPE Eye exam - pt will schedule  Shingles - postponed/cost  Abnormal screenings:  Hearing (right ear only) - failed Fall risk - hx of multiple falls with injury   Patient concerns: pt states she wants to increase dose of Zoloft back to where it was originally. Pt reports increased anxiety.  Nurse concerns: None  Next PCP appt: 07/30/2015 @ 0900

## 2015-07-26 NOTE — Progress Notes (Signed)
I reviewed health advisor's note, was available for consultation, and agree with documentation and plan.  

## 2015-07-30 ENCOUNTER — Encounter: Payer: Self-pay | Admitting: Family Medicine

## 2015-07-30 ENCOUNTER — Ambulatory Visit (INDEPENDENT_AMBULATORY_CARE_PROVIDER_SITE_OTHER): Payer: Medicare Other | Admitting: Family Medicine

## 2015-07-30 ENCOUNTER — Other Ambulatory Visit: Payer: Self-pay | Admitting: Family Medicine

## 2015-07-30 ENCOUNTER — Other Ambulatory Visit (INDEPENDENT_AMBULATORY_CARE_PROVIDER_SITE_OTHER): Payer: Medicare Other

## 2015-07-30 VITALS — BP 130/86 | HR 92 | Temp 97.5°F | Wt 240.5 lb

## 2015-07-30 DIAGNOSIS — E669 Obesity, unspecified: Secondary | ICD-10-CM | POA: Insufficient documentation

## 2015-07-30 DIAGNOSIS — E039 Hypothyroidism, unspecified: Secondary | ICD-10-CM | POA: Diagnosis not present

## 2015-07-30 DIAGNOSIS — Z7189 Other specified counseling: Secondary | ICD-10-CM | POA: Insufficient documentation

## 2015-07-30 DIAGNOSIS — C189 Malignant neoplasm of colon, unspecified: Secondary | ICD-10-CM | POA: Diagnosis not present

## 2015-07-30 DIAGNOSIS — N179 Acute kidney failure, unspecified: Secondary | ICD-10-CM | POA: Diagnosis not present

## 2015-07-30 DIAGNOSIS — Z0000 Encounter for general adult medical examination without abnormal findings: Secondary | ICD-10-CM

## 2015-07-30 DIAGNOSIS — E119 Type 2 diabetes mellitus without complications: Secondary | ICD-10-CM

## 2015-07-30 DIAGNOSIS — K810 Acute cholecystitis: Secondary | ICD-10-CM | POA: Diagnosis not present

## 2015-07-30 DIAGNOSIS — F331 Major depressive disorder, recurrent, moderate: Secondary | ICD-10-CM | POA: Diagnosis not present

## 2015-07-30 DIAGNOSIS — E785 Hyperlipidemia, unspecified: Secondary | ICD-10-CM | POA: Diagnosis not present

## 2015-07-30 DIAGNOSIS — E66811 Obesity, class 1: Secondary | ICD-10-CM | POA: Insufficient documentation

## 2015-07-30 DIAGNOSIS — IMO0001 Reserved for inherently not codable concepts without codable children: Secondary | ICD-10-CM

## 2015-07-30 LAB — COMPREHENSIVE METABOLIC PANEL
ALBUMIN: 4.3 g/dL (ref 3.5–5.2)
ALT: 12 U/L (ref 0–35)
AST: 19 U/L (ref 0–37)
Alkaline Phosphatase: 59 U/L (ref 39–117)
BUN: 20 mg/dL (ref 6–23)
CHLORIDE: 103 meq/L (ref 96–112)
CO2: 32 mEq/L (ref 19–32)
Calcium: 9.2 mg/dL (ref 8.4–10.5)
Creatinine, Ser: 1.11 mg/dL (ref 0.40–1.20)
GFR: 51.13 mL/min — ABNORMAL LOW (ref >60.00)
Glucose, Bld: 138 mg/dL — ABNORMAL HIGH (ref 70–99)
POTASSIUM: 4.1 meq/L (ref 3.5–5.1)
SODIUM: 140 meq/L (ref 135–145)
Total Bilirubin: 0.4 mg/dL (ref 0.2–1.2)
Total Protein: 7.5 g/dL (ref 6.0–8.3)

## 2015-07-30 LAB — LIPID PANEL
CHOLESTEROL: 226 mg/dL — AB (ref 0–200)
HDL: 55 mg/dL (ref >39.00)
LDL CALC: 139 mg/dL — AB (ref 0–99)
NonHDL: 170.7
Total CHOL/HDL Ratio: 4
Triglycerides: 160 mg/dL — ABNORMAL HIGH (ref 0.0–149.0)
VLDL: 32 mg/dL (ref 0.0–40.0)

## 2015-07-30 LAB — HEMOGLOBIN A1C: Hgb A1c MFr Bld: 6.4 % (ref 4.6–6.5)

## 2015-07-30 LAB — CBC WITH DIFFERENTIAL/PLATELET
BASOS ABS: 0 10*3/uL (ref 0.0–0.1)
Basophils Relative: 0.4 % (ref 0.0–3.0)
Eosinophils Absolute: 0 10*3/uL (ref 0.0–0.7)
Eosinophils Relative: 0.6 % (ref 0.0–5.0)
HEMATOCRIT: 37.5 % (ref 36.0–46.0)
HEMOGLOBIN: 12.7 g/dL (ref 12.0–15.0)
LYMPHS PCT: 37 % (ref 12.0–46.0)
Lymphs Abs: 2 10*3/uL (ref 0.7–4.0)
MCHC: 33.9 g/dL (ref 30.0–36.0)
MCV: 93.4 fl (ref 78.0–100.0)
MONOS PCT: 8.2 % (ref 3.0–12.0)
Monocytes Absolute: 0.4 10*3/uL (ref 0.1–1.0)
NEUTROS ABS: 2.9 10*3/uL (ref 1.4–7.7)
Neutrophils Relative %: 53.8 % (ref 43.0–77.0)
PLATELETS: 231 10*3/uL (ref 150.0–400.0)
RBC: 4.01 Mil/uL (ref 3.87–5.11)
RDW: 13.5 % (ref 11.5–15.5)
WBC: 5.4 10*3/uL (ref 4.0–10.5)

## 2015-07-30 LAB — MICROALBUMIN / CREATININE URINE RATIO
Creatinine,U: 225.3 mg/dL
MICROALB/CREAT RATIO: 1 mg/g (ref 0.0–30.0)
Microalb, Ur: 2.3 mg/dL — ABNORMAL HIGH (ref 0.0–1.9)

## 2015-07-30 LAB — TSH: TSH: 2.43 u[IU]/mL (ref 0.35–4.50)

## 2015-07-30 MED ORDER — SERTRALINE HCL 100 MG PO TABS: 100.0000 mg | ORAL_TABLET | Freq: Every day | ORAL | Status: DC

## 2015-07-30 NOTE — Assessment & Plan Note (Signed)
Discussed healthy diet and lifestyle changes to affect sustainable weight loss  

## 2015-07-30 NOTE — Assessment & Plan Note (Addendum)
Chronic, diet controlled Last visit levels improved to prediabetes - recheck today.

## 2015-07-30 NOTE — Assessment & Plan Note (Signed)
Not doing as well on lower sertraline dose. Will return to prior 100mg  dose.

## 2015-07-30 NOTE — Assessment & Plan Note (Signed)
Appreciate onc care - continues seeing Q6 mo

## 2015-07-30 NOTE — Assessment & Plan Note (Signed)
Continue low dose levothyroxine. Check TSH today.

## 2015-07-30 NOTE — Addendum Note (Signed)
Addended by: Marchia Bond on: 07/30/2015 10:23 AM   Modules accepted: Orders

## 2015-07-30 NOTE — Assessment & Plan Note (Signed)
Preventative protocols reviewed and updated unless pt declined. Discussed healthy diet and lifestyle.  

## 2015-07-30 NOTE — Patient Instructions (Addendum)
Labs today. Increase sertraline to 196m daily.  Bring me copy of advanced directive.  Good to see you today,call [facility] questions. Return as needed or in 6 months for follow up visit.   Health Maintenance, Female Adopting a healthy lifestyle and getting preventive care can go a long way to promote health and wellness. Talk with your health care provider about what schedule of regular examinations is right for you. This is a good chance for you to check in with your provider about disease prevention and staying healthy. In between checkups, there are plenty of things you can do on your own. Experts have done a lot of research about which lifestyle changes and preventive measures are most likely to keep you healthy. Ask your health care provider for more information. WEIGHT AND DIET  Eat a healthy diet  Be sure to include plenty of vegetables, fruits, low-fat dairy products, and lean protein.  Do not eat a lot of foods high in solid fats, added sugars, or salt.  Get regular exercise. This is one of the most important things you can do for your health.  Most adults should exercise for at least 150 minutes each week. The exercise should increase your heart rate and make you sweat (moderate-intensity exercise).  Most adults should also do strengthening exercises at least twice a week. This is in addition to the moderate-intensity exercise.  Maintain a healthy weight  Body mass index (BMI) is a measurement that can be used to identify possible weight problems. It estimates body fat based on height and weight. Your health care provider can help determine your BMI and help you achieve or maintain a healthy weight.  For females 272years of age and older:   A BMI below 18.5 is considered underweight.  A BMI of 18.5 to 24.9 is normal.  A BMI of 25 to 29.9 is considered overweight.  A BMI of 30 and above is considered obese.  Watch levels of cholesterol and blood lipids  You should start  having your blood tested for lipids and cholesterol at 74years of age, then have this test every 5 years.  You may need to have your cholesterol levels checked more often if:  Your lipid or cholesterol levels are high.  You are older than 74years of age.  You are at high risk for heart disease.  CANCER SCREENING   Lung Cancer  Lung cancer screening is recommended for adults 588834years old who are at high risk for lung cancer because of a history of smoking.  A yearly low-dose CT scan of the lungs is recommended for people who:  Currently smoke.  Have quit within the past 15 years.  Have at least a 30-pack-year history of smoking. A pack year is smoking an average of one pack of cigarettes a day for 1 year.  Yearly screening should continue until it has been 15 years since you quit.  Yearly screening should stop if you develop a health problem that would prevent you from having lung cancer treatment.  Breast Cancer  Practice breast self-awareness. This means understanding how your breasts normally appear and feel.  It also means doing regular breast self-exams. Let your health care provider know about any changes, no matter how small.  If you are in your 20s or 30s, you should have a clinical breast exam (CBE) by a health care provider every 1-3 years as part of a regular health exam.  If you are 40 or older,  have a CBE every year. Also consider having a breast X-ray (mammogram) every year.  If you have a family history of breast cancer, talk to your health care provider about genetic screening.  If you are at high risk for breast cancer, talk to your health care provider about having an MRI and a mammogram every year.  Breast cancer gene (BRCA) assessment is recommended for women who have family members with BRCA-related cancers. BRCA-related cancers include:  Breast.  Ovarian.  Tubal.  Peritoneal cancers.  Results of the assessment will determine the need for  genetic counseling and BRCA1 and BRCA2 testing. Cervical Cancer Your health care provider may recommend that you be screened regularly for cancer of the pelvic organs (ovaries, uterus, and vagina). This screening involves a pelvic examination, including checking for microscopic changes to the surface of your cervix (Pap test). You may be encouraged to have this screening done every 3 years, beginning at age 40.  For women ages 23-65, health care providers may recommend pelvic exams and Pap testing every 3 years, or they may recommend the Pap and pelvic exam, combined with testing for human papilloma virus (HPV), every 5 years. Some types of HPV increase your risk of cervical cancer. Testing for HPV may also be done on women of any age with unclear Pap test results.  Other health care providers may not recommend any screening for nonpregnant women who are considered low risk for pelvic cancer and who do not have symptoms. Ask your health care provider if a screening pelvic exam is right for you.  If you have had past treatment for cervical cancer or a condition that could lead to cancer, you need Pap tests and screening for cancer for at least 20 years after your treatment. If Pap tests have been discontinued, your risk factors (such as having a new sexual partner) need to be reassessed to determine if screening should resume. Some women have medical problems that increase the chance of getting cervical cancer. In these cases, your health care provider may recommend more frequent screening and Pap tests. Colorectal Cancer  This type of cancer can be detected and often prevented.  Routine colorectal cancer screening usually begins at 74 years of age and continues through 74 years of age.  Your health care provider may recommend screening at an earlier age if you have risk factors for colon cancer.  Your health care provider may also recommend using home test kits to check for hidden blood in the  stool.  A small camera at the end of a tube can be used to examine your colon directly (sigmoidoscopy or colonoscopy). This is done to check for the earliest forms of colorectal cancer.  Routine screening usually begins at age 49.  Direct examination of the colon should be repeated every 5-10 years through 74 years of age. However, you may need to be screened more often if early forms of precancerous polyps or small growths are found. Skin Cancer  Check your skin from head to toe regularly.  Tell your health care provider about any new moles or changes in moles, especially if there is a change in a mole's shape or color.  Also tell your health care provider if you have a mole that is larger than the size of a pencil eraser.  Always use sunscreen. Apply sunscreen liberally and repeatedly throughout the day.  Protect yourself by wearing long sleeves, pants, a wide-brimmed hat, and sunglasses whenever you are outside. HEART DISEASE, DIABETES, AND  HIGH BLOOD PRESSURE   High blood pressure causes heart disease and increases the risk of stroke. High blood pressure is more likely to develop in:  People who have blood pressure in the high end of the normal range (130-139/85-89 mm Hg).  People who are overweight or obese.  People who are African American.  If you are 82-58 years of age, have your blood pressure checked every 3-5 years. If you are 8 years of age or older, have your blood pressure checked every year. You should have your blood pressure measured twice--once when you are at a hospital or clinic, and once when you are not at a hospital or clinic. Record the average of the two measurements. To check your blood pressure when you are not at a hospital or clinic, you can use:  An automated blood pressure machine at a pharmacy.  A home blood pressure monitor.  If you are between 75 years and 23 years old, ask your health care provider if you should take aspirin to prevent  strokes.  Have regular diabetes screenings. This involves taking a blood sample to check your fasting blood sugar level.  If you are at a normal weight and have a low risk for diabetes, have this test once every three years after 74 years of age.  If you are overweight and have a high risk for diabetes, consider being tested at a younger age or more often. PREVENTING INFECTION  Hepatitis B  If you have a higher risk for hepatitis B, you should be screened for this virus. You are considered at high risk for hepatitis B if:  You were born in a country where hepatitis B is common. Ask your health care provider which countries are considered high risk.  Your parents were born in a high-risk country, and you have not been immunized against hepatitis B (hepatitis B vaccine).  You have HIV or AIDS.  You use needles to inject street drugs.  You live with someone who has hepatitis B.  You have had sex with someone who has hepatitis B.  You get hemodialysis treatment.  You take certain medicines for conditions, including cancer, organ transplantation, and autoimmune conditions. Hepatitis C  Blood testing is recommended for:  Everyone born from [birth year] through 1964.  Anyone with known risk factors for hepatitis C. Sexually transmitted infections (STIs)  You should be screened for sexually transmitted infections (STIs) including gonorrhea and chlamydia if:  You are sexually active and are younger than 74 years of age.  You are older than 74 years of age and your health care provider tells you that you are at risk for this type of infection.  Your sexual activity has changed since you were last screened and you are at an increased risk for chlamydia or gonorrhea. Ask your health care provider if you are at risk.  If you do not have HIV, but are at risk, it may be recommended that you take a prescription medicine daily to prevent HIV infection. This is called pre-exposure prophylaxis  (PrEP). You are considered at risk if:  You are sexually active and do not regularly use condoms or know the HIV status of your partner(s).  You take drugs by injection.  You are sexually active with a partner who has HIV. Talk with your health care provider about whether you are at high risk of being infected with HIV. If you choose to begin PrEP, you should first be tested for HIV. You should then be tested  every 3 months for as long as you are taking PrEP.  PREGNANCY   If you are premenopausal and you may become pregnant, ask your health care provider about preconception counseling.  If you may become pregnant, take 400 to 800 micrograms (mcg) of folic acid every day.  If you want to prevent pregnancy, talk to your health care provider about birth control (contraception). OSTEOPOROSIS AND MENOPAUSE   Osteoporosis is a disease in which the bones lose minerals and strength with aging. This can result in serious bone fractures. Your risk for osteoporosis can be identified using a bone density scan.  If you are 58 years of age or older, or if you are at risk for osteoporosis and fractures, ask your health care provider if you should be screened.  Ask your health care provider whether you should take a calcium or vitamin D supplement to lower your risk for osteoporosis.  Menopause may have certain physical symptoms and risks.  Hormone replacement therapy may reduce some of these symptoms and risks. Talk to your health care provider about whether hormone replacement therapy is right for you.  HOME CARE INSTRUCTIONS   Schedule regular health, dental, and eye exams.  Stay current with your immunizations.   Do not use any tobacco products including cigarettes, chewing tobacco, or electronic cigarettes.  If you are pregnant, do not drink alcohol.  If you are breastfeeding, limit how much and how often you drink alcohol.  Limit alcohol intake to no more than 1 drink per day for  nonpregnant women. One drink equals 12 ounces of beer, 5 ounces of wine, or 1 ounces of hard liquor.  Do not use street drugs.  Do not share needles.  Ask your health care provider for help if you need support or information about quitting drugs.  Tell your health care provider if you often feel depressed.  Tell your health care provider if you have ever been abused or do not feel safe at home.   This information is not intended to replace advice given to you by your health care provider. Make sure you discuss any questions you have with your health care provider.   Document Released: 07/27/2010 Document Revised: 02/01/2014 Document Reviewed: 12/13/2012 Elsevier Interactive Patient Education [employer].

## 2015-07-30 NOTE — Progress Notes (Signed)
BP 130/86 mmHg  Pulse 92  Temp(Src) 97.5 F (36.4 C) (Oral)  Wt 240 lb 8 oz (109.09 kg)   CC: CPE  Subjective:    Patient ID: Lisa Osborne, female    DOB: 12/13/1940, 75 y.o.   MRN: [id number]  HPI: Lisa Osborne is a 75 y.o. female presenting on 07/30/2015 for Annual Exam   Saw Lisa Osborne last week for medicare wellness visit, note reviewed. Pt would like to increase zoloft. More nervous, depressed since trial decrease 02/2015.   Multiple falls - slipped down carpeted stairs twice. Injured R foot. She has started slowing down. No other falls since then.    Noticing increasing dizziness recently. Seems to be upon first standing.   Preventative: Colon cancer s/p lap LAR 03/2012 - clinical remission followed by Dr Benay Spice ONC. COLONOSCOPY Date: 03/2013 2 polyps, mild diverticulosis, rpt 3 yrs Lisa Osborne) Well woman - s/p total hysterectomy 1985, ovaries removed as well.  Mammogram 12/216 Birads1 DEXA - done in [place] and normal per patient.  Flu - 12/2013 Tetanus - 2010 Pneumovax 2008, prevnar 2015 zostavax - considering - financial constraints Advanced directive: updated, will bring copy. HCPOA would be daughter Lisa Osborne. Full code, but no prolonged life support if terminal condition Seat belt use discussed Sunscreen use discussed. No changing moles on skin.   Caffeine: 1 cup coffee/day Lives alone. 1 daughter Lisa Osborne), 3 stillborns Occupation: retired, was Chief Technology Officer. Activity: walking and swimming. Diet: good fruits/vegetables, more fruit/vegetable smoothies good amt water  Relevant past medical, surgical, family and social history reviewed and updated as indicated. Interim medical history since our last visit reviewed. Allergies and medications reviewed and updated. Current Outpatient Prescriptions on File Prior to Visit  Medication Sig  . Calcium Citrate-Vitamin D (CALCIUM CITRATE + D PO) Take 1 tablet by mouth daily.   . Coenzyme Q10 (COQ10) 200 MG  CAPS Take 1 capsule by mouth daily.   Marland Kitchen levothyroxine (SYNTHROID, LEVOTHROID) 25 MCG tablet TAKE ONE TABLET BY MOUTH DAILY  . loratadine (CLARITIN) 10 MG tablet Take 10 mg by mouth daily as needed for allergies. Reported on 03/17/2015  . Melatonin 5 MG TABS Take 5 mg by mouth at bedtime.   . Omega-3 Fatty Acids (FISH OIL) 1000 MG CAPS Take 1 capsule by mouth daily.  . polycarbophil (FIBERCON) 625 MG tablet Take 625 mg by mouth daily. Not sure of dose  . vitamin B-12 (CYANOCOBALAMIN) 1000 MCG tablet Take 1,000 mcg by mouth daily.   No current facility-administered medications on file prior to visit.    Review of Systems  Constitutional: Negative for fever, chills, activity change, appetite change, fatigue and unexpected weight change.  HENT: Negative for hearing loss.   Eyes: Negative for visual disturbance.  Respiratory: Negative for cough, chest tightness, shortness of breath and wheezing.   Cardiovascular: Negative for chest pain, palpitations and leg swelling.  Gastrointestinal: Negative for nausea, vomiting, abdominal pain, diarrhea, constipation, blood in stool and abdominal distention.  Genitourinary: Negative for hematuria and difficulty urinating.  Musculoskeletal: Negative for myalgias, arthralgias and neck pain.  Skin: Positive for rash.  Neurological: Positive for dizziness. Negative for seizures, syncope and headaches.  Hematological: Negative for adenopathy. Does not bruise/bleed easily.  Psychiatric/Behavioral: Negative for dysphoric mood. The patient is not nervous/anxious.    Per HPI unless specifically indicated in ROS section     Objective:    BP 130/86 mmHg  Pulse 92  Temp(Src) 97.5 F (36.4 C) (Oral)  Wt 240 lb 8  oz (109.09 kg)  Wt Readings from Last 3 Encounters:  07/30/15 240 lb 8 oz (109.09 kg)  07/25/15 240 lb 8 oz (109.09 kg)  04/02/15 231 lb (104.781 kg)   Body mass index is 36.58 kg/(m^2).  Physical Exam  Constitutional: She is oriented to person,  place, and time. She appears well-developed and well-nourished. No distress.  HENT:  Head: Normocephalic and atraumatic.  Right Ear: Hearing, tympanic membrane, external ear and ear canal normal.  Left Ear: Hearing, tympanic membrane, external ear and ear canal normal.  Nose: Nose normal.  Mouth/Throat: Uvula is midline, oropharynx is clear and moist and mucous membranes are normal. No oropharyngeal exudate, posterior oropharyngeal edema or posterior oropharyngeal erythema.  Eyes: Conjunctivae and EOM are normal. Pupils are equal, round, and reactive to light. No scleral icterus.  Neck: Normal range of motion. Neck supple. Carotid bruit is not present. No thyromegaly present.  Cardiovascular: Normal rate, regular rhythm, normal heart sounds and intact distal pulses.   No murmur heard. Pulses:      Radial pulses are 2+ on the right side, and 2+ on the left side.  Pulmonary/Chest: Effort normal and breath sounds normal. No respiratory distress. She has no wheezes. She has no rales.  Abdominal: Soft. Bowel sounds are normal. She exhibits no distension and no mass. There is no tenderness. There is no rebound and no guarding.  Musculoskeletal: Normal range of motion. She exhibits no edema.  Lymphadenopathy:    She has no cervical adenopathy.  Neurological: She is alert and oriented to person, place, and time.  CN grossly intact, station and gait intact  Skin: Skin is warm and dry. No rash noted.  SKs throughout skin  Psychiatric: She has a normal mood and affect. Her behavior is normal. Judgment and thought content normal.  Nursing note and vitals reviewed.  Lab Results  Component Value Date   HGBA1C 6.1* 03/19/2015       Assessment & Plan:   Problem List Items Addressed This Visit    MDD (major depressive disorder), recurrent episode, moderate ([place])    Not doing as well on lower sertraline dose. Will return to prior 100mg  dose.       Relevant Medications   sertraline (ZOLOFT) 100 MG  tablet   Diabetes mellitus type 2, controlled ([place])    Chronic, diet controlled Last visit levels improved to prediabetes - recheck today.       Hypothyroidism    Continue low dose levothyroxine. Check TSH today.       Colon adenocarcinoma - rectosigmoid T3N1 (1/15) s/p lap LAR 04/14/2012    Appreciate onc care - continues seeing Q6 mo      Health maintenance examination - Primary    Preventative protocols reviewed and updated unless pt declined. Discussed healthy diet and lifestyle.       Advanced care planning/counseling discussion    Advanced directive: updated, will bring copy. HCPOA would be daughter Lisa Osborne. Full code, but no prolonged life support if terminal condition      Obesity, Class II, BMI 35-39.9, with comorbidity ([place])    Discussed healthy diet and lifestyle changes to affect sustainable weight loss.           Follow up plan: Return in about 6 months (around 01/30/2016) for follow up visit.  Ria Bush, MD

## 2015-07-30 NOTE — Progress Notes (Signed)
Pre visit review using our clinic review tool, if applicable. No additional management support is needed unless otherwise documented below in the visit note. 

## 2015-07-30 NOTE — Assessment & Plan Note (Signed)
Advanced directive: updated, will bring copy. HCPOA would be daughter Lisa Osborne. Full code, but no prolonged life support if terminal condition 

## 2015-08-03 ENCOUNTER — Encounter: Payer: Self-pay | Admitting: Family Medicine

## 2015-08-03 DIAGNOSIS — E785 Hyperlipidemia, unspecified: Secondary | ICD-10-CM

## 2015-08-03 HISTORY — DX: Hyperlipidemia, unspecified: E78.5

## 2015-08-11 ENCOUNTER — Encounter: Payer: Self-pay | Admitting: Nurse Practitioner

## 2015-08-11 ENCOUNTER — Other Ambulatory Visit (HOSPITAL_BASED_OUTPATIENT_CLINIC_OR_DEPARTMENT_OTHER): Payer: Medicare Other

## 2015-08-11 ENCOUNTER — Telehealth: Payer: Self-pay | Admitting: Oncology

## 2015-08-11 ENCOUNTER — Ambulatory Visit (HOSPITAL_BASED_OUTPATIENT_CLINIC_OR_DEPARTMENT_OTHER): Payer: Medicare Other | Admitting: Nurse Practitioner

## 2015-08-11 VITALS — BP 141/75 | HR 86 | Temp 98.8°F | Resp 18 | Ht 68.0 in | Wt 241.8 lb

## 2015-08-11 DIAGNOSIS — Z85038 Personal history of other malignant neoplasm of large intestine: Secondary | ICD-10-CM

## 2015-08-11 DIAGNOSIS — D508 Other iron deficiency anemias: Secondary | ICD-10-CM

## 2015-08-11 DIAGNOSIS — C189 Malignant neoplasm of colon, unspecified: Secondary | ICD-10-CM

## 2015-08-11 DIAGNOSIS — R42 Dizziness and giddiness: Secondary | ICD-10-CM

## 2015-08-11 LAB — BASIC METABOLIC PANEL
ANION GAP: 12 meq/L — AB (ref 3–11)
BUN: 18 mg/dL (ref 7.0–26.0)
CALCIUM: 9 mg/dL (ref 8.4–10.4)
CO2: 25 mEq/L (ref 22–29)
Chloride: 103 mEq/L (ref 98–109)
Creatinine: 1.2 mg/dL — ABNORMAL HIGH (ref 0.6–1.1)
EGFR: 46 mL/min/{1.73_m2} — AB (ref >90)
Glucose: 185 mg/dl — ABNORMAL HIGH (ref 70–140)
POTASSIUM: 3.9 meq/L (ref 3.5–5.1)
SODIUM: 140 meq/L (ref 136–145)

## 2015-08-11 LAB — CBC WITH DIFFERENTIAL/PLATELET
BASO%: 0.5 % (ref 0.0–2.0)
Basophils Absolute: 0 10*3/uL (ref 0.0–0.1)
EOS%: 0.5 % (ref 0.0–7.0)
Eosinophils Absolute: 0 10*3/uL (ref 0.0–0.5)
HEMATOCRIT: 36.9 % (ref 34.8–46.6)
HEMOGLOBIN: 12.4 g/dL (ref 11.6–15.9)
LYMPH#: 1.4 10*3/uL (ref 0.9–3.3)
LYMPH%: 30 % (ref 14.0–49.7)
MCH: 31.5 pg (ref 25.1–34.0)
MCHC: 33.5 g/dL (ref 31.5–36.0)
MCV: 94 fL (ref 79.5–101.0)
MONO#: 0.3 10*3/uL (ref 0.1–0.9)
MONO%: 7.3 % (ref 0.0–14.0)
NEUT#: 2.8 10*3/uL (ref 1.5–6.5)
NEUT%: 61.7 % (ref 38.4–76.8)
PLATELETS: 186 10*3/uL (ref 145–400)
RBC: 3.93 10*6/uL (ref 3.70–5.45)
RDW: 13.3 % (ref 11.2–14.5)
WBC: 4.5 10*3/uL (ref 3.9–10.3)

## 2015-08-11 NOTE — Progress Notes (Signed)
  [place] OFFICE PROGRESS NOTE   Diagnosis:  Colon cancer  INTERVAL HISTORY:   Lisa Osborne returns as scheduled. She overall feels well. Bowels moving regularly. No bloody or black stools. No abdominal pain. She has a good appetite. Zoloft dose was recently increased. She has noted intermittent dizziness with position change since the dose increase. No unusual headaches. No vision change.  Objective:  Vital signs in last 24 hours:  Blood pressure 141/75, pulse 86, temperature 98.8 F (37.1 C), temperature source Oral, resp. rate 18, height '5\' 8"'$  (1.727 m), weight 241 lb 12.8 oz (109.68 kg), SpO2 98 %.    HEENT: No thrush or ulcers. Lymphatics: No palpable cervical, supra clavicular, axillary or inguinal lymph nodes. Resp: Lungs clear bilaterally. Cardio: Regular rate and rhythm. GI: Abdomen soft and nontender. No hepatomegaly. Vascular: No leg edema. Neuro: Alert and oriented.    Lab Results:  Lab Results  Component Value Date   WBC 4.5 08/11/2015   HGB 12.4 08/11/2015   HCT 36.9 08/11/2015   MCV 94.0 08/11/2015   PLT 186 08/11/2015   NEUTROABS 2.8 08/11/2015    Imaging:  No results found.  Medications: I have reviewed the patient's current medications.  Assessment/Plan: 1. Moderately differentiated adenocarcinoma of the rectosigmoid colon, microsatellite stable, stage IIIB (T4 N1) status post low anterior resection on 04/14/2012. She completed cycle 1 adjuvant Xeloda 05/15/2012. Cycle 2 held due to to diarrhea. She completed cycle 2 at a reduced dose (1500 mg twice daily for 14 days) beginning 06/21/2012. She began cycle 3 on 07/12/2012, cycle 4 08/02/2012, cycle 5 08/30/2012, cycle 6 10/03/2012(dose reduced to 1500 mg in the morning and 1000 mg in the evening due to hand-foot syndrome). Cycle 7 10/24/2012. Cycle 8 completed beginning 11/14/2012.  CTs of chest, abdomen, and pelvis 03/05/2013-negative.  Colonoscopy 04/05/2013. 2 sessile polyps  measuring 3 mm in size were found in the descending colon (small tubular adenoma admixed with a fragment of benign colonic mucosa. No high-grade dysplasia or invasive malignancy). Mild diverticulosis noted in the descending colon. Colon otherwise normal. Next colonoscopy in 3 years.  CTs 03/11/2014-negative  CTs 03/06/2015-negative 2. Elevated preoperative CEA. 3. Diabetes. 4. History of anemia. Hemoglobin was lower on 09/09/2014. Oral iron initiated following that visit. Hemoglobin has corrected into the normal range. Stool Hemoccult cards negative, urinalysis negative for blood  5. Palpitations-an EKG reveals a sinus arrhythmia. It was recommended she follow-up with Dr. Danise Mina to evaluate the palpitations   Disposition: Lisa Osborne remains in clinical remission from colon cancer. We will follow-up on the CEA from today. She will return for a follow-up visit and CEA in 6 months. She will contact the office in the interim with any problems. She will follow-up with Dr. Danise Mina regarding the dizziness.    Ned Card ANP/GNP-BC   08/11/2015  11:12 AM

## 2015-08-11 NOTE — Telephone Encounter (Signed)
Gave pt cal & avs °

## 2015-08-12 ENCOUNTER — Telehealth: Payer: Self-pay | Admitting: *Deleted

## 2015-08-12 LAB — CEA (PARALLEL TESTING): CEA: 1.5 ng/mL

## 2015-08-12 LAB — CEA: CEA1: 2 ng/mL (ref 0.0–4.7)

## 2015-08-12 NOTE — Telephone Encounter (Signed)
Lm lab results were within range. Please call the office with any questions.

## 2015-08-12 NOTE — Telephone Encounter (Signed)
-----   Message from Brien Few, RN sent at 08/12/2015 10:50 AM EDT -----   ----- Message -----    From: Ladell Pier, MD    Sent: 08/12/2015   7:35 AM      To: Ar[facility] Pod 2  Please call patient CEA is normal

## 2015-08-27 ENCOUNTER — Encounter: Payer: Medicare Other | Admitting: Family Medicine

## 2015-12-01 LAB — HM DIABETES EYE EXAM

## 2015-12-09 ENCOUNTER — Other Ambulatory Visit: Payer: Self-pay | Admitting: Family Medicine

## 2015-12-09 DIAGNOSIS — Z1231 Encounter for screening mammogram for malignant neoplasm of breast: Secondary | ICD-10-CM

## 2015-12-24 ENCOUNTER — Encounter: Payer: Self-pay | Admitting: *Deleted

## 2015-12-30 ENCOUNTER — Encounter: Payer: Self-pay | Admitting: *Deleted

## 2016-01-14 ENCOUNTER — Ambulatory Visit
Admission: RE | Admit: 2016-01-14 | Discharge: 2016-01-14 | Disposition: A | Discharge status: Home / Self Care | Payer: Medicare Other | Source: Ambulatory Visit | Attending: Family Medicine | Admitting: Family Medicine

## 2016-01-14 DIAGNOSIS — Z1231 Encounter for screening mammogram for malignant neoplasm of breast: Secondary | ICD-10-CM

## 2016-01-14 LAB — HM MAMMOGRAPHY

## 2016-01-15 ENCOUNTER — Encounter: Payer: Self-pay | Admitting: *Deleted

## 2016-02-05 ENCOUNTER — Encounter: Payer: Self-pay | Admitting: Gastroenterology

## 2016-02-09 ENCOUNTER — Ambulatory Visit: Payer: Medicare Other | Admitting: Oncology

## 2016-02-09 ENCOUNTER — Telehealth: Payer: Self-pay | Admitting: *Deleted

## 2016-02-09 ENCOUNTER — Other Ambulatory Visit: Payer: Medicare Other

## 2016-02-09 NOTE — Telephone Encounter (Signed)
1027: Left message informing pt office visit will be rescheduled. Message to schedulers to contact pt with new appt.

## 2016-02-12 ENCOUNTER — Telehealth: Payer: Self-pay | Admitting: Oncology

## 2016-02-12 ENCOUNTER — Telehealth: Payer: Self-pay | Admitting: *Deleted

## 2016-02-12 NOTE — Telephone Encounter (Signed)
Spoke with patient re 2/2 lab/fu

## 2016-02-12 NOTE — Telephone Encounter (Signed)
Call from pt requesting to reschedule office visit. Request to schedulers to contact pt.

## 2016-02-27 ENCOUNTER — Other Ambulatory Visit: Payer: Medicare Other

## 2016-02-27 ENCOUNTER — Ambulatory Visit: Payer: Medicare Other | Admitting: Oncology

## 2016-03-12 ENCOUNTER — Ambulatory Visit (HOSPITAL_BASED_OUTPATIENT_CLINIC_OR_DEPARTMENT_OTHER): Payer: Medicare Other | Admitting: Oncology

## 2016-03-12 ENCOUNTER — Telehealth: Payer: Self-pay | Admitting: Oncology

## 2016-03-12 ENCOUNTER — Other Ambulatory Visit (HOSPITAL_BASED_OUTPATIENT_CLINIC_OR_DEPARTMENT_OTHER): Payer: Medicare Other

## 2016-03-12 VITALS — BP 145/79 | HR 72 | Temp 98.1°F | Resp 18 | Ht 68.0 in | Wt 239.7 lb

## 2016-03-12 DIAGNOSIS — I498 Other specified cardiac arrhythmias: Secondary | ICD-10-CM | POA: Diagnosis not present

## 2016-03-12 DIAGNOSIS — C189 Malignant neoplasm of colon, unspecified: Secondary | ICD-10-CM

## 2016-03-12 DIAGNOSIS — Z85038 Personal history of other malignant neoplasm of large intestine: Secondary | ICD-10-CM | POA: Diagnosis not present

## 2016-03-12 LAB — CEA (IN HOUSE-CHCC): CEA (CHCC-IN HOUSE): 1.93 ng/mL (ref 0.00–5.00)

## 2016-03-12 NOTE — Telephone Encounter (Signed)
Appointments scheduled per 03/12/16 los. Patient was given a copy of the AVS report and appointment schedule per 03/12/16 los. °

## 2016-03-12 NOTE — Progress Notes (Signed)
  [place] OFFICE PROGRESS NOTE   Diagnosis: Colon cancer  INTERVAL HISTORY:   Lisa Osborne returns for scheduled visit. She feels well. She reports intentional weight loss with a change in her diet. No difficulty with bowel function. No complaint. She is scheduled for a colonoscopy in 2 weeks.  Objective:  Vital signs in last 24 hours:  Blood pressure (!) 145/79, pulse 72, temperature 98.1 F (36.7 C), temperature source Oral, resp. rate 18, height _0  (1.727 m), weight 239 lb 11.2 oz (108.7 kg), SpO2 95 %.    HEENT: Neck without mass Lymphatics: No cervical, supraclavicular, axillary, or inguinal nodes Resp: Lungs clear bilaterally Cardio: Regular rate and rhythm GI: No hepatosplenomegaly, no mass, nontender Vascular: No leg edema   Lab Results:  Lab Results  Component Value Date   WBC 4.5 08/11/2015   HGB 12.4 08/11/2015   HCT 36.9 08/11/2015   MCV 94.0 08/11/2015   PLT 186 08/11/2015   NEUTROABS 2.8 08/11/2015   CEA-pending  Medications: I have reviewed the patient's current medications.  Assessment/Plan: 1. Moderately differentiated adenocarcinoma of the rectosigmoid colon, microsatellite stable, stage IIIB (T4 N1) status post low anterior resection on 04/14/2012. She completed cycle 1 adjuvant Xeloda 05/15/2012. Cycle 2 held due to to diarrhea. She completed cycle 2 at a reduced dose (1500 mg twice daily for 14 days) beginning 06/21/2012. She began cycle 3 on 07/12/2012, cycle 4 08/02/2012, cycle 5 08/30/2012, cycle 6 10/03/2012(dose reduced to 1500 mg in the morning and 1000 mg in the evening due to hand-foot syndrome). Cycle 7 10/24/2012. Cycle 8 completed beginning 11/14/2012.  CTs of chest, abdomen, and pelvis 03/05/2013-negative.  Colonoscopy 04/05/2013. 2 sessile polyps measuring 3 mm in size were found in the descending colon (small tubular adenoma admixed with a fragment of benign colonic mucosa. No high-grade dysplasia or invasive  malignancy). Mild diverticulosis noted in the descending colon. Colon otherwise normal. Next colonoscopy in 3 years.  CTs 03/11/2014-negative  CTs 03/06/2015-negative 2. Elevated preoperative CEA. 3. Diabetes. 4. History of anemia. Hemoglobin was lower on 09/09/2014. Oral iron initiated following that visit. Hemoglobin has corrected into the normal range. Stool Hemoccult cards negative, urinalysis negative for blood  5. Palpitations-an EKG reveals a sinus arrhythmia. It was recommended she follow-up with Dr. Danise Mina to evaluate the palpitations   Disposition:  [name] remains in clinical remission from colon cancer. We will follow-up on the CEA from today. She will return for an office visit and CEA in 6 months.  15 minutes were spent with the patient today. The majority of the time was used for counseling and coordination of care.  Betsy Coder, MD  03/12/2016  10:35 AM

## 2016-03-13 LAB — CEA: CEA: 2.1 ng/mL (ref 0.0–4.7)

## 2016-03-15 ENCOUNTER — Telehealth: Payer: Self-pay | Admitting: *Deleted

## 2016-03-15 NOTE — Telephone Encounter (Signed)
Notified pt of normal CEA. She voiced understanding. 

## 2016-03-15 NOTE — Telephone Encounter (Signed)
-----   Message from Ladell Pier, MD sent at 03/15/2016  2:10 PM EST ----- Please call patient, cea is normal

## 2016-03-23 ENCOUNTER — Ambulatory Visit (AMBULATORY_SURGERY_CENTER): Payer: Self-pay

## 2016-03-23 VITALS — Ht 69.5 in | Wt 237.8 lb

## 2016-03-23 DIAGNOSIS — Z86010 Personal history of colonic polyps: Secondary | ICD-10-CM

## 2016-03-23 DIAGNOSIS — Z85038 Personal history of other malignant neoplasm of large intestine: Secondary | ICD-10-CM

## 2016-03-23 MED ORDER — NA SULFATE-K SULFATE-MG SULF 17.5-3.13-1.6 GM/177ML PO SOLN: ORAL | 0 refills | Status: DC

## 2016-03-23 NOTE — Progress Notes (Signed)
Per pt, no allergies to soy or egg products.Pt not taking any weight loss meds or using  O2 at home. 

## 2016-03-25 HISTORY — PX: COLONOSCOPY: SHX174

## 2016-03-30 ENCOUNTER — Encounter: Payer: Self-pay | Admitting: Gastroenterology

## 2016-04-06 ENCOUNTER — Encounter: Payer: Medicare Other | Admitting: Gastroenterology

## 2016-04-12 ENCOUNTER — Encounter: Payer: Self-pay | Admitting: Gastroenterology

## 2016-04-12 ENCOUNTER — Ambulatory Visit (AMBULATORY_SURGERY_CENTER): Payer: Medicare Other | Admitting: Gastroenterology

## 2016-04-12 VITALS — BP 131/76 | HR 61 | Temp 97.3°F | Resp 21 | Ht 69.0 in | Wt 237.0 lb

## 2016-04-12 DIAGNOSIS — D122 Benign neoplasm of ascending colon: Secondary | ICD-10-CM

## 2016-04-12 DIAGNOSIS — Z86010 Personal history of colonic polyps: Secondary | ICD-10-CM

## 2016-04-12 DIAGNOSIS — D123 Benign neoplasm of transverse colon: Secondary | ICD-10-CM

## 2016-04-12 MED ORDER — SODIUM CHLORIDE 0.9 % IV SOLN: 500.0000 mL | INTRAVENOUS | Status: DC

## 2016-04-12 NOTE — Progress Notes (Signed)
Report to PACU, RN, vss, BBS= Clear.  

## 2016-04-12 NOTE — Progress Notes (Signed)
Called to room to assist during endoscopic procedure.  Patient ID and intended procedure confirmed with present staff. Received instructions for my participation in the procedure from the performing physician.  

## 2016-04-12 NOTE — Patient Instructions (Signed)
YOU HAD AN ENDOSCOPIC PROCEDURE TODAY AT [facility]:   Refer to the procedure report that was given to you for any specific questions about what was found during the examination.  If the procedure report does not answer your questions, please call your gastroenterologist to clarify.  If you requested that your care partner not be given the details of your procedure findings, then the procedure report has been included in a sealed envelope for you to review at your convenience later.  YOU SHOULD EXPECT: Some feelings of bloating in the abdomen. Passage of more gas than usual.  Walking can help get rid of the air that was put into your GI tract during the procedure and reduce the bloating. If you had a lower endoscopy (such as a colonoscopy or flexible sigmoidoscopy) you may notice spotting of blood in your stool or on the toilet paper. If you underwent a bowel prep for your procedure, you may not have a normal bowel movement for a few days.  Please Note:  You might notice some irritation and congestion in your nose or some drainage.  This is from the oxygen used during your procedure.  There is no need for concern and it should clear up in a day or so.  SYMPTOMS TO REPORT IMMEDIATELY:   Following lower endoscopy (colonoscopy or flexible sigmoidoscopy):  Excessive amounts of blood in the stool  Significant tenderness or worsening of abdominal pains  Swelling of the abdomen that is new, acute  Fever of 100F or higher    For urgent or emergent issues, a gastroenterologist can be reached at any hour by calling [phone].   DIET:  We do recommend a small meal at first, but then you may proceed to your regular diet.  Drink plenty of fluids but you should avoid alcoholic beverages for 24 hours.  ACTIVITY:  You should plan to take it easy for the rest of today and you should NOT DRIVE or use heavy machinery until tomorrow (because of the sedation medicines used during the test).     FOLLOW UP: Our staff will call the number listed on your records the next business day following your procedure to check on you and address any questions or concerns that you may have regarding the information given to you following your procedure. If we do not reach you, we will leave a message.  However, if you are feeling well and you are not experiencing any problems, there is no need to return our call.  We will assume that you have returned to your regular daily activities without incident.  If any biopsies were taken you will be contacted by phone or by letter within the next 1-3 weeks.  Please call us at [phone] if you have not heard about the biopsies in 3 weeks.   Await for biopsy results to determined next repeat Colonoscopy screening Polyps (handout given) No Ibuprofen, Naproxen, or other non- steriodal anti-inflammatory drugs for 2 weeks after polyps removal. Recommendation Colonoscopy within 6-12 months given inability to clear the cecum on this examination.  SIGNATURES/CONFIDENTIALITY: You and/or your care partner have signed paperwork which will be entered into your electronic medical record.  These signatures attest to the fact that that the information above on your After Visit Summary has been reviewed and is understood.  Full responsibility of the confidentiality of this discharge information lies with you and/or your care-partner.

## 2016-04-12 NOTE — Op Note (Signed)
[facility] Patient Name: Lisa Osborne Procedure Date: 04/12/2016 11:11 AM MRN: [id number] Endoscopist: Remo Lipps P. Khayden Herzberg MD, MD Age: 76 Referring MD:  Date of Birth: Dec 28, 1940 Gender: Female Account #: [id number] Procedure:                Colonoscopy Indications:              High risk colon cancer surveillance: Personal                            history of colon cancer Medicines:                Monitored Anesthesia Care Procedure:                Pre-Anesthesia Assessment:                           - Prior to the procedure, a History and Physical                            was performed, and patient medications and                            allergies were reviewed. The patient's tolerance of                            previous anesthesia was also reviewed. The risks                            and benefits of the procedure and the sedation                            options and risks were discussed with the patient.                            All questions were answered, and informed consent                            was obtained. Prior Anticoagulants: The patient has                            taken no previous anticoagulant or antiplatelet                            agents. ASA Grade Assessment: II - A patient with                            mild systemic disease. After reviewing the risks                            and benefits, the patient was deemed in                            satisfactory condition to undergo the procedure.  After obtaining informed consent, the colonoscope                            was passed under direct vision. Throughout the                            procedure, the patient's blood pressure, pulse, and                            oxygen saturations were monitored continuously. The                            Colonoscope was introduced through the anus and                            advanced to the the cecum,  identified by                            appendiceal orifice and ileocecal valve. The                            colonoscopy was performed without difficulty. The                            patient tolerated the procedure well. The quality                            of the bowel preparation was fair. The ileocecal                            valve, appendiceal orifice, and rectum were                            photographed. Scope In: 11:29:55 AM Scope Out: 11:46:27 AM Scope Withdrawal Time: 0 hours 13 minutes 14 seconds  Total Procedure Duration: 0 hours 16 minutes 32 seconds  Findings:                 The perianal and digital rectal examinations were                            normal.                           A moderate amount of semi-liquid stool was found in                            the cecum, making visualization difficult. It could                            not be lavaged (clogged endoscope) to clear the                            cecum.  Two sessile polyps were found in the ascending                            colon. The polyps were 4 to 6 mm in size. These                            polyps were removed with a cold snare. Resection                            and retrieval were complete.                           A 5 mm polyp was found in the hepatic flexure. The                            polyp was sessile. The polyp was removed with a                            cold snare. Resection and retrieval were complete.                           Two sessile polyps were found in the transverse                            colon. The polyps were 4 to 5 mm in size. These                            polyps were removed with a cold snare. Resection                            and retrieval were complete.                           There was evidence of a prior end-to-end                            colo-colonic anastomosis in the sigmoid colon. This                             was patent and was characterized by healthy                            appearing mucosa.                           The exam was otherwise without abnormality on                            direct and retroflexion views. Complications:            No immediate complications. Estimated blood loss:                            Minimal. Estimated Blood  Loss:     Estimated blood loss was minimal. Impression:               - Preparation of the colon was fair.                           - Stool in the cecum, could not entirely clear the                            cecum of flat of small polyps.                           - Two 4 to 6 mm polyps in the ascending colon,                            removed with a cold snare. Resected and retrieved.                           - One 5 mm polyp at the hepatic flexure, removed                            with a cold snare. Resected and retrieved.                           - Two 4 to 5 mm polyps in the transverse colon,                            removed with a cold snare. Resected and retrieved.                           - Patent end-to-end colo-colonic anastomosis,                            characterized by healthy appearing mucosa.                           - The examination was otherwise normal on direct                            and retroflexion views. Recommendation:           - Patient has a contact number available for                            emergencies. The signs and symptoms of potential                            delayed complications were discussed with the                            patient. Return to normal activities tomorrow.                            Written discharge instructions were provided to the  patient.                           - Resume previous diet.                           - Continue present medications.                           - No ibuprofen, naproxen, or other non-steroidal                             anti-inflammatory drugs for 2 weeks after polyp                            removal.                           - Await pathology results.                           - Repeat colonoscopy is recommended for                            surveillance. I would recommend a colonoscopy                            within 6-12 months given inability to clear the                            cecum on this examination. Remo Lipps P. [place] Hanlon MD, MD 04/12/2016 11:53:32 AM This report has been signed electronically.

## 2016-04-13 ENCOUNTER — Telehealth: Payer: Self-pay | Admitting: *Deleted

## 2016-04-13 NOTE — Telephone Encounter (Signed)
  Follow up Call-  Call back number 04/12/2016  Post procedure Call Back phone  # [phone]  Permission to leave phone message Yes  Some recent data might be hidden     Patient questions:  Do you have a fever, pain , or abdominal swelling? No. Pain Score  0 *  Have you tolerated food without any problems? Yes.    Have you been able to return to your normal activities? Yes.    Do you have any questions about your discharge instructions: Diet   No. Medications  No. Follow up visit  No.  Do you have questions or concerns about your Care? No.  Actions: * If pain score is 4 or above: No action needed, pain <4.

## 2016-04-15 ENCOUNTER — Encounter: Payer: Self-pay | Admitting: Gastroenterology

## 2016-04-26 ENCOUNTER — Encounter: Payer: Self-pay | Admitting: Family Medicine

## 2016-05-20 ENCOUNTER — Telehealth: Payer: Self-pay | Admitting: Family Medicine

## 2016-05-20 ENCOUNTER — Other Ambulatory Visit: Payer: Self-pay | Admitting: Family Medicine

## 2016-05-20 NOTE — Telephone Encounter (Signed)
Left pt message asking to call Ebony Hail back directly at [phone] to schedule AWV.+ labs with Katha Cabal and CPE with PCP. *please sch after these dates*  Last AWV 07/25/15 Last CPE 07/30/15

## 2016-06-10 ENCOUNTER — Ambulatory Visit (INDEPENDENT_AMBULATORY_CARE_PROVIDER_SITE_OTHER): Payer: Medicare Other | Admitting: Family Medicine

## 2016-06-10 ENCOUNTER — Encounter: Payer: Self-pay | Admitting: Family Medicine

## 2016-06-10 VITALS — BP 122/80 | HR 62 | Temp 98.4°F | Resp 18 | Wt 238.0 lb

## 2016-06-10 DIAGNOSIS — J209 Acute bronchitis, unspecified: Secondary | ICD-10-CM | POA: Diagnosis not present

## 2016-06-10 MED ORDER — GUAIFENESIN-CODEINE 100-10 MG/5ML PO SYRP: 5.0000 mL | ORAL_SOLUTION | Freq: Every evening | ORAL | 0 refills | Status: DC | PRN

## 2016-06-10 MED ORDER — AZITHROMYCIN 250 MG PO TABS: ORAL_TABLET | ORAL | 0 refills | Status: DC

## 2016-06-10 NOTE — Assessment & Plan Note (Signed)
Anticipate viral given short duration. Discussed supportive care. cheratussin for cough at night time.  zpack to hold on to and reviewed indications on when to fill.  Pt agrees with plan.

## 2016-06-10 NOTE — Patient Instructions (Signed)
You have a bronchitis, likely viral.  Antibiotics are not needed for this. Viral infections usually take 7-10 days to resolve.  The cough can last a few weeks to go away. Use medication as prescribed: cheratussin for cough at night time. Mucinex with plenty of water during the day.  Push fluids and plenty of rest. If fever >101, worsening productive cough, fill antibiotic provided today (zpack) Call clinic with questions.  Good to see you today. I hope you start feeling better soon.

## 2016-06-10 NOTE — Progress Notes (Signed)
BP 122/80 (BP Location: Left Arm, Patient Position: Sitting, Cuff Size: Large)   Pulse 62   Temp 98.4 F (36.9 C) (Oral)   Resp 18   Wt 238 lb (108 kg)   SpO2 94%   BMI 35.15 kg/m    CC: cough Subjective:    Patient ID: Lisa Osborne, female    DOB: 07-04-1940, 76 y.o.   MRN: [id number]  HPI: Lisa Osborne is a 76 y.o. female presenting on 06/10/2016 for Cough (Started over a week ago. Coughing up alot of stuff. Vertigo is worse. Has not tried anything OTC)   4d h/o bad cough with wheezing, productive of large amts phlegm, worsened reflux over last 2 nights. + chills, L earache, chest congestion.   No fevers, tooth pain,  No sick contacts at home.  No h/o asthma or COPD.  Hasn't tried anything for this yet.   Several week h/o worsening vertigo described as room spinning with standing.   Relevant past medical, surgical, family and social history reviewed and updated as indicated. Interim medical history since our last visit reviewed. Allergies and medications reviewed and updated. Outpatient Medications Prior to Visit  Medication Sig Dispense Refill  . Calcium Citrate-Vitamin D (CALCIUM CITRATE + D PO) Take 1 tablet by mouth daily.     . Coenzyme Q10 (COQ10) 200 MG CAPS Take 1 capsule by mouth daily.     . Ferrous Sulfate (IRON) 325 (65 Fe) MG TABS Take by mouth daily.    . lansoprazole (PREVACID) 15 MG capsule TAKE ONE CAPSULE BY MOUTH EVERY DAY 90 capsule 3  . levothyroxine (SYNTHROID, LEVOTHROID) 25 MCG tablet TAKE ONE TABLET BY MOUTH DAILY 90 tablet 0  . loratadine (CLARITIN) 10 MG tablet Take 10 mg by mouth daily as needed for allergies. Reported on 03/17/2015    . Melatonin 5 MG TABS Take 5 mg by mouth at bedtime.     . Omega-3 Fatty Acids (FISH OIL) 1000 MG CAPS Take 1 capsule by mouth daily.    . polycarbophil (FIBERCON) 625 MG tablet Take 625 mg by mouth daily. Not sure of dose    . sertraline (ZOLOFT) 100 MG tablet Take 1 tablet (100 mg total) by  mouth daily. 90 tablet 3  . vitamin B-12 (CYANOCOBALAMIN) 1000 MCG tablet Take 1,000 mcg by mouth daily.    Marland Kitchen ibuprofen (ADVIL,MOTRIN) 400 MG tablet Take 400 mg by mouth every 4 (four) hours as needed. for pain  0  . [facility] NON-ASPIRIN EXTRA STRENGTH 500 MG tablet Take 1 tablet by mouth every 4 (four) hours as needed.  0   Facility-Administered Medications Prior to Visit  Medication Dose Route Frequency Provider Last Rate Last Dose  . 0.9 %  sodium chloride infusion  500 mL Intravenous Continuous Armbruster, Renelda Loma, MD         Per HPI unless specifically indicated in ROS section below Review of Systems     Objective:    BP 122/80 (BP Location: Left Arm, Patient Position: Sitting, Cuff Size: Large)   Pulse 62   Temp 98.4 F (36.9 C) (Oral)   Resp 18   Wt 238 lb (108 kg)   SpO2 94%   BMI 35.15 kg/m   Wt Readings from Last 3 Encounters:  06/10/16 238 lb (108 kg)  04/12/16 237 lb (107.5 kg)  03/23/16 237 lb 12.8 oz (107.9 kg)    Physical Exam  Constitutional: She appears well-developed and well-nourished. No distress.  HENT:  Head: Normocephalic  and atraumatic.  Right Ear: Hearing, tympanic membrane, external ear and ear canal normal.  Left Ear: Hearing, tympanic membrane, external ear and ear canal normal.  Nose: No mucosal edema or rhinorrhea. Right sinus exhibits no maxillary sinus tenderness and no frontal sinus tenderness. Left sinus exhibits no maxillary sinus tenderness and no frontal sinus tenderness.  Mouth/Throat: Uvula is midline, oropharynx is clear and moist and mucous membranes are normal. No oropharyngeal exudate, posterior oropharyngeal edema, posterior oropharyngeal erythema or tonsillar abscesses.  Eyes: Conjunctivae and EOM are normal. Pupils are equal, round, and reactive to light. No scleral icterus.  Neck: Normal range of motion. Neck supple.  Cardiovascular: Normal rate, regular rhythm, normal heart sounds and intact distal pulses.   No murmur  heard. Pulmonary/Chest: Effort normal and breath sounds normal. No respiratory distress. She has no wheezes. She has no rales.  Lungs overall clear  Lymphadenopathy:    She has no cervical adenopathy.  Skin: Skin is warm and dry. No rash noted.  Nursing note and vitals reviewed.     Assessment & Plan:   Problem List Items Addressed This Visit    Acute bronchitis - Primary    Anticipate viral given short duration. Discussed supportive care. cheratussin for cough at night time.  zpack to hold on to and reviewed indications on when to fill.  Pt agrees with plan.           Follow up plan: Return if symptoms worsen or fail to improve.  Ria Bush, MD

## 2016-06-15 ENCOUNTER — Other Ambulatory Visit: Payer: Self-pay | Admitting: Family Medicine

## 2016-06-15 DIAGNOSIS — E1122 Type 2 diabetes mellitus with diabetic chronic kidney disease: Secondary | ICD-10-CM

## 2016-06-15 DIAGNOSIS — C189 Malignant neoplasm of colon, unspecified: Secondary | ICD-10-CM

## 2016-06-15 DIAGNOSIS — N183 Chronic kidney disease, stage 3 (moderate): Secondary | ICD-10-CM

## 2016-06-15 DIAGNOSIS — E785 Hyperlipidemia, unspecified: Secondary | ICD-10-CM

## 2016-06-15 DIAGNOSIS — E039 Hypothyroidism, unspecified: Secondary | ICD-10-CM

## 2016-06-15 DIAGNOSIS — E119 Type 2 diabetes mellitus without complications: Secondary | ICD-10-CM

## 2016-06-17 ENCOUNTER — Ambulatory Visit (INDEPENDENT_AMBULATORY_CARE_PROVIDER_SITE_OTHER): Payer: Medicare Other

## 2016-06-17 VITALS — BP 124/82 | HR 63 | Temp 98.4°F | Ht 68.0 in | Wt 237.5 lb

## 2016-06-17 DIAGNOSIS — N183 Chronic kidney disease, stage 3 unspecified: Secondary | ICD-10-CM

## 2016-06-17 DIAGNOSIS — C189 Malignant neoplasm of colon, unspecified: Secondary | ICD-10-CM

## 2016-06-17 DIAGNOSIS — E039 Hypothyroidism, unspecified: Secondary | ICD-10-CM

## 2016-06-17 DIAGNOSIS — E119 Type 2 diabetes mellitus without complications: Secondary | ICD-10-CM | POA: Diagnosis not present

## 2016-06-17 DIAGNOSIS — E785 Hyperlipidemia, unspecified: Secondary | ICD-10-CM | POA: Diagnosis not present

## 2016-06-17 DIAGNOSIS — E1122 Type 2 diabetes mellitus with diabetic chronic kidney disease: Secondary | ICD-10-CM | POA: Diagnosis not present

## 2016-06-17 DIAGNOSIS — Z0000 Encounter for general adult medical examination without abnormal findings: Secondary | ICD-10-CM | POA: Diagnosis not present

## 2016-06-17 LAB — CBC WITH DIFFERENTIAL/PLATELET
BASOS ABS: 0 10*3/uL (ref 0.0–0.1)
Basophils Relative: 0.5 % (ref 0.0–3.0)
Eosinophils Absolute: 0 10*3/uL (ref 0.0–0.7)
Eosinophils Relative: 0.6 % (ref 0.0–5.0)
HCT: 37.2 % (ref 36.0–46.0)
Hemoglobin: 12.4 g/dL (ref 12.0–15.0)
LYMPHS ABS: 2.4 10*3/uL (ref 0.7–4.0)
Lymphocytes Relative: 42.5 % (ref 12.0–46.0)
MCHC: 33.4 g/dL (ref 30.0–36.0)
MCV: 95.6 fl (ref 78.0–100.0)
MONO ABS: 0.5 10*3/uL (ref 0.1–1.0)
MONOS PCT: 9.6 % (ref 3.0–12.0)
NEUTROS ABS: 2.6 10*3/uL (ref 1.4–7.7)
NEUTROS PCT: 46.8 % (ref 43.0–77.0)
PLATELETS: 194 10*3/uL (ref 150.0–400.0)
RBC: 3.9 Mil/uL (ref 3.87–5.11)
RDW: 13.5 % (ref 11.5–15.5)
WBC: 5.6 10*3/uL (ref 4.0–10.5)

## 2016-06-17 LAB — LIPID PANEL
Cholesterol: 208 mg/dL — ABNORMAL HIGH (ref 0–200)
HDL: 54.1 mg/dL (ref >39.00)
LDL Cholesterol: 115 mg/dL — ABNORMAL HIGH (ref 0–99)
NONHDL: 154.1
Total CHOL/HDL Ratio: 4
Triglycerides: 194 mg/dL — ABNORMAL HIGH (ref 0.0–149.0)
VLDL: 38.8 mg/dL (ref 0.0–40.0)

## 2016-06-17 LAB — RENAL FUNCTION PANEL
Albumin: 4.2 g/dL (ref 3.5–5.2)
BUN: 14 mg/dL (ref 6–23)
CALCIUM: 8.7 mg/dL (ref 8.4–10.5)
CO2: 30 meq/L (ref 19–32)
Chloride: 101 mEq/L (ref 96–112)
Creatinine, Ser: 1.06 mg/dL (ref 0.40–1.20)
GFR: 53.79 mL/min — ABNORMAL LOW (ref >60.00)
Glucose, Bld: 155 mg/dL — ABNORMAL HIGH (ref 70–99)
Phosphorus: 3.1 mg/dL (ref 2.3–4.6)
Potassium: 4 mEq/L (ref 3.5–5.1)
SODIUM: 139 meq/L (ref 135–145)

## 2016-06-17 LAB — MICROALBUMIN / CREATININE URINE RATIO
CREATININE, U: 261.7 mg/dL
MICROALB/CREAT RATIO: 1.5 mg/g (ref 0.0–30.0)
Microalb, Ur: 4 mg/dL — ABNORMAL HIGH (ref 0.0–1.9)

## 2016-06-17 LAB — HEMOGLOBIN A1C: Hgb A1c MFr Bld: 6.6 % — ABNORMAL HIGH (ref 4.6–6.5)

## 2016-06-17 LAB — TSH: TSH: 3.98 u[IU]/mL (ref 0.35–4.50)

## 2016-06-17 NOTE — Progress Notes (Signed)
Pre visit review using our clinic review tool, if applicable. No additional management support is needed unless otherwise documented below in the visit note. 

## 2016-06-17 NOTE — Patient Instructions (Addendum)
Lisa Osborne , Thank you for taking time to come for your Medicare Wellness Visit. I appreciate your ongoing commitment to your health goals. Please review the following plan we discussed and let me know if I can assist you in the future.   These are the goals we discussed: Goals    . Increase physical activity          Starting 06/17/2016, I continue to walk at least 30 min 2-3 days per week.        This is a list of the screening recommended for you and due dates:  Health Maintenance  Topic Date Due  . Complete foot exam   06/24/2016*  . DTaP/Tdap/Td vaccine (1 - Tdap) 08/23/2018*  . Flu Shot  08/25/2016  . Eye exam for diabetics  11/30/2016  . Hemoglobin A1C  12/18/2016  . Mammogram  01/13/2017  . Colon Cancer Screening  04/12/2017  . Urine Protein Check  06/17/2017  . Tetanus Vaccine  08/23/2018  . DEXA scan (bone density measurement)  Completed  . Pneumonia vaccines  Completed  *Topic was postponed. The date shown is not the original due date.   Preventive Care for Adults  A healthy lifestyle and preventive care can promote health and wellness. Preventive health guidelines for adults include the following key practices.  . A routine yearly physical is a good way to check with your health care provider about your health and preventive screening. It is a chance to share any concerns and updates on your health and to receive a thorough exam.  . Visit your dentist for a routine exam and preventive care every 6 months. Brush your teeth twice a day and floss once a day. Good oral hygiene prevents tooth decay and gum disease.  . The frequency of eye exams is based on your age, health, family medical history, use  of contact lenses, and other factors. Follow your health care provider's ecommendations for frequency of eye exams.  . Eat a healthy diet. Foods like vegetables, fruits, whole grains, low-fat dairy products, and lean protein foods contain the nutrients you need without  too many calories. Decrease your intake of foods high in solid fats, added sugars, and salt. Eat the right amount of calories for you. Get information about a proper diet from your health care provider, if necessary.  . Regular physical exercise is one of the most important things you can do for your health. Most adults should get at least 150 minutes of moderate-intensity exercise (any activity that increases your heart rate and causes you to sweat) each week. In addition, most adults need muscle-strengthening exercises on 2 or more days a week.  Silver Sneakers may be a benefit available to you. To determine eligibility, you may visit the website: www.silversneakers.com or contact program at [phone] Mon-Fri between 8AM-8PM.   . Maintain a healthy weight. The body mass index (BMI) is a screening tool to identify possible weight problems. It provides an estimate of body fat based on height and weight. Your health care provider can find your BMI and can help you achieve or maintain a healthy weight.   For adults 20 years and older: ? A BMI below 18.5 is considered underweight. ? A BMI of 18.5 to 24.9 is normal. ? A BMI of 25 to 29.9 is considered overweight. ? A BMI of 30 and above is considered obese.   . Maintain normal blood lipids and cholesterol levels by exercising and minimizing your intake of saturated fat.  Eat a balanced diet with plenty of fruit and vegetables. Blood tests for lipids and cholesterol should begin at age 17 and be repeated every 5 years. If your lipid or cholesterol levels are high, you are over 50, or you are at high risk for heart disease, you may need your cholesterol levels checked more frequently. Ongoing high lipid and cholesterol levels should be treated with medicines if diet and exercise are not working.  . If you smoke, find out from your health care provider how to quit. If you do not use tobacco, please do not start.  . If you choose to drink alcohol,  please do not consume more than 2 drinks per day. One drink is considered to be 12 ounces (355 mL) of beer, 5 ounces (148 mL) of wine, or 1.5 ounces (44 mL) of liquor.  . If you are 58-108 years old, ask your health care provider if you should take aspirin to prevent strokes.  . Use sunscreen. Apply sunscreen liberally and repeatedly throughout the day. You should seek shade when your shadow is shorter than you. Protect yourself by wearing long sleeves, pants, a wide-brimmed hat, and sunglasses year round, whenever you are outdoors.  . Once a month, do a whole body skin exam, using a mirror to look at the skin on your back. Tell your health care provider of new moles, moles that have irregular borders, moles that are larger than a pencil eraser, or moles that have changed in shape or color.

## 2016-06-17 NOTE — Progress Notes (Signed)
Subjective:   Lisa Osborne is a 76 y.o. female who presents for Medicare Annual (Subsequent) preventive examination.  Review of Systems:  N/A Cardiac Risk Factors include: advanced age (>52mn, >>29women);obesity (BMI >30kg/m2);diabetes mellitus;dyslipidemia     Objective:     Vitals: BP 124/82 (BP Location: Right Arm, Patient Position: Sitting, Cuff Size: Large)   Pulse 63   Temp 98.4 F (36.9 C) (Oral)   Ht 5' 8"  (1.727 m)   Wt 237 lb 8 oz (107.7 kg)   SpO2 96%   BMI 36.11 kg/m   Body mass index is 36.11 kg/m.   Tobacco History  Smoking Status  . Former Smoker  . Quit date: 04/06/1967  Smokeless Tobacco  . Never Used     Counseling given: No   Past Medical History:  Diagnosis Date  . Abnormal EKG    incomplete RBBB  . Acute cholecystitis   . Anemia   . Arthritis    left side lower back pain  . Colon adenocarcinoma - rectosigmoid T3N1 (1/15) s/p lap LAR 04/14/2012   Status post laparoscopic lower anterior resection  . Dehydration 02/2015  . Depression    severe in past  . Diabetes mellitus type 2, controlled ([place] 2006   diet controlled  . GERD (gastroesophageal reflux disease)    bad if off PPI  . History of chicken pox   . History of iron deficiency anemia   . HLD (hyperlipidemia) 08/03/2015  . Hypothyroidism    mild  . Rosacea   . Seasonal allergies   . Unilateral deafnesses    left ear deaf, wears hearing aid right side  . Urine incontinence    mild, wears pad   Past Surgical History:  Procedure Laterality Date  . ABDOMINOPLASTY  1996  . CHOLECYSTECTOMY N/A 03/20/2015   Procedure: LAPAROSCOPIC CHOLECYSTECTOMY;  Surgeon: RFlorene Glen MD;  Location: [facility] ORS;  Service: General;  Laterality: N/A;  . COLONOSCOPY  08/2006   normal  . COLONOSCOPY  03/2013   2 polyps, mild diverticulosis, rpt 3 yrs ([name]  . COLONOSCOPY  03/2016   5 TAs, fair prep, patent anastomosis, rpt 6-12 mo (Armbruster)  . FACELIFT  05/2011  . INCONTINENCE  SURGERY  1996  . LAPAROSCOPIC LOW ANTERIOR RESECTION N/A 04/14/2012   colon cancer at rectosigmoid junction s/p rigid proctoscopy splenic flexure mobilization lysis of adhesion;  Surgeon: SAdin Hector MD;   . MOUTH SURGERY     crown repair  . TOTAL VAGINAL HYSTERECTOMY  1985   cancer cells on cervix   Family History  Problem Relation Age of Onset  . Breast cancer Mother 645      breast cancer then multiple myeloma  . Cancer Mother        breast  . Alcohol abuse Father   . Heart disease Father        heart failure  . Mental illness Brother        2 brothers severe depression, suicide x2  . Diabetes Maternal Aunt   . Diabetes Maternal Uncle   . Diabetes Paternal Uncle   . Diabetes Paternal Grandfather   . Coronary artery disease Neg Hx   . Stroke Neg Hx    History  Sexual Activity  . Sexual activity: No    Outpatient Encounter Prescriptions as of 06/17/2016  Medication Sig  . Calcium Citrate-Vitamin D (CALCIUM CITRATE + D PO) Take 1 tablet by mouth daily.   . Coenzyme Q10 (COQ10) 200 MG  CAPS Take 1 capsule by mouth daily.   . Ferrous Sulfate (IRON) 325 (65 Fe) MG TABS Take by mouth daily.  . lansoprazole (PREVACID) 15 MG capsule TAKE ONE CAPSULE BY MOUTH EVERY DAY  . levothyroxine (SYNTHROID, LEVOTHROID) 25 MCG tablet TAKE ONE TABLET BY MOUTH DAILY  . loratadine (CLARITIN) 10 MG tablet Take 10 mg by mouth daily as needed for allergies. Reported on 03/17/2015  . Melatonin 5 MG TABS Take 5 mg by mouth at bedtime.   . Omega-3 Fatty Acids (FISH OIL) 1000 MG CAPS Take 1 capsule by mouth daily.  . polycarbophil (FIBERCON) 625 MG tablet Take 625 mg by mouth daily. Not sure of dose  . sertraline (ZOLOFT) 100 MG tablet Take 1 tablet (100 mg total) by mouth daily.  . vitamin B-12 (CYANOCOBALAMIN) 1000 MCG tablet Take 1,000 mcg by mouth daily.  . [DISCONTINUED] azithromycin (ZITHROMAX) 250 MG tablet Take two tablets on day one followed by one tablet on days 2-5  . [DISCONTINUED]  guaiFENesin-codeine (CHERATUSSIN AC) 100-10 MG/5ML syrup Take 5 mLs by mouth at bedtime as needed for cough (sedation precautions).   Facility-Administered Encounter Medications as of 06/17/2016  Medication  . 0.9 %  sodium chloride infusion    Activities of Daily Living In your present state of health, do you have any difficulty performing the following activities: 06/17/2016 07/25/2015  Hearing? Y N  Vision? N N  Difficulty concentrating or making decisions? Y N  Walking or climbing stairs? Y N  Dressing or bathing? N N  Doing errands, shopping? N N  Preparing Food and eating ? N N  Using the Toilet? N N  In the past six months, have you accidently leaked urine? Y Y  Do you have problems with loss of bowel control? N N  Managing your Medications? N N  Managing your Finances? N N  Housekeeping or managing your Housekeeping? N N  Some recent data might be hidden    Patient Care Team: Ria Bush, MD as PCP - General (Family Medicine) Michael Boston, MD as Consulting Physician (General Surgery) Inda Castle, MD as Consulting Physician (Gastroenterology) Carola Frost, RN as Registered Nurse (Oncology) Ladell Pier, MD as Attending Physician ([place]) Thelma Comp, [place] as Referring Physician (Optometry) Owens Shark, NP as Nurse Practitioner (Hematology and Oncology) Ladell Pier, MD as Consulting Physician (Oncology)    Assessment:     Hearing Screening   125Hz  250Hz  500Hz  1000Hz  2000Hz  3000Hz  4000Hz  6000Hz  8000Hz   Right ear:   40 0 40  40    Left ear:           Comments: Left ear - deaf  Vision Screening Comments: Last vision exam in Nov 2017   Exercise Activities and Dietary recommendations Current Exercise Habits: Home exercise routine, Type of exercise: walking, Time (Minutes): 30, Frequency (Times/Week): 3, Weekly Exercise (Minutes/Week): 90, Intensity: Moderate, Exercise limited by: None identified  Goals    . Increase physical  activity          Starting 06/17/2016, I continue to walk at least 30 min 2-3 days per week.       Fall Risk Fall Risk  06/17/2016 07/25/2015 06/11/2014 09/07/2013  Falls in the past year? Yes Yes Yes No  Number falls in past yr: 1 2 or more 1 -  Injury with Fall? Yes Yes Yes -  Follow up - Falls evaluation completed;Falls prevention discussed;Education provided - -   Depression Screen PHQ 2/9 Scores 06/17/2016  07/25/2015 06/11/2014  PHQ - 2 Score 0 0 0     Cognitive Function MMSE - Mini Mental State Exam 06/17/2016 07/25/2015  Orientation to time 5 5  Orientation to Place 5 5  Registration 3 3  Attention/ Calculation 0 0  Recall 3 3  Language- name 2 objects 0 0  Language- repeat 1 1  Language- follow 3 step command 3 3  Language- read & follow direction 0 0  Write a sentence 0 0  Copy design 0 0  Total score 20 20     PLEASE NOTE: A Mini-Cog screen was completed. Maximum score is 20. A value of 0 denotes this part of Folstein MMSE was not completed or the patient failed this part of the Mini-Cog screening.   Mini-Cog Screening Orientation to Time - Max 5 pts Orientation to Place - Max 5 pts Registration - Max 3 pts Recall - Max 3 pts Language Repeat - Max 1 pts Language Follow 3 Step Command - Max 3 pts     Immunization History  Administered Date(s) Administered  . Influenza Split 11/26/2010  . Influenza, High Dose Seasonal PF 10/27/2015  . Influenza,inj,Quad PF,36+ Mos 11/09/2012  . Influenza-Unspecified 12/26/2013  . Pneumococcal Conjugate-13 05/22/2013  . Pneumococcal Polysaccharide-23 01/07/2007  . Td 08/22/2008   Screening Tests Health Maintenance  Topic Date Due  . FOOT EXAM  06/24/2016 (Originally 05/23/2014)  . DTaP/Tdap/Td (1 - Tdap) 08/23/2018 (Originally 08/23/2008)  . INFLUENZA VACCINE  08/25/2016  . OPHTHALMOLOGY EXAM  11/30/2016  . HEMOGLOBIN A1C  12/18/2016  . MAMMOGRAM  01/13/2017  . COLONOSCOPY  04/12/2017  . URINE MICROALBUMIN  06/17/2017  .  TETANUS/TDAP  08/23/2018  . DEXA SCAN  Completed  . PNA vac Low Risk Adult  Completed      Plan:     I have personally reviewed and addressed the Medicare Annual Wellness questionnaire and have noted the following in the patient's chart:  A. Medical and social history B. Use of alcohol, tobacco or illicit drugs  C. Current medications and supplements D. Functional ability and status E.  Nutritional status F.  Physical activity G. Advance directives H. List of other physicians I.  Hospitalizations, surgeries, and ER visits in previous 12 months J.  [place] to include hearing, vision, cognitive, depression L. Referrals and appointments - none  In addition, I have reviewed and discussed with patient certain preventive protocols, quality metrics, and best practice recommendations. A written personalized care plan for preventive services as well as general preventive health recommendations were provided to patient.  See attached scanned questionnaire for additional information.   Signed,   Lindell Noe, MHA, BS, LPN Health Coach

## 2016-06-17 NOTE — Progress Notes (Signed)
PCP notes:   Health maintenance:  Foot exam - PCP please address at next appt A1C - completed Urine microalbumin - completed  Abnormal screenings:   Hearing - failed (right ear) Fall risk - hx of fall with injury; no medical treatment  Patient concerns:   None  Nurse concerns:  None  Next PCP appt:   06/24/16 @ 1230

## 2016-06-19 NOTE — Progress Notes (Signed)
I reviewed health advisor's note, was available for consultation, and agree with documentation and plan.  

## 2016-06-24 ENCOUNTER — Ambulatory Visit (INDEPENDENT_AMBULATORY_CARE_PROVIDER_SITE_OTHER): Payer: Medicare Other | Admitting: Family Medicine

## 2016-06-24 ENCOUNTER — Encounter: Payer: Self-pay | Admitting: Family Medicine

## 2016-06-24 NOTE — Progress Notes (Signed)
BP 124/82   Pulse 63   Temp 98.4 F (36.9 C) (Oral)   Ht 5\' 8"  (1.727 m)   Wt 237 lb 8 oz (107.7 kg)   SpO2 96%   BMI 36.11 kg/m    CC: CPE Subjective:    Patient ID: Lisa Osborne, female    DOB: Feb 08, 1940, 76 y.o.   MRN: [id number]  HPI: Lisa Osborne is a 76 y.o. female presenting on 06/24/2016 for Annual Exam Part 2   EARLY FOR CPE, AND SEEMS EARLY FOR AMW LAST WEEK. DISCUSSED WITH PATIENT. SHE WILL RESCHEDULE PHYSICAL FOR July.   Saw Lisa Osborne last week for medicare wellness visit. Note reviewed.    Preventative: Colon cancer s/p lap LAR 03/2012 - clinical remission followed by Dr Lisa Osborne ONC. COLONOSCOPY Date: 03/2013 2 polyps, mild diverticulosis, rpt 3 yrs Lisa Osborne) Well woman - s/p total hysterectomy 1985, ovaries removed as well.  Mammogram 12/216 Birads1 DEXA - done in [place] and normal per patient.  Flu - 12/2013 Tetanus - 2010 Pneumovax 2008, prevnar 2015 zostavax - considering - financial constraints Advanced directive: updated, will bring copy. HCPOA would be daughter Lisa Osborne. Full code, but no prolonged life support if terminal condition Seat belt use discussed Sunscreen use discussed. No changing moles on skin.   Caffeine: 1 cup coffee/day Lives alone. 1 daughter Lisa Osborne), 3 stillborns Occupation: retired, was Chief Technology Officer. Activity: walking and swimming. Diet: good fruits/vegetables, more fruit/vegetable smoothies good amt water  Relevant past medical, surgical, family and social history reviewed and updated as indicated. Interim medical history since our last visit reviewed. Allergies and medications reviewed and updated. Outpatient Medications Prior to Visit  Medication Sig Dispense Refill  . Calcium Citrate-Vitamin D (CALCIUM CITRATE + D PO) Take 1 tablet by mouth daily.     . Coenzyme Q10 (COQ10) 200 MG CAPS Take 1 capsule by mouth daily.     . Ferrous Sulfate (IRON) 325 (65 Fe) MG TABS Take by mouth daily.    .  lansoprazole (PREVACID) 15 MG capsule TAKE ONE CAPSULE BY MOUTH EVERY DAY 90 capsule 3  . levothyroxine (SYNTHROID, LEVOTHROID) 25 MCG tablet TAKE ONE TABLET BY MOUTH DAILY 90 tablet 0  . loratadine (CLARITIN) 10 MG tablet Take 10 mg by mouth daily as needed for allergies. Reported on 03/17/2015    . Melatonin 5 MG TABS Take 5 mg by mouth at bedtime.     . Omega-3 Fatty Acids (FISH OIL) 1000 MG CAPS Take 1 capsule by mouth daily.    . polycarbophil (FIBERCON) 625 MG tablet Take 625 mg by mouth daily. Not sure of dose    . sertraline (ZOLOFT) 100 MG tablet Take 1 tablet (100 mg total) by mouth daily. 90 tablet 3  . vitamin B-12 (CYANOCOBALAMIN) 1000 MCG tablet Take 1,000 mcg by mouth daily.     Facility-Administered Medications Prior to Visit  Medication Dose Route Frequency Provider Last Rate Last Dose  . 0.9 %  sodium chloride infusion  500 mL Intravenous Continuous Armbruster, Renelda Loma, MD         Per HPI unless specifically indicated in ROS section below Review of Systems     Objective:    BP 124/82   Pulse 63   Temp 98.4 F (36.9 C) (Oral)   Ht 5\' 8"  (1.727 m)   Wt 237 lb 8 oz (107.7 kg)   SpO2 96%   BMI 36.11 kg/m   Wt Readings from Last 3 Encounters:  06/24/16 237 lb  8 oz (107.7 kg)  06/17/16 237 lb 8 oz (107.7 kg)  06/10/16 238 lb (108 kg)    Physical Exam Results for orders placed or performed in visit on 06/17/16  Lipid panel  Result Value Ref Range   Cholesterol 208 (H) 0 - 200 mg/dL   Triglycerides 194.0 (H) 0.0 - 149.0 mg/dL   HDL 54.10 >39.00 mg/dL   VLDL 38.8 0.0 - 40.0 mg/dL   LDL Cholesterol 115 (H) 0 - 99 mg/dL   Total CHOL/HDL Ratio 4    NonHDL 154.10   Hemoglobin A1c  Result Value Ref Range   Hgb A1c MFr Bld 6.6 (H) 4.6 - 6.5 %  CBC with Differential/Platelet  Result Value Ref Range   WBC 5.6 4.0 - 10.5 K/uL   RBC 3.90 3.87 - 5.11 Mil/uL   Hemoglobin 12.4 12.0 - 15.0 g/dL   HCT 37.2 36.0 - 46.0 %   MCV 95.6 78.0 - 100.0 fl   MCHC 33.4 30.0 -  36.0 g/dL   RDW 13.5 11.5 - 15.5 %   Platelets 194.0 150.0 - 400.0 K/uL   Neutrophils Relative % 46.8 43.0 - 77.0 %   Lymphocytes Relative 42.5 12.0 - 46.0 %   Monocytes Relative 9.6 3.0 - 12.0 %   Eosinophils Relative 0.6 0.0 - 5.0 %   Basophils Relative 0.5 0.0 - 3.0 %   Neutro Abs 2.6 1.4 - 7.7 K/uL   Lymphs Abs 2.4 0.7 - 4.0 K/uL   Monocytes Absolute 0.5 0.1 - 1.0 K/uL   Eosinophils Absolute 0.0 0.0 - 0.7 K/uL   Basophils Absolute 0.0 0.0 - 0.1 K/uL  Renal function panel  Result Value Ref Range   Sodium 139 135 - 145 mEq/L   Potassium 4.0 3.5 - 5.1 mEq/L   Chloride 101 96 - 112 mEq/L   CO2 30 19 - 32 mEq/L   Calcium 8.7 8.4 - 10.5 mg/dL   Albumin 4.2 3.5 - 5.2 g/dL   BUN 14 6 - 23 mg/dL   Creatinine, Ser 1.06 0.40 - 1.20 mg/dL   Glucose, Bld 155 (H) 70 - 99 mg/dL   Phosphorus 3.1 2.3 - 4.6 mg/dL   GFR 53.79 (L) >60.00 mL/min  TSH  Result Value Ref Range   TSH 3.98 0.35 - 4.50 uIU/mL  Microalbumin/Creatinine Ratio, Urine  Result Value Ref Range   Microalb, Ur 4.0 (H) 0.0 - 1.9 mg/dL   Creatinine,U 261.7 mg/dL   Microalb Creat Ratio 1.5 0.0 - 30.0 mg/g      Assessment & Plan:  Lisa Osborne was seen today for annual exam part 2.  Diagnoses and all orders for this visit:  Erroneous encounter - disregard   EARLY FOR CPE, AND SEEMS EARLY FOR AMW LAST WEEK. DISCUSSED WITH PATIENT. SHE WILL RESCHEDULE PHYSICAL FOR July.   She will let us know if she gets charged for AMW - to credit.  No charge visit today.   LABS REVEIWED WITH PATIENT.  Follow up plan: No Follow-up on file.  Ria Bush, MD

## 2016-06-24 NOTE — Patient Instructions (Signed)
Let's postpone physical until after 07/29/2016. Schedule up front.

## 2016-06-25 NOTE — Telephone Encounter (Signed)
Seen [phone] 

## 2016-07-22 ENCOUNTER — Other Ambulatory Visit: Payer: Self-pay | Admitting: Family Medicine

## 2016-07-30 ENCOUNTER — Encounter: Payer: Self-pay | Admitting: Family Medicine

## 2016-07-30 ENCOUNTER — Ambulatory Visit (INDEPENDENT_AMBULATORY_CARE_PROVIDER_SITE_OTHER): Payer: Medicare Other | Admitting: Family Medicine

## 2016-07-30 VITALS — BP 122/82 | HR 73 | Temp 98.0°F | Ht 68.5 in | Wt 239.0 lb

## 2016-07-30 DIAGNOSIS — E669 Obesity, unspecified: Secondary | ICD-10-CM

## 2016-07-30 DIAGNOSIS — C189 Malignant neoplasm of colon, unspecified: Secondary | ICD-10-CM

## 2016-07-30 DIAGNOSIS — E785 Hyperlipidemia, unspecified: Secondary | ICD-10-CM | POA: Diagnosis not present

## 2016-07-30 DIAGNOSIS — Z0000 Encounter for general adult medical examination without abnormal findings: Secondary | ICD-10-CM

## 2016-07-30 DIAGNOSIS — Z7189 Other specified counseling: Secondary | ICD-10-CM | POA: Diagnosis not present

## 2016-07-30 DIAGNOSIS — F331 Major depressive disorder, recurrent, moderate: Secondary | ICD-10-CM

## 2016-07-30 DIAGNOSIS — E039 Hypothyroidism, unspecified: Secondary | ICD-10-CM | POA: Diagnosis not present

## 2016-07-30 DIAGNOSIS — E119 Type 2 diabetes mellitus without complications: Secondary | ICD-10-CM | POA: Diagnosis not present

## 2016-07-30 DIAGNOSIS — IMO0001 Reserved for inherently not codable concepts without codable children: Secondary | ICD-10-CM

## 2016-07-30 NOTE — Assessment & Plan Note (Signed)
Chronic, stable. Continue current regimen. 

## 2016-07-30 NOTE — Assessment & Plan Note (Signed)
Discussed healthy diet choices to maintain good lipid control.

## 2016-07-30 NOTE — Assessment & Plan Note (Signed)
Preventative protocols reviewed and updated unless pt declined. Discussed healthy diet and lifestyle.  

## 2016-07-30 NOTE — Assessment & Plan Note (Signed)
Encouraged weight loss through healthy diet choices.

## 2016-07-30 NOTE — Assessment & Plan Note (Signed)
Chronic, diet controlled. Continue to monitor.

## 2016-07-30 NOTE — Assessment & Plan Note (Signed)
Advanced directive: updated, will bring copy. HCPOA would be daughter Lisa Osborne. Full code, but no prolonged life support if terminal condition

## 2016-07-30 NOTE — Assessment & Plan Note (Signed)
Continue sertraline 100mg  daily. Consider augmentation with adjuvant medication for winter months given h/o SAD.

## 2016-07-30 NOTE — Assessment & Plan Note (Signed)
Followed by GI and onc regularly. May be released from onc care next year (5 yrs from cancer).

## 2016-07-30 NOTE — Patient Instructions (Addendum)
If interested, ask pharmacy about new 2 shot shingles series (shingrix).  For seasonal affective disorder - we could consider adding second mood medicine around winter months.  Bring me copy of your living will.  You are doing well today Return as needed or in 1 year for next wellness visit with Katha Cabal and physical with me.   Health Maintenance, Female Adopting a healthy lifestyle and getting preventive care can go a long way to promote health and wellness. Talk with your health care provider about what schedule of regular examinations is right for you. This is a good chance for you to check in with your provider about disease prevention and staying healthy. In between checkups, there are plenty of things you can do on your own. Experts have done a lot of research about which lifestyle changes and preventive measures are most likely to keep you healthy. Ask your health care provider for more information. Weight and diet Eat a healthy diet  Be sure to include plenty of vegetables, fruits, low-fat dairy products, and lean protein.  Do not eat a lot of foods high in solid fats, added sugars, or salt.  Get regular exercise. This is one of the most important things you can do for your health. ? Most adults should exercise for at least 150 minutes each week. The exercise should increase your heart rate and make you sweat (moderate-intensity exercise). ? Most adults should also do strengthening exercises at least twice a week. This is in addition to the moderate-intensity exercise.  Maintain a healthy weight  Body mass index (BMI) is a measurement that can be used to identify possible weight problems. It estimates body fat based on height and weight. Your health care provider can help determine your BMI and help you achieve or maintain a healthy weight.  For females 63 years of age and older: ? A BMI below 18.5 is considered underweight. ? A BMI of 18.5 to 24.9 is normal. ? A BMI of 25 to 29.9 is  considered overweight. ? A BMI of 30 and above is considered obese.  Watch levels of cholesterol and blood lipids  You should start having your blood tested for lipids and cholesterol at 75 years of age, then have this test every 5 years.  You may need to have your cholesterol levels checked more often if: ? Your lipid or cholesterol levels are high. ? You are older than 75 years of age. ? You are at high risk for heart disease.  Cancer screening Lung Cancer  Lung cancer screening is recommended for adults 58-58 years old who are at high risk for lung cancer because of a history of smoking.  A yearly low-dose CT scan of the lungs is recommended for people who: ? Currently smoke. ? Have quit within the past 15 years. ? Have at least a 30-pack-year history of smoking. A pack year is smoking an average of one pack of cigarettes a day for 1 year.  Yearly screening should continue until it has been 15 years since you quit.  Yearly screening should stop if you develop a health problem that would prevent you from having lung cancer treatment.  Breast Cancer  Practice breast self-awareness. This means understanding how your breasts normally appear and feel.  It also means doing regular breast self-exams. Let your health care provider know about any changes, no matter how small.  If you are in your 20s or 30s, you should have a clinical breast exam (CBE) by a  health care provider every 1-3 years as part of a regular health exam.  If you are 63 or older, have a CBE every year. Also consider having a breast X-ray (mammogram) every year.  If you have a family history of breast cancer, talk to your health care provider about genetic screening.  If you are at high risk for breast cancer, talk to your health care provider about having an MRI and a mammogram every year.  Breast cancer gene (BRCA) assessment is recommended for women who have family members with BRCA-related cancers.  BRCA-related cancers include: ? Breast. ? Ovarian. ? Tubal. ? Peritoneal cancers.  Results of the assessment will determine the need for genetic counseling and BRCA1 and BRCA2 testing.  Cervical Cancer Your health care provider may recommend that you be screened regularly for cancer of the pelvic organs (ovaries, uterus, and vagina). This screening involves a pelvic examination, including checking for microscopic changes to the surface of your cervix (Pap test). You may be encouraged to have this screening done every 3 years, beginning at age 12.  For women ages 41-65, health care providers may recommend pelvic exams and Pap testing every 3 years, or they may recommend the Pap and pelvic exam, combined with testing for human papilloma virus (HPV), every 5 years. Some types of HPV increase your risk of cervical cancer. Testing for HPV may also be done on women of any age with unclear Pap test results.  Other health care providers may not recommend any screening for nonpregnant women who are considered low risk for pelvic cancer and who do not have symptoms. Ask your health care provider if a screening pelvic exam is right for you.  If you have had past treatment for cervical cancer or a condition that could lead to cancer, you need Pap tests and screening for cancer for at least 20 years after your treatment. If Pap tests have been discontinued, your risk factors (such as having a new sexual partner) need to be reassessed to determine if screening should resume. Some women have medical problems that increase the chance of getting cervical cancer. In these cases, your health care provider may recommend more frequent screening and Pap tests.  Colorectal Cancer  This type of cancer can be detected and often prevented.  Routine colorectal cancer screening usually begins at 75 years of age and continues through 75 years of age.  Your health care provider may recommend screening at an earlier age if  you have risk factors for colon cancer.  Your health care provider may also recommend using home test kits to check for hidden blood in the stool.  A small camera at the end of a tube can be used to examine your colon directly (sigmoidoscopy or colonoscopy). This is done to check for the earliest forms of colorectal cancer.  Routine screening usually begins at age 20.  Direct examination of the colon should be repeated every 5-10 years through 75 years of age. However, you may need to be screened more often if early forms of precancerous polyps or small growths are found.  Skin Cancer  Check your skin from head to toe regularly.  Tell your health care provider about any new moles or changes in moles, especially if there is a change in a mole's shape or color.  Also tell your health care provider if you have a mole that is larger than the size of a pencil eraser.  Always use sunscreen. Apply sunscreen liberally and repeatedly throughout  the day.  Protect yourself by wearing long sleeves, pants, a wide-brimmed hat, and sunglasses whenever you are outside.  Heart disease, diabetes, and high blood pressure  High blood pressure causes heart disease and increases the risk of stroke. High blood pressure is more likely to develop in: ? People who have blood pressure in the high end of the normal range (130-139/85-89 mm Hg). ? People who are overweight or obese. ? People who are African American.  If you are 90-31 years of age, have your blood pressure checked every 3-5 years. If you are 79 years of age or older, have your blood pressure checked every year. You should have your blood pressure measured twice-once when you are at a hospital or clinic, and once when you are not at a hospital or clinic. Record the average of the two measurements. To check your blood pressure when you are not at a hospital or clinic, you can use: ? An automated blood pressure machine at a pharmacy. ? A home blood  pressure monitor.  If you are between 61 years and 42 years old, ask your health care provider if you should take aspirin to prevent strokes.  Have regular diabetes screenings. This involves taking a blood sample to check your fasting blood sugar level. ? If you are at a normal weight and have a low risk for diabetes, have this test once every three years after 75 years of age. ? If you are overweight and have a high risk for diabetes, consider being tested at a younger age or more often. Preventing infection Hepatitis B  If you have a higher risk for hepatitis B, you should be screened for this virus. You are considered at high risk for hepatitis B if: ? You were born in a country where hepatitis B is common. Ask your health care provider which countries are considered high risk. ? Your parents were born in a high-risk country, and you have not been immunized against hepatitis B (hepatitis B vaccine). ? You have HIV or AIDS. ? You use needles to inject street drugs. ? You live with someone who has hepatitis B. ? You have had sex with someone who has hepatitis B. ? You get hemodialysis treatment. ? You take certain medicines for conditions, including cancer, organ transplantation, and autoimmune conditions.  Hepatitis C  Blood testing is recommended for: ? Everyone born from [birth year] through 1964. ? Anyone with known risk factors for hepatitis C.  Sexually transmitted infections (STIs)  You should be screened for sexually transmitted infections (STIs) including gonorrhea and chlamydia if: ? You are sexually active and are younger than 75 years of age. ? You are older than 75 years of age and your health care provider tells you that you are at risk for this type of infection. ? Your sexual activity has changed since you were last screened and you are at an increased risk for chlamydia or gonorrhea. Ask your health care provider if you are at risk.  If you do not have HIV, but are at risk,  it may be recommended that you take a prescription medicine daily to prevent HIV infection. This is called pre-exposure prophylaxis (PrEP). You are considered at risk if: ? You are sexually active and do not regularly use condoms or know the HIV status of your partner(s). ? You take drugs by injection. ? You are sexually active with a partner who has HIV.  Talk with your health care provider about whether you are at  high risk of being infected with HIV. If you choose to begin PrEP, you should first be tested for HIV. You should then be tested every 3 months for as long as you are taking PrEP. Pregnancy  If you are premenopausal and you may become pregnant, ask your health care provider about preconception counseling.  If you may become pregnant, take 400 to 800 micrograms (mcg) of folic acid every day.  If you want to prevent pregnancy, talk to your health care provider about birth control (contraception). Osteoporosis and menopause  Osteoporosis is a disease in which the bones lose minerals and strength with aging. This can result in serious bone fractures. Your risk for osteoporosis can be identified using a bone density scan.  If you are 82 years of age or older, or if you are at risk for osteoporosis and fractures, ask your health care provider if you should be screened.  Ask your health care provider whether you should take a calcium or vitamin D supplement to lower your risk for osteoporosis.  Menopause may have certain physical symptoms and risks.  Hormone replacement therapy may reduce some of these symptoms and risks. Talk to your health care provider about whether hormone replacement therapy is right for you. Follow these instructions at home:  Schedule regular health, dental, and eye exams.  Stay current with your immunizations.  Do not use any tobacco products including cigarettes, chewing tobacco, or electronic cigarettes.  If you are pregnant, do not drink  alcohol.  If you are breastfeeding, limit how much and how often you drink alcohol.  Limit alcohol intake to no more than 1 drink per day for nonpregnant women. One drink equals 12 ounces of beer, 5 ounces of wine, or 1 ounces of hard liquor.  Do not use street drugs.  Do not share needles.  Ask your health care provider for help if you need support or information about quitting drugs.  Tell your health care provider if you often feel depressed.  Tell your health care provider if you have ever been abused or do not feel safe at home. This information is not intended to replace advice given to you by your health care provider. Make sure you discuss any questions you have with your health care provider. Document Released: 07/27/2010 Document Revised: 06/19/2015 Document Reviewed: 10/15/2014 Elsevier Interactive Patient Education  [employer].

## 2016-07-30 NOTE — Progress Notes (Signed)
BP 122/82   Pulse 73   Temp 98 F (36.7 C) (Oral)   Ht 5' 8.5" (1.74 m)   Wt 239 lb (108.4 kg)   SpO2 94%   BMI 35.81 kg/m    CC: AMW Subjective:    Patient ID: Lisa Osborne, female    DOB: 1940-05-23, 76 y.o.   MRN: [id number]  HPI: Lisa Osborne is a 76 y.o. female presenting on 07/30/2016 for Annual Wellness Visit (Pt here for AWV. )   Clarnce Flock Katha Cabal 05/2016 for medicare wellness visit. Note reviewed.  Caring for 9yo grandson   Preventative: Colon cancer s/p lap LAR 03/2012 - clinical remission followed by Dr Benay Spice ONC. COLONOSCOPY Date: 03/2013 2 polyps, mild diverticulosis, rpt 3 yrs [name]) COLONOSCOPY 03/2016 5 TAs, fair prep, patent anastomosis, rpt 6-12 mo (Armbruster) Well woman - s/p total hysterectomy 1985, ovaries removed as well.  Mammogram 12/2015 Birads1 DEXA - done ~2005 in [place] and normal per patient.  Flu yearly Tetanus - 2010 Pneumovax 2008, prevnar 2015 Shingrix - discussed.  Advanced directive: updated, will bring copy. HCPOA would be daughter Juliene Pina. Full code, but no prolonged life support if terminal condition Seat belt use discussed Sunscreen use discussed. No changing moles on skin.  Ex smoker quit 1969 Alcohol - 1 shot/wk  Caffeine: 1 cup coffee/day Lives alone. 1 daughter [name]), 3 stillborns Occupation: retired, was Chief Technology Officer. Activity: walking and swimming. Diet: good fruits/vegetables, more fruit/vegetable smoothies good amt water  Relevant past medical, surgical, family and social history reviewed and updated as indicated. Interim medical history since our last visit reviewed. Allergies and medications reviewed and updated. Outpatient Medications Prior to Visit  Medication Sig Dispense Refill  . Calcium Citrate-Vitamin D (CALCIUM CITRATE + D PO) Take 1 tablet by mouth daily.     . Coenzyme Q10 (COQ10) 200 MG CAPS Take 1 capsule by mouth daily.     . Ferrous Sulfate (IRON) 325 (65 Fe) MG TABS Take by  mouth daily.    . lansoprazole (PREVACID) 15 MG capsule TAKE ONE CAPSULE BY MOUTH EVERY DAY 90 capsule 3  . levothyroxine (SYNTHROID, LEVOTHROID) 25 MCG tablet TAKE ONE TABLET BY MOUTH DAILY 90 tablet 0  . loratadine (CLARITIN) 10 MG tablet Take 10 mg by mouth daily as needed for allergies. Reported on 03/17/2015    . Melatonin 5 MG TABS Take 5 mg by mouth at bedtime.     . Omega-3 Fatty Acids (FISH OIL) 1000 MG CAPS Take 1 capsule by mouth daily.    . polycarbophil (FIBERCON) 625 MG tablet Take 625 mg by mouth daily. Not sure of dose    . sertraline (ZOLOFT) 100 MG tablet TAKE 1 TABLET (100 MG TOTAL) BY MOUTH DAILY. 90 tablet 3  . vitamin B-12 (CYANOCOBALAMIN) 1000 MCG tablet Take 1,000 mcg by mouth daily.     Facility-Administered Medications Prior to Visit  Medication Dose Route Frequency Provider Last Rate Last Dose  . 0.9 %  sodium chloride infusion  500 mL Intravenous Continuous Armbruster, Renelda Loma, MD         Per HPI unless specifically indicated in ROS section below Review of Systems  Constitutional: Positive for fatigue. Negative for activity change, appetite change, chills, fever and unexpected weight change.  HENT: Negative for hearing loss.   Eyes: Negative for visual disturbance.  Respiratory: Negative for cough, chest tightness, shortness of breath and wheezing.   Cardiovascular: Negative for chest pain, palpitations and leg swelling.  Gastrointestinal: Negative for  abdominal distention, abdominal pain, blood in stool, constipation, diarrhea, nausea and vomiting.  Genitourinary: Negative for difficulty urinating and hematuria.  Musculoskeletal: Negative for arthralgias, myalgias and neck pain.  Skin: Negative for rash.  Neurological: Positive for dizziness (ongoing vertigo with transitions, worse to right). Negative for seizures, syncope and headaches.  Hematological: Negative for adenopathy. Does not bruise/bleed easily.  Psychiatric/Behavioral: Positive for dysphoric  mood (SAD). The patient is not nervous/anxious.        Objective:    BP 122/82   Pulse 73   Temp 98 F (36.7 C) (Oral)   Ht 5' 8.5" (1.74 m)   Wt 239 lb (108.4 kg)   SpO2 94%   BMI 35.81 kg/m   Wt Readings from Last 3 Encounters:  07/30/16 239 lb (108.4 kg)  06/24/16 237 lb 8 oz (107.7 kg)  06/17/16 237 lb 8 oz (107.7 kg)    Physical Exam  Constitutional: She is oriented to person, place, and time. She appears well-developed and well-nourished. No distress.  HENT:  Head: Normocephalic and atraumatic.  Right Ear: Hearing, tympanic membrane, external ear and ear canal normal.  Left Ear: Hearing, tympanic membrane, external ear and ear canal normal.  Nose: Nose normal.  Mouth/Throat: Uvula is midline, oropharynx is clear and moist and mucous membranes are normal. No oropharyngeal exudate, posterior oropharyngeal edema or posterior oropharyngeal erythema.  Eyes: Conjunctivae and EOM are normal. Pupils are equal, round, and reactive to light. No scleral icterus.  Neck: Normal range of motion. Neck supple. Carotid bruit is not present. No thyromegaly present.  Cardiovascular: Normal rate, regular rhythm, normal heart sounds and intact distal pulses.   No murmur heard. Pulses:      Radial pulses are 2+ on the right side, and 2+ on the left side.  Pulmonary/Chest: Effort normal and breath sounds normal. No respiratory distress. She has no wheezes. She has no rales.  Abdominal: Soft. Bowel sounds are normal. She exhibits no distension and no mass. There is no tenderness. There is no rebound and no guarding.  Musculoskeletal: Normal range of motion. She exhibits no edema.  Lymphadenopathy:    She has no cervical adenopathy.  Neurological: She is alert and oriented to person, place, and time.  CN grossly intact, station and gait intact  Skin: Skin is warm and dry. No rash noted.  Psychiatric: She has a normal mood and affect. Her behavior is normal. Judgment and thought content normal.   Nursing note and vitals reviewed.  Results for orders placed or performed in visit on 06/17/16  Lipid panel  Result Value Ref Range   Cholesterol 208 (H) 0 - 200 mg/dL   Triglycerides 194.0 (H) 0.0 - 149.0 mg/dL   HDL 54.10 >39.00 mg/dL   VLDL 38.8 0.0 - 40.0 mg/dL   LDL Cholesterol 115 (H) 0 - 99 mg/dL   Total CHOL/HDL Ratio 4    NonHDL 154.10   Hemoglobin A1c  Result Value Ref Range   Hgb A1c MFr Bld 6.6 (H) 4.6 - 6.5 %  CBC with Differential/Platelet  Result Value Ref Range   WBC 5.6 4.0 - 10.5 K/uL   RBC 3.90 3.87 - 5.11 Mil/uL   Hemoglobin 12.4 12.0 - 15.0 g/dL   HCT 37.2 36.0 - 46.0 %   MCV 95.6 78.0 - 100.0 fl   MCHC 33.4 30.0 - 36.0 g/dL   RDW 13.5 11.5 - 15.5 %   Platelets 194.0 150.0 - 400.0 K/uL   Neutrophils Relative % 46.8 43.0 - 77.0 %  Lymphocytes Relative 42.5 12.0 - 46.0 %   Monocytes Relative 9.6 3.0 - 12.0 %   Eosinophils Relative 0.6 0.0 - 5.0 %   Basophils Relative 0.5 0.0 - 3.0 %   Neutro Abs 2.6 1.4 - 7.7 K/uL   Lymphs Abs 2.4 0.7 - 4.0 K/uL   Monocytes Absolute 0.5 0.1 - 1.0 K/uL   Eosinophils Absolute 0.0 0.0 - 0.7 K/uL   Basophils Absolute 0.0 0.0 - 0.1 K/uL  Renal function panel  Result Value Ref Range   Sodium 139 135 - 145 mEq/L   Potassium 4.0 3.5 - 5.1 mEq/L   Chloride 101 96 - 112 mEq/L   CO2 30 19 - 32 mEq/L   Calcium 8.7 8.4 - 10.5 mg/dL   Albumin 4.2 3.5 - 5.2 g/dL   BUN 14 6 - 23 mg/dL   Creatinine, Ser 1.06 0.40 - 1.20 mg/dL   Glucose, Bld 155 (H) 70 - 99 mg/dL   Phosphorus 3.1 2.3 - 4.6 mg/dL   GFR 53.79 (L) >60.00 mL/min  TSH  Result Value Ref Range   TSH 3.98 0.35 - 4.50 uIU/mL  Microalbumin/Creatinine Ratio, Urine  Result Value Ref Range   Microalb, Ur 4.0 (H) 0.0 - 1.9 mg/dL   Creatinine,U 261.7 mg/dL   Microalb Creat Ratio 1.5 0.0 - 30.0 mg/g      Assessment & Plan:   Problem List Items Addressed This Visit    Advanced care planning/counseling discussion    Advanced directive: updated, will bring copy. HCPOA  would be daughter Juliene Pina. Full code, but no prolonged life support if terminal condition      Colon adenocarcinoma - rectosigmoid T3N1 (1/15) s/p lap LAR 04/14/2012    Followed by GI and onc regularly. May be released from onc care next year (5 yrs from cancer).       Diabetes mellitus type 2, controlled ([place])    Chronic, diet controlled. Continue to monitor.       Health maintenance examination - Primary    Preventative protocols reviewed and updated unless pt declined. Discussed healthy diet and lifestyle.       HLD (hyperlipidemia)    Discussed healthy diet choices to maintain good lipid control.      Hypothyroidism    Chronic, stable. Continue current regimen.       MDD (major depressive disorder), recurrent episode, moderate ([facility])    Continue sertraline 100mg  daily. Consider augmentation with adjuvant medication for winter months given h/o SAD.       Obesity, Class II, BMI 35-39.9, with comorbidity    Encouraged weight loss through healthy diet choices.          Follow up plan: Return in about 1 year (around 07/30/2017) for annual exam, prior fasting for blood work, medicare wellness visit.  Ria Bush, MD

## 2016-08-16 ENCOUNTER — Other Ambulatory Visit: Payer: Self-pay | Admitting: Family Medicine

## 2016-09-10 ENCOUNTER — Other Ambulatory Visit: Payer: Medicare Other

## 2016-09-10 ENCOUNTER — Ambulatory Visit: Payer: Medicare Other | Admitting: Nurse Practitioner

## 2016-09-28 ENCOUNTER — Other Ambulatory Visit (HOSPITAL_BASED_OUTPATIENT_CLINIC_OR_DEPARTMENT_OTHER): Payer: Medicare Other

## 2016-09-28 ENCOUNTER — Ambulatory Visit (HOSPITAL_BASED_OUTPATIENT_CLINIC_OR_DEPARTMENT_OTHER): Payer: Medicare Other | Admitting: Nurse Practitioner

## 2016-09-28 ENCOUNTER — Telehealth: Payer: Self-pay | Admitting: Nurse Practitioner

## 2016-09-28 VITALS — BP 151/62 | HR 98 | Temp 98.4°F | Resp 18 | Ht 68.5 in | Wt 239.6 lb

## 2016-09-28 DIAGNOSIS — Z85038 Personal history of other malignant neoplasm of large intestine: Secondary | ICD-10-CM

## 2016-09-28 DIAGNOSIS — C189 Malignant neoplasm of colon, unspecified: Secondary | ICD-10-CM

## 2016-09-28 LAB — CEA (IN HOUSE-CHCC): CEA (CHCC-In House): 2.26 ng/mL (ref 0.00–5.00)

## 2016-09-28 NOTE — Progress Notes (Signed)
  [facility] OFFICE PROGRESS NOTE   Diagnosis:  Colon cancer  INTERVAL HISTORY:   Lisa Osborne returns as scheduled. She feels well. No change in bowel habits. No bleeding. Abdominal pain. No nausea. She has a good appetite.  Objective:  Vital signs in last 24 hours:  Blood pressure (!) 151/62, pulse 98, temperature 98.4 F (36.9 C), temperature source Oral, resp. rate 18, height 5' 8.5" (1.74 m), weight 239 lb 9.6 oz (108.7 kg), SpO2 98 %.    HEENT: Neck without mass. Lymphatics: No palpable cervical, supraclavicular, axillary or inguinal lymph nodes. Resp: Lungs clear bilaterally. Cardio: Regular rate and rhythm. GI: Abdomen soft and nontender. No hepatomegaly. No mass. Vascular: No leg edema.    Lab Results:  Lab Results  Component Value Date   WBC 5.6 06/17/2016   HGB 12.4 06/17/2016   HCT 37.2 06/17/2016   MCV 95.6 06/17/2016   PLT 194.0 06/17/2016   NEUTROABS 2.6 06/17/2016    Imaging:  No results found.  Medications: I have reviewed the patient's current medications.  Assessment/Plan: 1. Moderately differentiated adenocarcinoma of the rectosigmoid colon, microsatellite stable, stage IIIB (T4 N1) status post low anterior resection on 04/14/2012. She completed cycle 1 adjuvant Xeloda 05/15/2012. Cycle 2 held due to to diarrhea. She completed cycle 2 at a reduced dose (1500 mg twice daily for 14 days) beginning 06/21/2012. She began cycle 3 on 07/12/2012, cycle 4 08/02/2012, cycle 5 08/30/2012, cycle 6 10/03/2012(dose reduced to 1500 mg in the morning and 1000 mg in the evening due to hand-foot syndrome). Cycle 7 10/24/2012. Cycle 8 completed beginning 11/14/2012.  CTs of chest, abdomen, and pelvis 03/05/2013-negative.  Colonoscopy 04/05/2013. 2 sessile polyps measuring 3 mm in size were found in the descending colon (small tubular adenoma admixed with a fragment of benign colonic mucosa. No high-grade dysplasia or invasive malignancy). Mild  diverticulosis noted in the descending colon. Colon otherwise normal. Next colonoscopy in 3 years.  CTs 03/11/2014-negative  CTs 03/06/2015-negative  Colonoscopy 04/12/2016 sessile polyps ascending colon; 5 mm polyp hepatic flexure. 2 sessile polyps in the transverse colon. Pathology showed tubular adenomas. Next colonoscopy at a one-year interval. 2. Elevated preoperative CEA. 3. Diabetes. 4. History of anemia. Hemoglobin was lower on 09/09/2014. Oral iron initiated following that visit. Hemoglobin has corrected into the normal range. Stool Hemoccult cards negative, urinalysis negative for blood  5. Palpitations-an EKG reveals a sinus arrhythmia. It was recommended she follow-up with Dr. Danise Mina to evaluate the palpitations   Disposition: Lisa Osborne remains in clinical remission from colon cancer. We will follow-up on the CEA from today. She will return for a follow-up visit and CEA in one year. She will contact the office in the interim with any problems.  Plan reviewed with Dr. Benay Spice.    Ned Card ANP/GNP-BC   09/28/2016  10:19 AM

## 2016-09-28 NOTE — Telephone Encounter (Signed)
Scheduled appt per 9/4 los - Gave patient AVS - no calender needed.

## 2016-09-29 ENCOUNTER — Telehealth: Payer: Self-pay | Admitting: *Deleted

## 2016-09-29 LAB — CEA: CEA1: 2.6 ng/mL (ref 0.0–4.7)

## 2016-09-29 NOTE — Telephone Encounter (Signed)
Telephone call to patient to discuss lab results as directed below. Patient verbalized an understanding.

## 2016-09-29 NOTE — Telephone Encounter (Signed)
-----   Message from Owens Shark, NP sent at 09/28/2016 11:27 AM EDT ----- Please let patient know CEA is normal. Follow-up as scheduled.

## 2016-11-08 ENCOUNTER — Encounter: Payer: Self-pay | Admitting: Gastroenterology

## 2016-12-31 ENCOUNTER — Other Ambulatory Visit: Payer: Self-pay

## 2016-12-31 ENCOUNTER — Ambulatory Visit (AMBULATORY_SURGERY_CENTER): Payer: Self-pay | Admitting: *Deleted

## 2016-12-31 VITALS — Ht 69.0 in | Wt 248.0 lb

## 2016-12-31 DIAGNOSIS — Z85038 Personal history of other malignant neoplasm of large intestine: Secondary | ICD-10-CM

## 2016-12-31 MED ORDER — NA SULFATE-K SULFATE-MG SULF 17.5-3.13-1.6 GM/177ML PO SOLN: 1.0000 [IU] | Freq: Once | ORAL | 0 refills | Status: AC

## 2016-12-31 NOTE — Progress Notes (Signed)
No egg or soy allergy known to patient  No issues with past sedation with any surgeries  or procedures, no intubation problems  No diet pills per patient No home 02 use per patient  No blood thinners per patient  Pt denies issues with constipation  No A fib or A flutter  EMMI video sent to pt's e mail pt declined   

## 2017-01-05 ENCOUNTER — Other Ambulatory Visit: Payer: Self-pay | Admitting: Family Medicine

## 2017-01-05 DIAGNOSIS — Z1231 Encounter for screening mammogram for malignant neoplasm of breast: Secondary | ICD-10-CM

## 2017-01-14 ENCOUNTER — Encounter: Payer: Medicare Other | Admitting: Gastroenterology

## 2017-01-25 HISTORY — PX: COLONOSCOPY: SHX174

## 2017-01-28 ENCOUNTER — Encounter: Payer: Medicare Other | Admitting: Gastroenterology

## 2017-02-02 ENCOUNTER — Ambulatory Visit
Admission: RE | Admit: 2017-02-02 | Discharge: 2017-02-02 | Disposition: A | Discharge status: Home / Self Care | Payer: Medicare Other | Source: Ambulatory Visit | Attending: Family Medicine | Admitting: Family Medicine

## 2017-02-02 DIAGNOSIS — Z1231 Encounter for screening mammogram for malignant neoplasm of breast: Secondary | ICD-10-CM | POA: Diagnosis not present

## 2017-02-02 LAB — HM MAMMOGRAPHY

## 2017-02-03 ENCOUNTER — Encounter: Payer: Self-pay | Admitting: Family Medicine

## 2017-02-07 ENCOUNTER — Encounter: Payer: Self-pay | Admitting: Gastroenterology

## 2017-02-07 ENCOUNTER — Encounter: Payer: Self-pay | Admitting: Family Medicine

## 2017-02-07 ENCOUNTER — Ambulatory Visit: Payer: Medicare Other | Admitting: Family Medicine

## 2017-02-07 VITALS — BP 130/78 | HR 80 | Temp 98.0°F | Wt 246.0 lb

## 2017-02-07 DIAGNOSIS — R05 Cough: Secondary | ICD-10-CM | POA: Insufficient documentation

## 2017-02-07 DIAGNOSIS — D229 Melanocytic nevi, unspecified: Secondary | ICD-10-CM | POA: Diagnosis not present

## 2017-02-07 DIAGNOSIS — E119 Type 2 diabetes mellitus without complications: Secondary | ICD-10-CM | POA: Diagnosis not present

## 2017-02-07 DIAGNOSIS — R059 Cough, unspecified: Secondary | ICD-10-CM

## 2017-02-07 MED ORDER — GUAIFENESIN-CODEINE 100-10 MG/5ML PO SYRP: 5.0000 mL | ORAL_SOLUTION | Freq: Two times a day (BID) | ORAL | 0 refills | Status: DC | PRN

## 2017-02-07 NOTE — Assessment & Plan Note (Signed)
Anticipate post infectious cough. No red flags today. No signs of bacterial infection. Supportive care reviewed. rec cheratussin for cough at night time, short course of low dose ibuprofen for residual bronchial inflammation. Update if not improving with treatment as expected.

## 2017-02-07 NOTE — Progress Notes (Signed)
BP 130/78 (BP Location: Left Arm, Patient Position: Sitting, Cuff Size: Large)   Pulse 80   Temp 98 F (36.7 C) (Oral)   Wt 246 lb (111.6 kg)   SpO2 95%   BMI 36.33 kg/m    CC: check skin lesion, cough Subjective:    Patient ID: Lisa Osborne, female    DOB: [birth year], 77 y.o.   MRN: [id number]  HPI: Lisa Osborne is a 77 y.o. female presenting on 02/07/2017 for Skin lesion (Has area on back that has not healed over 1 yr) and Cough (Productive, not as much now. Started 01/18/17. Tried Robitussin and Mucinex helped with mucous.)   2+ wk h/o "feeling under the weather" started with worsening GERD with progression to productive cough, chills, congestion. Tried robitussin and mucinex with some improvement. Overall feels better, but notes persistent hacking cough. She does endorse wheezing and dyspnea. Cough wakes her up at night.  Denies ear or tooth pain, ST, PNdrainage. No sick contacts at home. Non smoker. No h/o asthma. She is taking prevacid daily.   Would like skin lesion evaluated. Present L neck. Has tried tea tree oil, cortisone, antiviral. Spends summer in pool with grandson. No pain, no itching.   Relevant past medical, surgical, family and social history reviewed and updated as indicated. Interim medical history since our last visit reviewed. Allergies and medications reviewed and updated. Outpatient Medications Prior to Visit  Medication Sig Dispense Refill  . APPLE CIDER VINEGAR PO Take 1 capsule by mouth daily.    . Calcium Citrate-Vitamin D (CALCIUM CITRATE + D PO) Take 1 tablet by mouth daily.     . Coenzyme Q10 (COQ10) 200 MG CAPS Take 1 capsule by mouth daily.     . Ferrous Sulfate (IRON) 325 (65 Fe) MG TABS Take by mouth daily.    . lansoprazole (PREVACID) 15 MG capsule TAKE ONE CAPSULE BY MOUTH EVERY DAY 90 capsule 3  . levothyroxine (SYNTHROID, LEVOTHROID) 25 MCG tablet TAKE ONE TABLET BY MOUTH DAILY 90 tablet 3  . loratadine (CLARITIN) 10 MG  tablet Take 10 mg by mouth daily as needed for allergies. Reported on 03/17/2015    . Melatonin 5 MG TABS Take 5 mg by mouth at bedtime.     . Omega-3 Fatty Acids (FISH OIL) 1000 MG CAPS Take 1 capsule by mouth daily.    . polycarbophil (FIBERCON) 625 MG tablet Take 625 mg by mouth daily. Not sure of dose    . sertraline (ZOLOFT) 100 MG tablet TAKE 1 TABLET (100 MG TOTAL) BY MOUTH DAILY. 90 tablet 3  . vitamin B-12 (CYANOCOBALAMIN) 1000 MCG tablet Take 1,000 mcg by mouth daily.     No facility-administered medications prior to visit.      Per HPI unless specifically indicated in ROS section below Review of Systems     Objective:    BP 130/78 (BP Location: Left Arm, Patient Position: Sitting, Cuff Size: Large)   Pulse 80   Temp 98 F (36.7 C) (Oral)   Wt 246 lb (111.6 kg)   SpO2 95%   BMI 36.33 kg/m   Wt Readings from Last 3 Encounters:  02/07/17 246 lb (111.6 kg)  12/31/16 248 lb (112.5 kg)  09/28/16 239 lb 9.6 oz (108.7 kg)    Physical Exam  Constitutional: She appears well-developed and well-nourished. No distress.  HENT:  Head: Normocephalic and atraumatic.  Right Ear: Hearing, tympanic membrane, external ear and ear canal normal.  Left Ear: Tympanic membrane, external  ear and ear canal normal.  Nose: Mucosal edema (mild) present. No rhinorrhea. Right sinus exhibits no maxillary sinus tenderness and no frontal sinus tenderness. Left sinus exhibits no maxillary sinus tenderness and no frontal sinus tenderness.  Mouth/Throat: Uvula is midline, oropharynx is clear and moist and mucous membranes are normal. No oropharyngeal exudate, posterior oropharyngeal edema, posterior oropharyngeal erythema or tonsillar abscesses.  Eyes: Conjunctivae and EOM are normal. Pupils are equal, round, and reactive to light. No scleral icterus.  Neck: Normal range of motion. Neck supple.  Cardiovascular: Normal rate, regular rhythm, normal heart sounds and intact distal pulses.  No murmur  heard. Pulmonary/Chest: Effort normal and breath sounds normal. No respiratory distress. She has no wheezes. She has no rales.  Lungs clear  Lymphadenopathy:    She has no cervical adenopathy.  Skin: Skin is warm and dry. No rash noted.  Small flesh colored growth L upper back near neck   Nursing note and vitals reviewed.  Results for orders placed or performed in visit on 02/03/17  HM MAMMOGRAPHY  Result Value Ref Range   HM Mammogram 0-4 Bi-Rad 0-4 Bi-Rad, Self Reported Normal   Lab Results  Component Value Date   CREATININE 1.06 06/17/2016       Assessment & Plan:   Problem List Items Addressed This Visit    Cough - Primary    Anticipate post infectious cough. No red flags today. No signs of bacterial infection. Supportive care reviewed. rec cheratussin for cough at night time, short course of low dose ibuprofen for residual bronchial inflammation. Update if not improving with treatment as expected.       Diabetes mellitus type 2, controlled ([place])   Nevus    Anticipate benign nevus - reassurance provided          Follow up plan: Return if symptoms worsen or fail to improve.  Ria Bush, MD

## 2017-02-07 NOTE — Patient Instructions (Addendum)
Skin spot looking ok.  I think you have post-infectious cough coming after initial respiratory infection. Treat with ibuprofen 400mg  twice daily with meals for 3 days and codeine cough syrup.  Push fluids and rest.  Let us know if fever >101, worsening productive cough.

## 2017-02-07 NOTE — Assessment & Plan Note (Signed)
Anticipate benign nevus - reassurance provided

## 2017-02-15 ENCOUNTER — Ambulatory Visit (AMBULATORY_SURGERY_CENTER): Payer: Medicare Other | Admitting: Gastroenterology

## 2017-02-15 ENCOUNTER — Encounter: Payer: Self-pay | Admitting: Gastroenterology

## 2017-02-15 ENCOUNTER — Other Ambulatory Visit: Payer: Self-pay

## 2017-02-15 VITALS — BP 136/76 | HR 61 | Temp 95.3°F | Resp 11 | Ht 69.0 in | Wt 248.0 lb

## 2017-02-15 DIAGNOSIS — Z86010 Personal history of colonic polyps: Secondary | ICD-10-CM

## 2017-02-15 DIAGNOSIS — Z85038 Personal history of other malignant neoplasm of large intestine: Secondary | ICD-10-CM

## 2017-02-15 MED ORDER — SODIUM CHLORIDE 0.9 % IV SOLN: 500.0000 mL | Freq: Once | INTRAVENOUS | Status: DC

## 2017-02-15 NOTE — Progress Notes (Signed)
Pt's states no medical or surgical changes since previsit or office visit. maw 

## 2017-02-15 NOTE — Op Note (Signed)
[facility] Patient Name: Lisa Osborne Procedure Date: 02/15/2017 9:03 AM MRN: [id number] Endoscopist: Remo Lipps P. Armbruster MD, MD Age: 77 Referring MD:  Date of Birth: [birth year] Gender: Female Account #: [id number] Procedure:                Colonoscopy Indications:              High risk colon cancer surveillance: Personal                            history of colon cancer in 2014, last colonoscopy                            March 2018 - 5 adenomas removed but exam limited by                            fair prep. patient here for surveillance given                            limited prep on last exam, double prep for today's                            exam Medicines:                Monitored Anesthesia Care Procedure:                Pre-Anesthesia Assessment:                           - Prior to the procedure, a History and Physical                            was performed, and patient medications and                            allergies were reviewed. The patient's tolerance of                            previous anesthesia was also reviewed. The risks                            and benefits of the procedure and the sedation                            options and risks were discussed with the patient.                            All questions were answered, and informed consent                            was obtained. Prior Anticoagulants: The patient has                            taken no previous anticoagulant or antiplatelet  agents. ASA Grade Assessment: II - A patient with                            mild systemic disease. After reviewing the risks                            and benefits, the patient was deemed in                            satisfactory condition to undergo the procedure.                           After obtaining informed consent, the colonoscope                            was passed under direct vision. Throughout the                             procedure, the patient's blood pressure, pulse, and                            oxygen saturations were monitored continuously. The                            Colonoscope was introduced through the anus and                            advanced to the the cecum, identified by                            appendiceal orifice and ileocecal valve. The                            colonoscopy was performed without difficulty. The                            patient tolerated the procedure well. The quality                            of the bowel preparation was good. The ileocecal                            valve, appendiceal orifice, and rectum were                            photographed. Scope In: 9:13:52 AM Scope Out: 9:27:14 AM Scope Withdrawal Time: 0 hours 9 minutes 35 seconds  Total Procedure Duration: 0 hours 13 minutes 22 seconds  Findings:                 The perianal and digital rectal examinations were                            normal.  A few medium-mouthed diverticula were found in the                            ascending colon.                           There was evidence of a prior end-to-end                            colo-colonic anastomosis in the sigmoid colon. This                            was patent and was characterized by healthy                            appearing mucosa.                           Internal hemorrhoids were found during retroflexion.                           The exam was otherwise without abnormality. No                            polyps. Complications:            No immediate complications. Estimated blood loss:                            None. Estimated Blood Loss:     Estimated blood loss: none. Impression:               - Diverticulosis in the ascending colon.                           - Patent end-to-end colo-colonic anastomosis,                            characterized by healthy appearing  mucosa.                           - Internal hemorrhoids.                           - The examination was otherwise normal.                           - No specimens collected. Recommendation:           - Patient has a contact number available for                            emergencies. The signs and symptoms of potential                            delayed complications were discussed with the  patient. Return to normal activities tomorrow.                            Written discharge instructions were provided to the                            patient.                           - Resume previous diet.                           - Continue present medications.                           - Repeat colonoscopy in 3 years for surveillance                            given 5 pre-cancerous polyps removed in 2018 [place]. Armbruster MD, MD 02/15/2017 9:32:00 AM This report has been signed electronically.

## 2017-02-15 NOTE — Progress Notes (Signed)
Spontaneous respirations throughout. VSS. Resting comfortably. To PACU on room air. Report to  RN. 

## 2017-02-15 NOTE — Patient Instructions (Signed)
YOU HAD AN ENDOSCOPIC PROCEDURE TODAY AT THE [facility]:   Refer to the procedure report that was given to you for any specific questions about what was found during the examination.  If the procedure report does not answer your questions, please call your gastroenterologist to clarify.  If you requested that your care partner not be given the details of your procedure findings, then the procedure report has been included in a sealed envelope for you to review at your convenience later.  YOU SHOULD EXPECT: Some feelings of bloating in the abdomen. Passage of more gas than usual.  Walking can help get rid of the air that was put into your GI tract during the procedure and reduce the bloating. If you had a lower endoscopy (such as a colonoscopy or flexible sigmoidoscopy) you may notice spotting of blood in your stool or on the toilet paper. If you underwent a bowel prep for your procedure, you may not have a normal bowel movement for a few days.  Please Note:  You might notice some irritation and congestion in your nose or some drainage.  This is from the oxygen used during your procedure.  There is no need for concern and it should clear up in a day or so.  SYMPTOMS TO REPORT IMMEDIATELY:   Following lower endoscopy (colonoscopy or flexible sigmoidoscopy):  Excessive amounts of blood in the stool  Significant tenderness or worsening of abdominal pains  Swelling of the abdomen that is new, acute  Fever of 100F or higher  For urgent or emergent issues, a gastroenterologist can be reached at any hour by calling [phone].   DIET:  We do recommend a small meal at first, but then you may proceed to your regular diet.  Drink plenty of fluids but you should avoid alcoholic beverages for 24 hours.  MEDICATIONS: Continue present medications.  Please see handouts given to you by your recovery nurse.  ACTIVITY:  You should plan to take it easy for the rest of today and you should NOT  DRIVE or use heavy machinery until tomorrow (because of the sedation medicines used during the test).    FOLLOW UP: Our staff will call the number listed on your records the next business day following your procedure to check on you and address any questions or concerns that you may have regarding the information given to you following your procedure. If we do not reach you, we will leave a message.  However, if you are feeling well and you are not experiencing any problems, there is no need to return our call.  We will assume that you have returned to your regular daily activities without incident.  If any biopsies were taken you will be contacted by phone or by letter within the next 1-3 weeks.  Please call us at [phone] if you have not heard about the biopsies in 3 weeks.   Thank you for allowing us to provide for your healthcare needs today.   SIGNATURES/CONFIDENTIALITY: You and/or your care partner have signed paperwork which will be entered into your electronic medical record.  These signatures attest to the fact that that the information above on your After Visit Summary has been reviewed and is understood.  Full responsibility of the confidentiality of this discharge information lies with you and/or your care-partner. 

## 2017-02-16 ENCOUNTER — Telehealth: Payer: Self-pay | Admitting: *Deleted

## 2017-02-16 NOTE — Telephone Encounter (Signed)
  Follow up Call-  Call back number 02/15/2017 04/12/2016  Post procedure Call Back phone  # [phone] hm [phone]  Permission to leave phone message Yes Yes  Some recent data might be hidden     Patient questions:  Do you have a fever, pain , or abdominal swelling? No. Pain Score  0 *  Have you tolerated food without any problems? Yes.    Have you been able to return to your normal activities? Yes.    Do you have any questions about your discharge instructions: Diet   No. Medications  No. Follow up visit  No.  Do you have questions or concerns about your Care? No.  Actions: * If pain score is 4 or above: No action needed, pain <4.

## 2017-02-20 ENCOUNTER — Encounter: Payer: Self-pay | Admitting: Family Medicine

## 2017-02-22 ENCOUNTER — Encounter: Payer: Self-pay | Admitting: Family Medicine

## 2017-06-23 ENCOUNTER — Ambulatory Visit: Payer: Medicare Other

## 2017-06-28 ENCOUNTER — Encounter: Payer: Medicare Other | Admitting: Family Medicine

## 2017-07-20 ENCOUNTER — Other Ambulatory Visit: Payer: Self-pay | Admitting: Family Medicine

## 2017-08-02 ENCOUNTER — Ambulatory Visit: Payer: Medicare Other

## 2017-08-05 ENCOUNTER — Encounter: Payer: Medicare Other | Admitting: Family Medicine

## 2017-08-13 ENCOUNTER — Other Ambulatory Visit: Payer: Self-pay | Admitting: Family Medicine

## 2017-08-18 ENCOUNTER — Other Ambulatory Visit: Payer: Self-pay | Admitting: Family Medicine

## 2017-08-18 DIAGNOSIS — C189 Malignant neoplasm of colon, unspecified: Secondary | ICD-10-CM

## 2017-08-18 DIAGNOSIS — E785 Hyperlipidemia, unspecified: Secondary | ICD-10-CM

## 2017-08-18 DIAGNOSIS — E039 Hypothyroidism, unspecified: Secondary | ICD-10-CM

## 2017-08-18 DIAGNOSIS — E1169 Type 2 diabetes mellitus with other specified complication: Secondary | ICD-10-CM

## 2017-08-19 ENCOUNTER — Ambulatory Visit (INDEPENDENT_AMBULATORY_CARE_PROVIDER_SITE_OTHER): Payer: Medicare Other

## 2017-08-19 VITALS — BP 130/84 | HR 62 | Temp 98.3°F | Ht 67.5 in | Wt 230.5 lb

## 2017-08-19 DIAGNOSIS — E039 Hypothyroidism, unspecified: Secondary | ICD-10-CM

## 2017-08-19 DIAGNOSIS — C189 Malignant neoplasm of colon, unspecified: Secondary | ICD-10-CM | POA: Diagnosis not present

## 2017-08-19 DIAGNOSIS — Z0000 Encounter for general adult medical examination without abnormal findings: Secondary | ICD-10-CM | POA: Diagnosis not present

## 2017-08-19 DIAGNOSIS — E785 Hyperlipidemia, unspecified: Secondary | ICD-10-CM

## 2017-08-19 DIAGNOSIS — E1169 Type 2 diabetes mellitus with other specified complication: Secondary | ICD-10-CM

## 2017-08-19 LAB — HEMOGLOBIN A1C: Hgb A1c MFr Bld: 6.3 % (ref 4.6–6.5)

## 2017-08-19 LAB — MICROALBUMIN / CREATININE URINE RATIO
Creatinine,U: 238.9 mg/dL
Microalb Creat Ratio: 1 mg/g (ref 0.0–30.0)
Microalb, Ur: 2.4 mg/dL — ABNORMAL HIGH (ref 0.0–1.9)

## 2017-08-19 LAB — CBC WITH DIFFERENTIAL/PLATELET
BASOS ABS: 0 10*3/uL (ref 0.0–0.1)
Basophils Relative: 0.5 % (ref 0.0–3.0)
EOS PCT: 0.3 % (ref 0.0–5.0)
Eosinophils Absolute: 0 10*3/uL (ref 0.0–0.7)
HEMATOCRIT: 39 % (ref 36.0–46.0)
Hemoglobin: 13.3 g/dL (ref 12.0–15.0)
LYMPHS PCT: 35.2 % (ref 12.0–46.0)
Lymphs Abs: 1.8 10*3/uL (ref 0.7–4.0)
MCHC: 34 g/dL (ref 30.0–36.0)
MCV: 95.1 fl (ref 78.0–100.0)
Monocytes Absolute: 0.6 10*3/uL (ref 0.1–1.0)
Monocytes Relative: 11.5 % (ref 3.0–12.0)
Neutro Abs: 2.6 10*3/uL (ref 1.4–7.7)
Neutrophils Relative %: 52.5 % (ref 43.0–77.0)
Platelets: 184 10*3/uL (ref 150.0–400.0)
RBC: 4.1 Mil/uL (ref 3.87–5.11)
RDW: 13.2 % (ref 11.5–15.5)
WBC: 5 10*3/uL (ref 4.0–10.5)

## 2017-08-19 LAB — COMPREHENSIVE METABOLIC PANEL
ALT: 8 U/L (ref 0–35)
AST: 17 U/L (ref 0–37)
Albumin: 4.4 g/dL (ref 3.5–5.2)
Alkaline Phosphatase: 59 U/L (ref 39–117)
BUN: 16 mg/dL (ref 6–23)
CO2: 27 mEq/L (ref 19–32)
Calcium: 9.3 mg/dL (ref 8.4–10.5)
Chloride: 102 mEq/L (ref 96–112)
Creatinine, Ser: 1.04 mg/dL (ref 0.40–1.20)
GFR: 54.82 mL/min — ABNORMAL LOW (ref >60.00)
Glucose, Bld: 122 mg/dL — ABNORMAL HIGH (ref 70–99)
Potassium: 4.2 mEq/L (ref 3.5–5.1)
Sodium: 139 mEq/L (ref 135–145)
Total Bilirubin: 0.5 mg/dL (ref 0.2–1.2)
Total Protein: 7.8 g/dL (ref 6.0–8.3)

## 2017-08-19 LAB — LIPID PANEL
CHOL/HDL RATIO: 4
Cholesterol: 219 mg/dL — ABNORMAL HIGH (ref 0–200)
HDL: 52.7 mg/dL (ref >39.00)
NONHDL: 165.98
Triglycerides: 224 mg/dL — ABNORMAL HIGH (ref 0.0–149.0)
VLDL: 44.8 mg/dL — ABNORMAL HIGH (ref 0.0–40.0)

## 2017-08-19 LAB — TSH: TSH: 1.84 u[IU]/mL (ref 0.35–4.50)

## 2017-08-19 LAB — LDL CHOLESTEROL, DIRECT: LDL DIRECT: 132 mg/dL

## 2017-08-19 NOTE — Progress Notes (Signed)
PCP notes:   Health maintenance:  Foot exam - PCP please address at next appt A1C - completed Microalbmumin - completed  Abnormal screenings:   Fall risk - hx of single fall  Fall Risk  08/19/2017 06/17/2016 07/25/2015 06/11/2014 09/07/2013  Falls in the past year? Yes Yes Yes Yes No  Comment fell downstairs and caused fractures to 2 toes pt tripped and fell down stairs causing injury to right foot; no medical treatment - - -  Number falls in past yr: 1 1 2  or more 1 -  Injury with Fall? Yes Yes Yes Yes -  Comment - - - accident knees gave out.  -  Follow up - - Falls evaluation completed;Falls prevention discussed;Education provided - -   Patient concerns:   None  Nurse concerns:  None  Next PCP appt:   08/26/17 @ 0830  I reviewed health advisor's note, was available for consultation on the day of service listed in this note, and agree with documentation and plan. Elsie Stain, MD.

## 2017-08-19 NOTE — Patient Instructions (Addendum)
Lisa Osborne , Thank you for taking time to come for your Medicare Wellness Visit. I appreciate your ongoing commitment to your health goals. Please review the following plan we discussed and let me know if I can assist you in the future.   These are the goals we discussed: Goals    . Increase physical activity     Starting 08/19/2017, I will continue to walk at least 30 minutes twice weekly and to swim for 2 hours weekly.        This is a list of the screening recommended for you and due dates:  Health Maintenance  Topic Date Due  . Complete foot exam   08/26/2017*  . DTaP/Tdap/Td vaccine (1 - Tdap) 08/23/2018*  . Flu Shot  08/25/2017  . Eye exam for diabetics  11/25/2017  . Mammogram  02/02/2018  . Hemoglobin A1C  02/19/2018  . Urine Protein Check  08/20/2018  . Tetanus Vaccine  08/23/2018  . Colon Cancer Screening  02/16/2020  . DEXA scan (bone density measurement)  Completed  . Pneumonia vaccines  Completed  *Topic was postponed. The date shown is not the original due date.   Preventive Care for Adults  A healthy lifestyle and preventive care can promote health and wellness. Preventive health guidelines for adults include the following key practices.  . A routine yearly physical is a good way to check with your health care provider about your health and preventive screening. It is a chance to share any concerns and updates on your health and to receive a thorough exam.  . Visit your dentist for a routine exam and preventive care every 6 months. Brush your teeth twice a day and floss once a day. Good oral hygiene prevents tooth decay and gum disease.  . The frequency of eye exams is based on your age, health, family medical history, use  of contact lenses, and other factors. Follow your health care provider's recommendations for frequency of eye exams.  . Eat a healthy diet. Foods like vegetables, fruits, whole grains, low-fat dairy products, and lean protein foods contain  the nutrients you need without too many calories. Decrease your intake of foods high in solid fats, added sugars, and salt. Eat the right amount of calories for you. Get information about a proper diet from your health care provider, if necessary.  . Regular physical exercise is one of the most important things you can do for your health. Most adults should get at least 150 minutes of moderate-intensity exercise (any activity that increases your heart rate and causes you to sweat) each week. In addition, most adults need muscle-strengthening exercises on 2 or more days a week.  Silver Sneakers may be a benefit available to you. To determine eligibility, you may visit the website: www.silversneakers.com or contact program at 901-020-9549 Mon-Fri between 8AM-8PM.   . Maintain a healthy weight. The body mass index (BMI) is a screening tool to identify possible weight problems. It provides an estimate of body fat based on height and weight. Your health care provider can find your BMI and can help you achieve or maintain a healthy weight.   For adults 20 years and older: ? A BMI below 18.5 is considered underweight. ? A BMI of 18.5 to 24.9 is normal. ? A BMI of 25 to 29.9 is considered overweight. ? A BMI of 30 and above is considered obese.   . Maintain normal blood lipids and cholesterol levels by exercising and minimizing your intake of saturated  fat. Eat a balanced diet with plenty of fruit and vegetables. Blood tests for lipids and cholesterol should begin at age 25 and be repeated every 5 years. If your lipid or cholesterol levels are high, you are over 50, or you are at high risk for heart disease, you may need your cholesterol levels checked more frequently. Ongoing high lipid and cholesterol levels should be treated with medicines if diet and exercise are not working.  . If you smoke, find out from your health care provider how to quit. If you do not use tobacco, please do not start.  . If  you choose to drink alcohol, please do not consume more than 2 drinks per day. One drink is considered to be 12 ounces (355 mL) of beer, 5 ounces (148 mL) of wine, or 1.5 ounces (44 mL) of liquor.  . If you are 30-24 years old, ask your health care provider if you should take aspirin to prevent strokes.  . Use sunscreen. Apply sunscreen liberally and repeatedly throughout the day. You should seek shade when your shadow is shorter than you. Protect yourself by wearing long sleeves, pants, a wide-brimmed hat, and sunglasses year round, whenever you are outdoors.  . Once a month, do a whole body skin exam, using a mirror to look at the skin on your back. Tell your health care provider of new moles, moles that have irregular borders, moles that are larger than a pencil eraser, or moles that have changed in shape or color.

## 2017-08-19 NOTE — Progress Notes (Signed)
Subjective:   Lisa Osborne is a 77 y.o. female who presents for Medicare Annual (Subsequent) preventive examination.  Review of Systems:  N/A Cardiac Risk Factors include: advanced age (>32mn, >>49women);diabetes mellitus;dyslipidemia;obesity (BMI >30kg/m2)     Objective:     Vitals: BP 130/84 (BP Location: Left Arm, Patient Position: Sitting, Cuff Size: Large)   Pulse 62   Temp 98.3 F (36.8 C) (Oral)   Ht 5' 7.5" (1.715 m) Comment: no shoes  Wt 230 lb 8 oz (104.6 kg)   SpO2 96%   BMI 35.57 kg/m   Body mass index is 35.57 kg/m.  Advanced Directives 08/19/2017 06/17/2016 03/12/2016 08/11/2015 07/25/2015 03/18/2015 02/10/2015  Does Patient Have a Medical Advance Directive? Yes Yes Yes Yes Yes Yes No  Type of [place]aramed[place]of [place] will [place] will [place] will - [place] will - -  Does patient want to make changes to medical advance directive? - - - - No - Patient declined No - Patient declined -  Copy of [place] Chart? Yes No - copy requested No - copy requested No - copy requested No - copy requested - -  Would patient like information on creating a medical advance directive? - - - - - - No - patient declined information    Tobacco Social History   Tobacco Use  Smoking Status Former Smoker  . Last attempt to quit: 04/06/1967  . Years since quitting: 50.4  Smokeless Tobacco Never Used     Counseling given: No   Clinical Intake:  Pre-visit preparation completed: Yes  Pain : No/denies pain Pain Score: 0-No pain     Nutritional Status: BMI > 30  Obese Nutritional Risks: None Diabetes: Yes CBG done?: No Did pt. bring in CBG monitor from home?: No  How often do you need to have someone help you when you read instructions, pamphlets, or other written materials from your doctor or pharmacy?: 1 - Never What is the last  grade level you completed in school?: Masters degree  Interpreter Needed?: No  Comments: pt is divorced and lives alone Information entered by :: LPinson, LPN  Past Medical History:  Diagnosis Date  . Abnormal EKG    incomplete RBBB  . Acute cholecystitis   . Allergy   . Anemia   . Anxiety   . Arthritis    left side lower back pain  . Colon adenocarcinoma - rectosigmoid T3N1 (1/15) s/p lap LAR 04/14/2012   Status post laparoscopic lower anterior resection  . Dehydration 02/2015  . Depression    severe in past  . Diabetes mellitus type 2, controlled ([place] 2006   diet controlled  . GERD (gastroesophageal reflux disease)    bad if off PPI  . History of chicken pox   . History of iron deficiency anemia   . HLD (hyperlipidemia) 08/03/2015  . Hypothyroidism    mild  . Rosacea   . Seasonal allergies   . Unilateral deafnesses    left ear deaf, wears hearing aid right side  . Urine incontinence    mild, wears pad   Past Surgical History:  Procedure Laterality Date  . ABDOMINOPLASTY  1996  . CHOLECYSTECTOMY N/A 03/20/2015   Procedure: LAPAROSCOPIC CHOLECYSTECTOMY;  Surgeon: RFlorene Glen MD;  Location: [facility] ORS;  Service: General;  Laterality: N/A;  . COLONOSCOPY  08/2006   normal  . COLONOSCOPY  03/2013   2 polyps,  mild diverticulosis, rpt 3 yrs [name])  . COLONOSCOPY  03/2016   5 TAs, fair prep, patent anastomosis, rpt 6-12 mo ([place]uster)  . COLONOSCOPY  01/2017   diverticulosis, healthy anastomosis, rpt 5 yrs (Armbruster)  . FACELIFT  05/2011  . INCONTINENCE SURGERY  1996  . LAPAROSCOPIC LOW ANTERIOR RESECTION N/A 04/14/2012   colon cancer at rectosigmoid junction s/p rigid proctoscopy splenic flexure mobilization lysis of adhesion;  Surgeon: Adin Hector, MD;   . MOUTH SURGERY     crown repair  . POLYPECTOMY    . TOTAL VAGINAL HYSTERECTOMY  1985   cancer cells on cervix   Family History  Problem Relation Age of Onset  . Breast cancer Mother 64       breast  cancer then multiple myeloma  . Cancer Mother        breast  . Alcohol abuse Father   . Heart disease Father        heart failure  . Mental illness Brother        2 brothers severe depression, suicide x2  . Diabetes Maternal Aunt   . Diabetes Maternal Uncle   . Diabetes Paternal Uncle   . Diabetes Paternal Grandfather   . Coronary artery disease Neg Hx   . Stroke Neg Hx   . Colon cancer Neg Hx   . Esophageal cancer Neg Hx   . Rectal cancer Neg Hx   . Stomach cancer Neg Hx   . Liver disease Neg Hx   . Pancreatic cancer Neg Hx   . Prostate cancer Neg Hx    Social History   Socioeconomic History  . Marital status: Single    Spouse name: Not on file  . Number of children: 1  . Years of education: Not on file  . Highest education level: Not on file  Occupational History  . Occupation: Retired Product manager: RETIRED  Social Needs  . Financial resource strain: Not on file  . Food insecurity:    Worry: Not on file    Inability: Not on file  . Transportation needs:    Medical: Not on file    Non-medical: Not on file  Tobacco Use  . Smoking status: Former Smoker    Last attempt to quit: 04/06/1967    Years since quitting: 50.4  . Smokeless tobacco: Never Used  Substance and Sexual Activity  . Alcohol use: Yes    Alcohol/week: 0.6 oz    Types: 1 Shots of liquor per week    Comment: 1 per week  . Drug use: No  . Sexual activity: Never  Lifestyle  . Physical activity:    Days per week: Not on file    Minutes per session: Not on file  . Stress: Not on file  Relationships  . Social connections:    Talks on phone: Not on file    Gets together: Not on file    Attends religious service: Not on file    Active member of club or organization: Not on file    Attends meetings of clubs or organizations: Not on file    Relationship status: Not on file  Other Topics Concern  . Not on file  Social History Narrative   Caffeine: 1 cup coffee/day   Lives alone.  1  daughter Lisa Osborne), 3 stillborns   Occupation: retired, was Chief Technology Officer.   Activity: no regular activity but does park far away, has treadmill at home   Diet: good fruits/vegetables, more  fruit/vegetable smoothies  good amt water    Outpatient Encounter Medications as of 08/19/2017  Medication Sig  . APPLE CIDER VINEGAR PO Take 1 capsule by mouth daily.  . Calcium Citrate-Vitamin D (CALCIUM CITRATE + D PO) Take 1 tablet by mouth daily.   . Coenzyme Q10 (COQ10) 200 MG CAPS Take 1 capsule by mouth daily.   . Ferrous Sulfate (IRON) 325 (65 Fe) MG TABS Take by mouth daily.  . lansoprazole (PREVACID) 15 MG capsule TAKE ONE CAPSULE BY MOUTH EVERY DAY  . levothyroxine (SYNTHROID, LEVOTHROID) 25 MCG tablet Take 1 tablet (25 mcg total) by mouth daily. NEED APPOINTMENT FOR ANY MORE REFILLS  . loratadine (CLARITIN) 10 MG tablet Take 10 mg by mouth daily as needed for allergies. Reported on 03/17/2015  . Melatonin 5 MG TABS Take 5 mg by mouth at bedtime.   . Omega-3 Fatty Acids (FISH OIL) 1000 MG CAPS Take 1 capsule by mouth daily.  . sertraline (ZOLOFT) 100 MG tablet TAKE 1 TABLET (100 MG TOTAL) BY MOUTH DAILY.  . vitamin B-12 (CYANOCOBALAMIN) 1000 MCG tablet Take 1,000 mcg by mouth daily.  . [DISCONTINUED] guaiFENesin-codeine (CHERATUSSIN AC) 100-10 MG/5ML syrup Take 5 mLs by mouth 2 (two) times daily as needed for cough or congestion (sedation precautions).  . [DISCONTINUED] polycarbophil (FIBERCON) 625 MG tablet Take 625 mg by mouth daily. Not sure of dose   Facility-Administered Encounter Medications as of 08/19/2017  Medication  . 0.9 %  sodium chloride infusion    Activities of Daily Living In your present state of health, do you have any difficulty performing the following activities: 08/19/2017  Hearing? Y  Vision? N  Difficulty concentrating or making decisions? N  Walking or climbing stairs? Y  Dressing or bathing? N  Doing errands, shopping? N  Preparing Food and eating ? N  Using the  Toilet? N  In the past six months, have you accidently leaked urine? N  Do you have problems with loss of bowel control? N  Managing your Medications? N  Managing your Finances? N  Housekeeping or managing your Housekeeping? N  Some recent data might be hidden    Patient Care Team: Ria Bush, MD as PCP - General (Family Medicine) Michael Boston, MD as Consulting Physician (General Surgery) Inda Castle, MD (Inactive) as Consulting Physician (Gastroenterology) Carola Frost, RN as Registered Nurse (Oncology) Ladell Pier, MD as Attending Physician (Medical Oncology) Thelma Comp, Baldwin as Referring Physician (Optometry) Owens Shark, NP as Nurse Practitioner (Hematology and Oncology) Ladell Pier, MD as Consulting Physician (Oncology)    Assessment:   This is a routine wellness examination for Merleen.  Hearing Screening Comments: Hearing aid (right ear); Deafness (left ear) Vision Screening Comments: Vision exam in 2019   Exercise Activities and Dietary recommendations Current Exercise Habits: Home exercise routine, Type of exercise: Other - see comments;walking(swimming 2hrs once weekly), Time (Minutes): 30, Frequency (Times/Week): 2, Weekly Exercise (Minutes/Week): 60, Intensity: Moderate, Exercise limited by: None identified  Goals    . Increase physical activity     Starting 08/19/2017, I will continue to walk at least 30 minutes twice weekly and to swim for 2 hours weekly.        Fall Risk Fall Risk  08/19/2017 06/17/2016 07/25/2015 06/11/2014 09/07/2013  Falls in the past year? Yes Yes Yes Yes No  Comment fell downstairs and caused fractures to 2 toes pt tripped and fell down stairs causing injury to right foot; no medical treatment - - -  Number falls in past yr: 1 1 2  or more 1 -  Injury with Fall? Yes Yes Yes Yes -  Comment - - - accident knees gave out.  -  Follow up - - Falls evaluation completed;Falls prevention discussed;Education provided - -     Depression Screen PHQ 2/9 Scores 08/19/2017 06/17/2016 07/25/2015 06/11/2014  PHQ - 2 Score 0 0 0 0  PHQ- 9 Score 0 - - -     Cognitive Function MMSE - Mini Mental State Exam 08/19/2017 06/17/2016 07/25/2015  Orientation to time 5 5 5   Orientation to Place 5 5 5   Registration 3 3 3   Attention/ Calculation 0 0 0  Recall 3 3 3   Language- name 2 objects 0 0 0  Language- repeat 1 1 1   Language- follow 3 step command 3 3 3   Language- read & follow direction 0 0 0  Write a sentence 0 0 0  Copy design 0 0 0  Total score 20 20 20        PLEASE NOTE: A Mini-Cog screen was completed. Maximum score is 20. A value of 0 denotes this part of Folstein MMSE was not completed or the patient failed this part of the Mini-Cog screening.   Mini-Cog Screening Orientation to Time - Max 5 pts Orientation to Place - Max 5 pts Registration - Max 3 pts Recall - Max 3 pts Language Repeat - Max 1 pts Language Follow 3 Step Command - Max 3 pts   Immunization History  Administered Date(s) Administered  . Influenza Split 11/26/2010  . Influenza, High Dose Seasonal PF 10/27/2015, 11/01/2016  . Influenza,inj,Quad PF,6+ Mos 11/09/2012  . Influenza-Unspecified 12/26/2013  . Pneumococcal Conjugate-13 05/22/2013  . Pneumococcal Polysaccharide-23 01/07/2007  . Td 08/22/2008   Screening Tests Health Maintenance  Topic Date Due  . FOOT EXAM  08/26/2017 (Originally 05/23/2014)  . DTaP/Tdap/Td (1 - Tdap) 08/23/2018 (Originally 08/23/2008)  . INFLUENZA VACCINE  08/25/2017  . OPHTHALMOLOGY EXAM  11/25/2017  . MAMMOGRAM  02/02/2018  . HEMOGLOBIN A1C  02/19/2018  . URINE MICROALBUMIN  08/20/2018  . TETANUS/TDAP  08/23/2018  . COLONOSCOPY  02/16/2020  . DEXA SCAN  Completed  . PNA vac Low Risk Adult  Completed       Plan:     I have personally reviewed, addressed, and noted the following in the patient's chart:  A. Medical and social history B. Use of alcohol, tobacco or illicit drugs  C. Current  medications and supplements D. Functional ability and status E.  Nutritional status F.  Physical activity G. Advance directives H. List of other physicians I.  Hospitalizations, surgeries, and ER visits in previous 12 months J.  [place] to include hearing, vision, cognitive, depression L. Referrals and appointments - none  In addition, I have reviewed and discussed with patient certain preventive protocols, quality metrics, and best practice recommendations. A written personalized care plan for preventive services as well as general preventive health recommendations were provided to patient.  See attached scanned questionnaire for additional information.   Signed,   Lindell Noe, MHA, BS, LPN Health Coach

## 2017-08-24 ENCOUNTER — Encounter: Payer: Medicare Other | Admitting: Family Medicine

## 2017-08-26 ENCOUNTER — Encounter: Payer: Self-pay | Admitting: Family Medicine

## 2017-08-26 ENCOUNTER — Ambulatory Visit (INDEPENDENT_AMBULATORY_CARE_PROVIDER_SITE_OTHER): Payer: Medicare Other | Admitting: Family Medicine

## 2017-08-26 VITALS — BP 120/84 | HR 71 | Temp 97.9°F | Ht 68.0 in | Wt 229.0 lb

## 2017-08-26 DIAGNOSIS — E669 Obesity, unspecified: Secondary | ICD-10-CM

## 2017-08-26 DIAGNOSIS — Z7189 Other specified counseling: Secondary | ICD-10-CM | POA: Diagnosis not present

## 2017-08-26 DIAGNOSIS — S61411A Laceration without foreign body of right hand, initial encounter: Secondary | ICD-10-CM

## 2017-08-26 DIAGNOSIS — Z0000 Encounter for general adult medical examination without abnormal findings: Secondary | ICD-10-CM | POA: Diagnosis not present

## 2017-08-26 DIAGNOSIS — S91114A Laceration without foreign body of right lesser toe(s) without damage to nail, initial encounter: Secondary | ICD-10-CM | POA: Insufficient documentation

## 2017-08-26 DIAGNOSIS — R7303 Prediabetes: Secondary | ICD-10-CM

## 2017-08-26 DIAGNOSIS — H9192 Unspecified hearing loss, left ear: Secondary | ICD-10-CM | POA: Insufficient documentation

## 2017-08-26 DIAGNOSIS — C189 Malignant neoplasm of colon, unspecified: Secondary | ICD-10-CM

## 2017-08-26 DIAGNOSIS — E785 Hyperlipidemia, unspecified: Secondary | ICD-10-CM

## 2017-08-26 DIAGNOSIS — E039 Hypothyroidism, unspecified: Secondary | ICD-10-CM

## 2017-08-26 DIAGNOSIS — K219 Gastro-esophageal reflux disease without esophagitis: Secondary | ICD-10-CM

## 2017-08-26 DIAGNOSIS — F331 Major depressive disorder, recurrent, moderate: Secondary | ICD-10-CM

## 2017-08-26 HISTORY — DX: Laceration without foreign body of right lesser toe(s) without damage to nail, initial encounter: S91.114A

## 2017-08-26 MED ORDER — SERTRALINE HCL 100 MG PO TABS: 100.0000 mg | ORAL_TABLET | Freq: Every day | ORAL | 3 refills | Status: DC

## 2017-08-26 MED ORDER — LANSOPRAZOLE 15 MG PO CPDR: 15.0000 mg | DELAYED_RELEASE_CAPSULE | Freq: Every day | ORAL | 3 refills | Status: DC

## 2017-08-26 MED ORDER — LEVOTHYROXINE SODIUM 25 MCG PO TABS: 25.0000 ug | ORAL_TABLET | Freq: Every day | ORAL | 3 refills | Status: DC

## 2017-08-26 NOTE — Patient Instructions (Addendum)
If interested, check with pharmacy about new 2 shot shingles series (shingrix).  You are doing well today. Return as needed or in 1 year for next physical. For hand and toe laceration - wounds cleaned today and then dermabond glue used to close wounds. Keep areas clean and dry, glue should slowly fall off by itself. Return if any redness or draining pus.  Health Maintenance, Female Adopting a healthy lifestyle and getting preventive care can go a long way to promote health and wellness. Talk with your health care provider about what schedule of regular examinations is right for you. This is a good chance for you to check in with your provider about disease prevention and staying healthy. In between checkups, there are plenty of things you can do on your own. Experts have done a lot of research about which lifestyle changes and preventive measures are most likely to keep you healthy. Ask your health care provider for more information. Weight and diet Eat a healthy diet  Be sure to include plenty of vegetables, fruits, low-fat dairy products, and lean protein.  Do not eat a lot of foods high in solid fats, added sugars, or salt.  Get regular exercise. This is one of the most important things you can do for your health. ? Most adults should exercise for at least 150 minutes each week. The exercise should increase your heart rate and make you sweat (moderate-intensity exercise). ? Most adults should also do strengthening exercises at least twice a week. This is in addition to the moderate-intensity exercise.  Maintain a healthy weight  Body mass index (BMI) is a measurement that can be used to identify possible weight problems. It estimates body fat based on height and weight. Your health care provider can help determine your BMI and help you achieve or maintain a healthy weight.  For females 49 years of age and older: ? A BMI below 18.5 is considered underweight. ? A BMI of 18.5 to 24.9 is  normal. ? A BMI of 25 to 29.9 is considered overweight. ? A BMI of 30 and above is considered obese.  Watch levels of cholesterol and blood lipids  You should start having your blood tested for lipids and cholesterol at 76 years of age, then have this test every 5 years.  You may need to have your cholesterol levels checked more often if: ? Your lipid or cholesterol levels are high. ? You are older than 76 years of age. ? You are at high risk for heart disease.  Cancer screening Lung Cancer  Lung cancer screening is recommended for adults 39-75 years old who are at high risk for lung cancer because of a history of smoking.  A yearly low-dose CT scan of the lungs is recommended for people who: ? Currently smoke. ? Have quit within the past 15 years. ? Have at least a 30-pack-year history of smoking. A pack year is smoking an average of one pack of cigarettes a day for 1 year.  Yearly screening should continue until it has been 15 years since you quit.  Yearly screening should stop if you develop a health problem that would prevent you from having lung cancer treatment.  Breast Cancer  Practice breast self-awareness. This means understanding how your breasts normally appear and feel.  It also means doing regular breast self-exams. Let your health care provider know about any changes, no matter how small.  If you are in your 20s or 30s, you should have a clinical  breast exam (CBE) by a health care provider every 1-3 years as part of a regular health exam.  If you are 76 or older, have a CBE every year. Also consider having a breast X-ray (mammogram) every year.  If you have a family history of breast cancer, talk to your health care provider about genetic screening.  If you are at high risk for breast cancer, talk to your health care provider about having an MRI and a mammogram every year.  Breast cancer gene (BRCA) assessment is recommended for women who have family members  with BRCA-related cancers. BRCA-related cancers include: ? Breast. ? Ovarian. ? Tubal. ? Peritoneal cancers.  Results of the assessment will determine the need for genetic counseling and BRCA1 and BRCA2 testing.  Cervical Cancer Your health care provider may recommend that you be screened regularly for cancer of the pelvic organs (ovaries, uterus, and vagina). This screening involves a pelvic examination, including checking for microscopic changes to the surface of your cervix (Pap test). You may be encouraged to have this screening done every 3 years, beginning at age 43.  For women ages 71-65, health care providers may recommend pelvic exams and Pap testing every 3 years, or they may recommend the Pap and pelvic exam, combined with testing for human papilloma virus (HPV), every 5 years. Some types of HPV increase your risk of cervical cancer. Testing for HPV may also be done on women of any age with unclear Pap test results.  Other health care providers may not recommend any screening for nonpregnant women who are considered low risk for pelvic cancer and who do not have symptoms. Ask your health care provider if a screening pelvic exam is right for you.  If you have had past treatment for cervical cancer or a condition that could lead to cancer, you need Pap tests and screening for cancer for at least 20 years after your treatment. If Pap tests have been discontinued, your risk factors (such as having a new sexual partner) need to be reassessed to determine if screening should resume. Some women have medical problems that increase the chance of getting cervical cancer. In these cases, your health care provider may recommend more frequent screening and Pap tests.  Colorectal Cancer  This type of cancer can be detected and often prevented.  Routine colorectal cancer screening usually begins at 76 years of age and continues through 76 years of age.  Your health care provider may recommend  screening at an earlier age if you have risk factors for colon cancer.  Your health care provider may also recommend using home test kits to check for hidden blood in the stool.  A small camera at the end of a tube can be used to examine your colon directly (sigmoidoscopy or colonoscopy). This is done to check for the earliest forms of colorectal cancer.  Routine screening usually begins at age 86.  Direct examination of the colon should be repeated every 5-10 years through 76 years of age. However, you may need to be screened more often if early forms of precancerous polyps or small growths are found.  Skin Cancer  Check your skin from head to toe regularly.  Tell your health care provider about any new moles or changes in moles, especially if there is a change in a mole's shape or color.  Also tell your health care provider if you have a mole that is larger than the size of a pencil eraser.  Always use sunscreen. Apply  sunscreen liberally and repeatedly throughout the day.  Protect yourself by wearing long sleeves, pants, a wide-brimmed hat, and sunglasses whenever you are outside.  Heart disease, diabetes, and high blood pressure  High blood pressure causes heart disease and increases the risk of stroke. High blood pressure is more likely to develop in: ? People who have blood pressure in the high end of the normal range (130-139/85-89 mm Hg). ? People who are overweight or obese. ? People who are African American.  If you are 4-62 years of age, have your blood pressure checked every 3-5 years. If you are 67 years of age or older, have your blood pressure checked every year. You should have your blood pressure measured twice-once when you are at a hospital or clinic, and once when you are not at a hospital or clinic. Record the average of the two measurements. To check your blood pressure when you are not at a hospital or clinic, you can use: ? An automated blood pressure machine at  a pharmacy. ? A home blood pressure monitor.  If you are between 41 years and 75 years old, ask your health care provider if you should take aspirin to prevent strokes.  Have regular diabetes screenings. This involves taking a blood sample to check your fasting blood sugar level. ? If you are at a normal weight and have a low risk for diabetes, have this test once every three years after 76 years of age. ? If you are overweight and have a high risk for diabetes, consider being tested at a younger age or more often. Preventing infection Hepatitis B  If you have a higher risk for hepatitis B, you should be screened for this virus. You are considered at high risk for hepatitis B if: ? You were born in a country where hepatitis B is common. Ask your health care provider which countries are considered high risk. ? Your parents were born in a high-risk country, and you have not been immunized against hepatitis B (hepatitis B vaccine). ? You have HIV or AIDS. ? You use needles to inject street drugs. ? You live with someone who has hepatitis B. ? You have had sex with someone who has hepatitis B. ? You get hemodialysis treatment. ? You take certain medicines for conditions, including cancer, organ transplantation, and autoimmune conditions.  Hepatitis C  Blood testing is recommended for: ? Everyone born from [birth year] through 1964. ? Anyone with known risk factors for hepatitis C.  Sexually transmitted infections (STIs)  You should be screened for sexually transmitted infections (STIs) including gonorrhea and chlamydia if: ? You are sexually active and are younger than 76 years of age. ? You are older than 76 years of age and your health care provider tells you that you are at risk for this type of infection. ? Your sexual activity has changed since you were last screened and you are at an increased risk for chlamydia or gonorrhea. Ask your health care provider if you are at risk.  If you do  not have HIV, but are at risk, it may be recommended that you take a prescription medicine daily to prevent HIV infection. This is called pre-exposure prophylaxis (PrEP). You are considered at risk if: ? You are sexually active and do not regularly use condoms or know the HIV status of your partner(s). ? You take drugs by injection. ? You are sexually active with a partner who has HIV.  Talk with your health care provider  about whether you are at high risk of being infected with HIV. If you choose to begin PrEP, you should first be tested for HIV. You should then be tested every 3 months for as long as you are taking PrEP. Pregnancy  If you are premenopausal and you may become pregnant, ask your health care provider about preconception counseling.  If you may become pregnant, take 400 to 800 micrograms (mcg) of folic acid every day.  If you want to prevent pregnancy, talk to your health care provider about birth control (contraception). Osteoporosis and menopause  Osteoporosis is a disease in which the bones lose minerals and strength with aging. This can result in serious bone fractures. Your risk for osteoporosis can be identified using a bone density scan.  If you are 64 years of age or older, or if you are at risk for osteoporosis and fractures, ask your health care provider if you should be screened.  Ask your health care provider whether you should take a calcium or vitamin D supplement to lower your risk for osteoporosis.  Menopause may have certain physical symptoms and risks.  Hormone replacement therapy may reduce some of these symptoms and risks. Talk to your health care provider about whether hormone replacement therapy is right for you. Follow these instructions at home:  Schedule regular health, dental, and eye exams.  Stay current with your immunizations.  Do not use any tobacco products including cigarettes, chewing tobacco, or electronic cigarettes.  If you are  pregnant, do not drink alcohol.  If you are breastfeeding, limit how much and how often you drink alcohol.  Limit alcohol intake to no more than 1 drink per day for nonpregnant women. One drink equals 12 ounces of beer, 5 ounces of wine, or 1 ounces of hard liquor.  Do not use street drugs.  Do not share needles.  Ask your health care provider for help if you need support or information about quitting drugs.  Tell your health care provider if you often feel depressed.  Tell your health care provider if you have ever been abused or do not feel safe at home. This information is not intended to replace advice given to you by your health care provider. Make sure you discuss any questions you have with your health care provider. Document Released: 07/27/2010 Document Revised: 06/19/2015 Document Reviewed: 10/15/2014 Elsevier Interactive Patient Education  [employer].

## 2017-08-26 NOTE — Assessment & Plan Note (Signed)
Advanced directive: Received, scanned 01/2017. HCPOA are daughter [name] then or SIL [name]. Does not want prolonged life support if terminal condition but does want to receive artifical nutrition and hydration. If advanced dementia, would grant discretion to [facility]. [place] for organ donor. Wants to be cremated.

## 2017-08-26 NOTE — Assessment & Plan Note (Signed)
longstanding

## 2017-08-26 NOTE — Assessment & Plan Note (Signed)
Preventative protocols reviewed and updated unless pt declined. Discussed healthy diet and lifestyle.  

## 2017-08-26 NOTE — Progress Notes (Signed)
BP 120/84 (BP Location: Left Arm, Patient Position: Sitting, Cuff Size: Large)   Pulse 71   Temp 97.9 F (36.6 C) (Oral)   Ht 5\' 8"  (1.727 m)   Wt 229 lb (103.9 kg)   SpO2 94%   BMI 34.82 kg/m    CC: CPE Subjective:    Patient ID: Lisa Osborne, female    DOB: 1940/08/31, 77 y.o.   MRN: [id number]  HPI: Lisa Osborne is a 77 y.o. female presenting on 08/26/2017 for Annual Exam (Pt 2.)   Saw Katha Cabal last week for medicare wellness visit. Note reviewed.   Feels she's doing well on current regimen of zoloft 100mg  daily. She just moved - loves new one story house.  No recent falls. Notices sudden turn to the right flares vertigo. Some headache behind R ear. Mild tinnitus.   She put up shelf with antiques last night, then it fell down on her and she cut hands and foot.   Preventative: Colon cancer s/p lap LAR 03/2012 - clinical remission followed by Dr Benay Spice ONC. COLONOSCOPY Date: 03/2013 2 polyps, mild diverticulosis, rpt 3 yrs [name]) COLONOSCOPY 03/2016 5 TAs, fair prep, patent anastomosis, rpt 6-12 mo ([facility]) COLONOSCOPY 01/2017 diverticulosis, healthy anastomosis, rpt 5 yrs ([facility]) Well woman - s/p total hysterectomy 1985, ovaries removed as well.  Mammogram 01/2017 Birads1 DEXA - done ~2005 in [place] and normal per patient.  Flu yearly Tetanus - 2010 Pneumovax 2008, prevnar 2015 Shingrix - discussed.  Advanced directive: Received, scanned. HCPOA are daughter [name] then or SIL [name]. Does not want prolonged life support if terminal condition but does want to receive artifical nutrition and hydration. If advanced dementia, would grant discretion to [facility]. [place] for organ donor. Wants to be cremated.  Seat belt use discussed Sunscreen use discussed. No changing moles on skin.  Ex smoker quit 1969 Alcohol - seldom Dentist - Q6 mo Eye exam - yearly  Caffeine: 1 cup coffee/day Lives alone. 1 daughter [name]), 3 stillborns Occupation:  retired, was Chief Technology Officer. Activity: walking and swimming  Diet: good water, fruits/vegetables  Relevant past medical, surgical, family and social history reviewed and updated as indicated. Interim medical history since our last visit reviewed. Allergies and medications reviewed and updated. Outpatient Medications Prior to Visit  Medication Sig Dispense Refill  . APPLE CIDER VINEGAR PO Take 1 capsule by mouth daily.    . Calcium Citrate-Vitamin D (CALCIUM CITRATE + D PO) Take 1 tablet by mouth daily.     . Coenzyme Q10 (COQ10) 200 MG CAPS Take 1 capsule by mouth daily.     . Ferrous Sulfate (IRON) 325 (65 Fe) MG TABS Take by mouth daily.    Marland Kitchen loratadine (CLARITIN) 10 MG tablet Take 10 mg by mouth daily as needed for allergies. Reported on 03/17/2015    . Melatonin 5 MG TABS Take 5 mg by mouth at bedtime.     . Omega-3 Fatty Acids (FISH OIL) 1000 MG CAPS Take 1 capsule by mouth daily.    . vitamin B-12 (CYANOCOBALAMIN) 1000 MCG tablet Take 1,000 mcg by mouth daily.    . lansoprazole (PREVACID) 15 MG capsule TAKE ONE CAPSULE BY MOUTH EVERY DAY 90 capsule 0  . levothyroxine (SYNTHROID, LEVOTHROID) 25 MCG tablet Take 1 tablet (25 mcg total) by mouth daily. NEED APPOINTMENT FOR ANY MORE REFILLS 30 tablet 0  . sertraline (ZOLOFT) 100 MG tablet TAKE 1 TABLET (100 MG TOTAL) BY MOUTH DAILY. 90 tablet 0  . 0.9 %  sodium chloride infusion      No facility-administered medications prior to visit.      Per HPI unless specifically indicated in ROS section below Review of Systems  Constitutional: Negative for activity change, appetite change, chills, fatigue, fever and unexpected weight change.  HENT: Negative for hearing loss.   Eyes: Negative for visual disturbance.  Respiratory: Positive for shortness of breath (exertional). Negative for cough, chest tightness and wheezing.   Cardiovascular: Negative for chest pain, palpitations and leg swelling.  Gastrointestinal: Negative for abdominal distention,  abdominal pain, blood in stool, constipation, diarrhea, nausea and vomiting.  Genitourinary: Negative for difficulty urinating and hematuria.  Musculoskeletal: Negative for arthralgias, myalgias and neck pain.  Skin: Negative for rash.  Neurological: Positive for dizziness (vertigo with sudden turns to the right) and headaches (at R ear). Negative for seizures and syncope.  Hematological: Negative for adenopathy. Bruises/bleeds easily.  Psychiatric/Behavioral: Negative for dysphoric mood. The patient is not nervous/anxious.        Objective:    BP 120/84 (BP Location: Left Arm, Patient Position: Sitting, Cuff Size: Large)   Pulse 71   Temp 97.9 F (36.6 C) (Oral)   Ht 5\' 8"  (1.727 m)   Wt 229 lb (103.9 kg)   SpO2 94%   BMI 34.82 kg/m   Wt Readings from Last 3 Encounters:  08/26/17 229 lb (103.9 kg)  08/19/17 230 lb 8 oz (104.6 kg)  02/15/17 248 lb (112.5 kg)    Ht Readings from Last 3 Encounters:  08/26/17 5\' 8"  (1.727 m)  08/19/17 5' 7.5" (1.715 m)  02/15/17 5\' 9"  (1.753 m)    Physical Exam  Constitutional: She is oriented to person, place, and time. She appears well-developed and well-nourished. No distress.  HENT:  Head: Normocephalic and atraumatic.  Right Ear: Hearing, tympanic membrane, external ear and ear canal normal.  Left Ear: Hearing, tympanic membrane, external ear and ear canal normal.  Nose: Nose normal.  Mouth/Throat: Uvula is midline, oropharynx is clear and moist and mucous membranes are normal. No oropharyngeal exudate, posterior oropharyngeal edema or posterior oropharyngeal erythema.  Eyes: Pupils are equal, round, and reactive to light. Conjunctivae and EOM are normal. No scleral icterus.  Neck: Normal range of motion. Neck supple. Carotid bruit is not present. No thyromegaly present.  Cardiovascular: Normal rate, regular rhythm, normal heart sounds and intact distal pulses.  No murmur heard. Pulses:      Radial pulses are 2+ on the right side, and 2+  on the left side.  Pulmonary/Chest: Effort normal and breath sounds normal. No respiratory distress. She has no wheezes. She has no rales.  Abdominal: Soft. Bowel sounds are normal. She exhibits no distension and no mass. There is no tenderness. There is no rebound and no guarding.  Musculoskeletal: Normal range of motion. She exhibits no edema.  Lymphadenopathy:    She has no cervical adenopathy.  Neurological: She is alert and oriented to person, place, and time.  CN grossly intact, station and gait intact  Skin: Skin is warm and dry. No rash noted.  ~1cm deep laceration to dorsal 5th R toe ~1cm superficial laceration to R lateral hand between thumb and 2nd digit No surrounding erythema  Psychiatric: She has a normal mood and affect. Her behavior is normal. Judgment and thought content normal.  Nursing note and vitals reviewed.  Results for orders placed or performed in visit on 08/19/17  CBC with Differential/Platelet  Result Value Ref Range   WBC 5.0 4.0 - 10.5  K/uL   RBC 4.10 3.87 - 5.11 Mil/uL   Hemoglobin 13.3 12.0 - 15.0 g/dL   HCT 39.0 36.0 - 46.0 %   MCV 95.1 78.0 - 100.0 fl   MCHC 34.0 30.0 - 36.0 g/dL   RDW 13.2 11.5 - 15.5 %   Platelets 184.0 150.0 - 400.0 K/uL   Neutrophils Relative % 52.5 43.0 - 77.0 %   Lymphocytes Relative 35.2 12.0 - 46.0 %   Monocytes Relative 11.5 3.0 - 12.0 %   Eosinophils Relative 0.3 0.0 - 5.0 %   Basophils Relative 0.5 0.0 - 3.0 %   Neutro Abs 2.6 1.4 - 7.7 K/uL   Lymphs Abs 1.8 0.7 - 4.0 K/uL   Monocytes Absolute 0.6 0.1 - 1.0 K/uL   Eosinophils Absolute 0.0 0.0 - 0.7 K/uL   Basophils Absolute 0.0 0.0 - 0.1 K/uL  Microalbumin / creatinine urine ratio  Result Value Ref Range   Microalb, Ur 2.4 (H) 0.0 - 1.9 mg/dL   Creatinine,U 238.9 mg/dL   Microalb Creat Ratio 1.0 0.0 - 30.0 mg/g  TSH  Result Value Ref Range   TSH 1.84 0.35 - 4.50 uIU/mL  Hemoglobin A1c  Result Value Ref Range   Hgb A1c MFr Bld 6.3 4.6 - 6.5 %  Comprehensive  metabolic panel  Result Value Ref Range   Sodium 139 135 - 145 mEq/L   Potassium 4.2 3.5 - 5.1 mEq/L   Chloride 102 96 - 112 mEq/L   CO2 27 19 - 32 mEq/L   Glucose, Bld 122 (H) 70 - 99 mg/dL   BUN 16 6 - 23 mg/dL   Creatinine, Ser 1.04 0.40 - 1.20 mg/dL   Total Bilirubin 0.5 0.2 - 1.2 mg/dL   Alkaline Phosphatase 59 39 - 117 U/L   AST 17 0 - 37 U/L   ALT 8 0 - 35 U/L   Total Protein 7.8 6.0 - 8.3 g/dL   Albumin 4.4 3.5 - 5.2 g/dL   Calcium 9.3 8.4 - 10.5 mg/dL   GFR 54.82 (L) >60.00 mL/min  Lipid panel  Result Value Ref Range   Cholesterol 219 (H) 0 - 200 mg/dL   Triglycerides 224.0 (H) 0.0 - 149.0 mg/dL   HDL 52.70 >39.00 mg/dL   VLDL 44.8 (H) 0.0 - 40.0 mg/dL   Total CHOL/HDL Ratio 4    NonHDL 165.98   LDL cholesterol, direct  Result Value Ref Range   Direct LDL 132.0 mg/dL      Assessment & Plan:   Problem List Items Addressed This Visit    Prediabetes    Congratulated on good control, now in prediabetes! Will change diagnosis.       Obesity, Class I, BMI 30.0-34.9 (see actual BMI)    Congratulated on ongoing weight loss noted. Reviewed healthy diet and lifestyle changes to affect sustainable weight loss.       MDD (major depressive disorder), recurrent episode, moderate (HCC)    Stable period on sertraline 100mg  daily - desires to continue at current dose.  She really enjoys her new one story home H/o SAD.       Relevant Medications   sertraline (ZOLOFT) 100 MG tablet   Laceration of right hand without complication, excluding fingers    Area cleaned with betadine. Dermabond tissue adhesive used over superficial laceration.      Laceration of fifth toe, right    Area cleaned with betadine and normal saline flush. Discussed options - dermabond vs suture. give small size of laceration,  I think tissue adhesive will suffice. No signs of surrounding infection or foreign object. Dermabond used to close laceration. Pt tolerated well. After care instructions reviewed,  as well as red flags for infection to return for immediate evaluation.       Hypothyroidism    Chronic, stable. Continue current regimen.       Relevant Medications   levothyroxine (SYNTHROID, LEVOTHROID) 25 MCG tablet   HLD (hyperlipidemia)    Chronic, levels above goal but adequate for age, only on fish oil.  The 10-year ASCVD risk score Mikey Bussing DC Brooke Bonito., et al., 2013) is: 26%   Values used to calculate the score:     Age: 89 years     Sex: Female     Is Non-Hispanic African American: No     Diabetic: Yes     Tobacco smoker: No     Systolic Blood Pressure: 962 mmHg     Is BP treated: No     HDL Cholesterol: 52.7 mg/dL     Total Cholesterol: 219 mg/dL       Health maintenance examination - Primary    Preventative protocols reviewed and updated unless pt declined. Discussed healthy diet and lifestyle.       GERD (gastroesophageal reflux disease)    Continue prevacid 15mg  daily.      Relevant Medications   lansoprazole (PREVACID) 15 MG capsule   Colon adenocarcinoma - rectosigmoid T3N1 (1/15) s/p lap LAR 04/14/2012    Approaching 5 years s/p treatment, doing wonderfully.       Advanced care planning/counseling discussion    Advanced directive: Received, scanned 01/2017. HCPOA are daughter [name] then or SIL [name]. Does not want prolonged life support if terminal condition but does want to receive artifical nutrition and hydration. If advanced dementia, would grant discretion to [facility]. [place] for organ donor. Wants to be cremated.       Acquired deafness of left ear    longstanding          Meds ordered this encounter  Medications  . sertraline (ZOLOFT) 100 MG tablet    Sig: Take 1 tablet (100 mg total) by mouth daily.    Dispense:  90 tablet    Refill:  3  . levothyroxine (SYNTHROID, LEVOTHROID) 25 MCG tablet    Sig: Take 1 tablet (25 mcg total) by mouth daily.    Dispense:  90 tablet    Refill:  3  . lansoprazole (PREVACID) 15 MG capsule    Sig: Take 1  capsule (15 mg total) by mouth daily.    Dispense:  90 capsule    Refill:  3   No orders of the defined types were placed in this encounter.   Follow up plan: Return in about 1 year (around 08/27/2018).  Ria Bush, MD

## 2017-08-27 ENCOUNTER — Telehealth: Payer: Self-pay | Admitting: Family Medicine

## 2017-08-27 DIAGNOSIS — S61411A Laceration without foreign body of right hand, initial encounter: Secondary | ICD-10-CM | POA: Insufficient documentation

## 2017-08-27 NOTE — Assessment & Plan Note (Signed)
Stable period on sertraline 100mg  daily - desires to continue at current dose.  She really enjoys her new one story home H/o SAD.

## 2017-08-27 NOTE — Assessment & Plan Note (Signed)
Chronic, stable. Continue current regimen. 

## 2017-08-27 NOTE — Assessment & Plan Note (Signed)
Congratulated on good control, now in prediabetes! Will change diagnosis.

## 2017-08-27 NOTE — Assessment & Plan Note (Signed)
Approaching 5 years s/p treatment, doing wonderfully.

## 2017-08-27 NOTE — Assessment & Plan Note (Signed)
Continue prevacid 15mg daily.  

## 2017-08-27 NOTE — Assessment & Plan Note (Signed)
Area cleaned with betadine. Dermabond tissue adhesive used over superficial laceration.

## 2017-08-27 NOTE — Assessment & Plan Note (Addendum)
Congratulated on ongoing weight loss noted. Reviewed healthy diet and lifestyle changes to affect sustainable weight loss.

## 2017-08-27 NOTE — Assessment & Plan Note (Signed)
Area cleaned with betadine and normal saline flush. Discussed options - dermabond vs suture. give small size of laceration, I think tissue adhesive will suffice. No signs of surrounding infection or foreign object. Dermabond used to close laceration. Pt tolerated well. After care instructions reviewed, as well as red flags for infection to return for immediate evaluation.

## 2017-08-27 NOTE — Assessment & Plan Note (Addendum)
Chronic, levels above goal but adequate for age, only on fish oil.  The 10-year ASCVD risk score Mikey Bussing DC Brooke Bonito., et al., 2013) is: 26%   Values used to calculate the score:     Age: 77 years     Sex: Female     Is Non-Hispanic African American: No     Diabetic: Yes     Tobacco smoker: No     Systolic Blood Pressure: 403 mmHg     Is BP treated: No     HDL Cholesterol: 52.7 mg/dL     Total Cholesterol: 219 mg/dL

## 2017-08-27 NOTE — Telephone Encounter (Signed)
plz calI - I forgot to check her right ear. I'd like for her to come in one day around lunch time next week (not Friday) and I will take a quick look.  Thanks

## 2017-08-29 NOTE — Telephone Encounter (Signed)
Spoke with pt relaying Dr. Synthia Innocent message.  Pt states she cannot come in this week but can come in next Thurs, 09/08/17 during lunch.

## 2017-08-29 NOTE — Telephone Encounter (Signed)
Ok thanks. Can we place her at 12:30 appt slot and just bring her back to an exam room when she arrives? Thank you.

## 2017-08-30 NOTE — Telephone Encounter (Signed)
Noted  

## 2017-08-30 NOTE — Telephone Encounter (Signed)
I spoke with pt. She said she has an 11:30 am dentist appt on 8/15 but felt pretty sure she would be here for 12:30 appt.

## 2017-09-08 ENCOUNTER — Ambulatory Visit (INDEPENDENT_AMBULATORY_CARE_PROVIDER_SITE_OTHER): Payer: Medicare Other | Admitting: Family Medicine

## 2017-09-08 ENCOUNTER — Encounter: Payer: Self-pay | Admitting: Family Medicine

## 2017-09-08 VITALS — BP 124/70 | HR 74 | Temp 98.2°F | Ht 68.0 in | Wt 229.2 lb

## 2017-09-08 DIAGNOSIS — G8929 Other chronic pain: Secondary | ICD-10-CM | POA: Insufficient documentation

## 2017-09-08 DIAGNOSIS — H8112 Benign paroxysmal vertigo, left ear: Secondary | ICD-10-CM

## 2017-09-08 DIAGNOSIS — R42 Dizziness and giddiness: Secondary | ICD-10-CM | POA: Insufficient documentation

## 2017-09-08 DIAGNOSIS — H9201 Otalgia, right ear: Secondary | ICD-10-CM

## 2017-09-08 DIAGNOSIS — H8110 Benign paroxysmal vertigo, unspecified ear: Secondary | ICD-10-CM | POA: Insufficient documentation

## 2017-09-08 NOTE — Assessment & Plan Note (Signed)
Overall benign exam. ?related to poorly fitting plastic glasses she uses to read at night. rec cushion for these.

## 2017-09-08 NOTE — Assessment & Plan Note (Signed)
Anticipate left sided. Epley performed x1 and pt tolerated well. Will provide with modified epleys to perform at home. If no better with this, she will let me know for ENT referral. Pt agrees with plan.

## 2017-09-08 NOTE — Progress Notes (Signed)
BP 124/70 (BP Location: Left Arm, Patient Position: Sitting, Cuff Size: Large)   Pulse 74   Temp 98.2 F (36.8 C) (Oral)   Ht 5\' 8"  (1.727 m)   Wt 229 lb 4 oz (104 kg)   SpO2 97%   BMI 34.86 kg/m     Hearing Screening   125Hz  250Hz  500Hz  1000Hz  2000Hz  3000Hz  4000Hz  6000Hz  8000Hz   Right ear:   40 40 40  0    Left ear:           L ear chronic hearing loss  CC: check R ear Subjective:    Patient ID: Lisa Osborne, female    DOB: 03-Jan-1941, 77 y.o.   MRN: [id number]  HPI: Lisa Osborne is a 76 y.o. female presenting on 09/08/2017 for Ear Check (Here to have right ear checked.)   Notices sudden turn to the right flares vertigo. Some headache behind R ear - only notes this when she uses her plastic reading glasses in evenings. Mild tinnitus that may be worsening. She does swim regularly. No congestion, hearing changes or muffled hearing. No fevers.  Chronic L hearing loss for years, unclear cause.  Lacerations to R hand and R 5th toe s/p dermabond - healing well. No signs of infection.   Relevant past medical, surgical, family and social history reviewed and updated as indicated. Interim medical history since our last visit reviewed. Allergies and medications reviewed and updated. Outpatient Medications Prior to Visit  Medication Sig Dispense Refill  . APPLE CIDER VINEGAR PO Take 1 capsule by mouth daily.    . Calcium Citrate-Vitamin D (CALCIUM CITRATE + D PO) Take 1 tablet by mouth daily.     . Coenzyme Q10 (COQ10) 200 MG CAPS Take 1 capsule by mouth daily.     . Ferrous Sulfate (IRON) 325 (65 Fe) MG TABS Take by mouth daily.    . lansoprazole (PREVACID) 15 MG capsule Take 1 capsule (15 mg total) by mouth daily. 90 capsule 3  . levothyroxine (SYNTHROID, LEVOTHROID) 25 MCG tablet Take 1 tablet (25 mcg total) by mouth daily. 90 tablet 3  . loratadine (CLARITIN) 10 MG tablet Take 10 mg by mouth daily as needed for allergies. Reported on 03/17/2015    . Melatonin  5 MG TABS Take 5 mg by mouth at bedtime.     . Omega-3 Fatty Acids (FISH OIL) 1000 MG CAPS Take 1 capsule by mouth daily.    . sertraline (ZOLOFT) 100 MG tablet Take 1 tablet (100 mg total) by mouth daily. 90 tablet 3  . vitamin B-12 (CYANOCOBALAMIN) 1000 MCG tablet Take 1,000 mcg by mouth daily.     No facility-administered medications prior to visit.      Per HPI unless specifically indicated in ROS section below Review of Systems     Objective:    BP 124/70 (BP Location: Left Arm, Patient Position: Sitting, Cuff Size: Large)   Pulse 74   Temp 98.2 F (36.8 C) (Oral)   Ht 5\' 8"  (1.727 m)   Wt 229 lb 4 oz (104 kg)   SpO2 97%   BMI 34.86 kg/m   Wt Readings from Last 3 Encounters:  09/08/17 229 lb 4 oz (104 kg)  08/26/17 229 lb (103.9 kg)  08/19/17 230 lb 8 oz (104.6 kg)    Physical Exam  Constitutional: She appears well-developed and well-nourished. No distress.  HENT:  Head: Normocephalic and atraumatic.  Right Ear: Tympanic membrane, external ear and ear canal normal. No  decreased hearing is noted.  Left Ear: Tympanic membrane, external ear and ear canal normal. Decreased hearing (chronic) is noted.  Nose: Nose normal. No mucosal edema or rhinorrhea. Right sinus exhibits no frontal sinus tenderness. Left sinus exhibits no maxillary sinus tenderness and no frontal sinus tenderness.  Mouth/Throat: Uvula is midline, oropharynx is clear and moist and mucous membranes are normal.  No external ear pain No rash around ears  Neurological: She is alert.  + dix hallpike on left epleys performed x1  Nursing note and vitals reviewed.     Assessment & Plan:   Problem List Items Addressed This Visit    Chronic right ear pain    Overall benign exam. ?related to poorly fitting plastic glasses she uses to read at night. rec cushion for these.       BPV (benign positional vertigo) - Primary    Anticipate left sided. Epley performed x1 and pt tolerated well. Will provide with  modified epleys to perform at home. If no better with this, she will let me know for ENT referral. Pt agrees with plan.           No orders of the defined types were placed in this encounter.  No orders of the defined types were placed in this encounter.   Follow up plan: Return if symptoms worsen or fail to improve.  Ria Bush, MD

## 2017-09-08 NOTE — Patient Instructions (Addendum)
Lacerations are healing well. Get cushion for ear when using reading glasses.  I think you also have benign positional vertigo - do exercises provided today. If no better with this, let me know for referral to ear doctor.   Benign Positional Vertigo Vertigo is the feeling that you or your surroundings are moving when they are not. Benign positional vertigo is the most common form of vertigo. The cause of this condition is not serious (is benign). This condition is triggered by certain movements and positions (is positional). This condition can be dangerous if it occurs while you are doing something that could endanger you or others, such as driving. What are the causes? In many cases, the cause of this condition is not known. It may be caused by a disturbance in an area of the inner ear that helps your brain to sense movement and balance. This disturbance can be caused by a viral infection (labyrinthitis), head injury, or repetitive motion. What increases the risk? This condition is more likely to develop in:  Women.  People who are 22 years of age or older.  What are the signs or symptoms? Symptoms of this condition usually happen when you move your head or your eyes in different directions. Symptoms may start suddenly, and they usually last for less than a minute. Symptoms may include:  Loss of balance and falling.  Feeling like you are spinning or moving.  Feeling like your surroundings are spinning or moving.  Nausea and vomiting.  Blurred vision.  Dizziness.  Involuntary eye movement (nystagmus).  Symptoms can be mild and cause only slight annoyance, or they can be severe and interfere with daily life. Episodes of benign positional vertigo may return (recur) over time, and they may be triggered by certain movements. Symptoms may improve over time. How is this diagnosed? This condition is usually diagnosed by medical history and a physical exam of the head, neck, and ears. You  may be referred to a health care provider who specializes in ear, nose, and throat (ENT) problems (otolaryngologist) or a provider who specializes in disorders of the nervous system (neurologist). You may have additional testing, including:  MRI.  A CT scan.  Eye movement tests. Your health care provider may ask you to change positions quickly while he or she watches you for symptoms of benign positional vertigo, such as nystagmus. Eye movement may be tested with an electronystagmogram (ENG), caloric stimulation, the Dix-Hallpike test, or the roll test.  An electroencephalogram (EEG). This records electrical activity in your brain.  Hearing tests.  How is this treated? Usually, your health care provider will treat this by moving your head in specific positions to adjust your inner ear back to normal. Surgery may be needed in severe cases, but this is rare. In some cases, benign positional vertigo may resolve on its own in 2-4 weeks. Follow these instructions at home: Safety  Move slowly.Avoid sudden body or head movements.  Avoid driving.  Avoid operating heavy machinery.  Avoid doing any tasks that would be dangerous to you or others if a vertigo episode would occur.  If you have trouble walking or keeping your balance, try using a cane for stability. If you feel dizzy or unstable, sit down right away.  Return to your normal activities as told by your health care provider. Ask your health care provider what activities are safe for you. General instructions  Take over-the-counter and prescription medicines only as told by your health care provider.  Avoid certain  positions or movements as told by your health care provider.  Drink enough fluid to keep your urine clear or pale yellow.  Keep all follow-up visits as told by your health care provider. This is important. Contact a health care provider if:  You have a fever.  Your condition gets worse or you develop new  symptoms.  Your family or friends notice any behavioral changes.  Your nausea or vomiting gets worse.  You have numbness or a "pins and needles" sensation. Get help right away if:  You have difficulty speaking or moving.  You are always dizzy.  You faint.  You develop severe headaches.  You have weakness in your legs or arms.  You have changes in your hearing or vision.  You develop a stiff neck.  You develop sensitivity to light. This information is not intended to replace advice given to you by your health care provider. Make sure you discuss any questions you have with your health care provider. Document Released: 10/19/2005 Document Revised: 06/19/2015 Document Reviewed: 05/06/2014 Elsevier Interactive Patient Education  [employer].

## 2017-10-05 ENCOUNTER — Other Ambulatory Visit: Payer: Self-pay | Admitting: Emergency Medicine

## 2017-10-05 DIAGNOSIS — C189 Malignant neoplasm of colon, unspecified: Secondary | ICD-10-CM

## 2017-10-06 ENCOUNTER — Inpatient Hospital Stay: Payer: Medicare Other

## 2017-10-06 ENCOUNTER — Telehealth: Payer: Self-pay | Admitting: Oncology

## 2017-10-06 ENCOUNTER — Inpatient Hospital Stay: Payer: Medicare Other | Attending: Oncology | Admitting: Oncology

## 2017-10-06 VITALS — BP 157/84 | HR 69 | Temp 98.3°F | Resp 18 | Ht 68.0 in | Wt 229.4 lb

## 2017-10-06 DIAGNOSIS — Z85038 Personal history of other malignant neoplasm of large intestine: Secondary | ICD-10-CM | POA: Insufficient documentation

## 2017-10-06 DIAGNOSIS — E119 Type 2 diabetes mellitus without complications: Secondary | ICD-10-CM | POA: Insufficient documentation

## 2017-10-06 DIAGNOSIS — K5730 Diverticulosis of large intestine without perforation or abscess without bleeding: Secondary | ICD-10-CM | POA: Diagnosis not present

## 2017-10-06 DIAGNOSIS — C189 Malignant neoplasm of colon, unspecified: Secondary | ICD-10-CM

## 2017-10-06 LAB — CEA (IN HOUSE-CHCC): CEA (CHCC-IN HOUSE): 2.45 ng/mL (ref 0.00–5.00)

## 2017-10-06 NOTE — Telephone Encounter (Signed)
Scheduled appt per 9/12 los - gave patient AVS and calender per los.   

## 2017-10-06 NOTE — Progress Notes (Signed)
  [facility] OFFICE PROGRESS NOTE   Diagnosis: Colon cancer  INTERVAL HISTORY:   Lisa Osborne turns as scheduled.  She feels well.  Good appetite.  She reports intentional weight loss.  No complaint. She underwent a colonoscopy 02/15/2017.  There was diverticulosis in the ascending colon.  Objective:  Vital signs in last 24 hours:  Blood pressure (!) 157/84, pulse 69, temperature 98.3 F (36.8 C), temperature source Oral, resp. rate 18, height 5' 8" (1.727 m), weight 229 lb 6.4 oz (104.1 kg), SpO2 97 %.    HEENT: Neck without mass Lymphatics: No cervical, supraclavicular, axillary, or inguinal nodes Resp: Lungs clear bilaterally Cardio: Regular rate and rhythm GI: No hepatosplenomegaly, nontender, no mass Vascular: No leg edema  Lab Results:   Lab Results  Component Value Date   CEA1 2.45 10/06/2017    Medications: I have reviewed the patient's current medications.   Assessment/Plan: 1. Moderately differentiated adenocarcinoma of the rectosigmoid colon, microsatellite stable, stage IIIB (T4 N1) status post low anterior resection on 04/14/2012. She completed cycle 1 adjuvant Xeloda 05/15/2012. Cycle 2 held due to to diarrhea. She completed cycle 2 at a reduced dose (1500 mg twice daily for 14 days) beginning 06/21/2012. She began cycle 3 on 07/12/2012, cycle 4 08/02/2012, cycle 5 08/30/2012, cycle 6 10/03/2012(dose reduced to 1500 mg in the morning and 1000 mg in the evening due to hand-foot syndrome). Cycle 7 10/24/2012. Cycle 8 completed beginning 11/14/2012.  CTs of chest, abdomen, and pelvis 03/05/2013-negative.  Colonoscopy 04/05/2013. 2 sessile polyps measuring 3 mm in size were found in the descending colon (small tubular adenoma admixed with a fragment of benign colonic mucosa. No high-grade dysplasia or invasive malignancy). Mild diverticulosis noted in the descending colon. Colon otherwise normal. Next colonoscopy in 3 years.  CTs  03/11/2014-negative  CTs 03/06/2015-negative  Colonoscopy 04/12/2016 sessile polyps ascending colon; 5 mm polyp hepatic flexure. 2 sessile polyps in the transverse colon. Pathology showed tubular adenomas.  Colonoscopy 02/15/2017-no polyps 2. Elevated preoperative CEA. 3. Diabetes. 4. History of anemia. Hemoglobin was lower on 09/09/2014. Oral iron initiated following that visit. Hemoglobin has corrected into the normal range. Stool Hemoccult cards negative, urinalysis negative for blood  5. Palpitations-an EKG reveals a sinus arrhythmia. It was recommended she follow-up with Dr. Danise Mina to evaluate the palpitations     Disposition: Lisa Osborne remains in clinical remission from colon cancer.  She would like to continue follow-up at the Cancer center.  She will return for an office visit and CEA in 1 year.  15 minutes were spent with the patient today.  The majority of the time was used for counseling and coordination of care.  Lisa Coder, MD  10/06/2017  3:45 PM

## 2017-12-08 ENCOUNTER — Telehealth: Payer: Self-pay | Admitting: Family Medicine

## 2017-12-08 NOTE — Telephone Encounter (Signed)
Spoke with pt confirming which [facility] she wants to use.  States it is corner of [place].  I added [facility] and deleted [facility], per pt request.

## 2017-12-08 NOTE — Telephone Encounter (Signed)
Patient has moved and would like her pharmacy changed to [facility].

## 2017-12-24 IMAGING — CT CT ABD-PELV W/ CM
1 of 3 series · 13 of 32 positions shown, 18 images · IV contrast (omnipaque)
Comparison: 03/11/2014

CLINICAL DATA: Colon cancer.

EXAM:
CT CHEST, ABDOMEN, AND PELVIS WITH CONTRAST
TECHNIQUE: Multidetector CT imaging of the chest, abdomen and pelvis was
performed following the standard protocol during bolus
administration of intravenous contrast.
CONTRAST:  80mL OMNIPAQUE IOHEXOL 300 MG/ML  SOLN

[Series 2: cap with · axial · 0.93mm/px · z∈[-692,-67]mm · 13 of 143 slices shown, 18 images]
[im 9/143  soft-tissue]
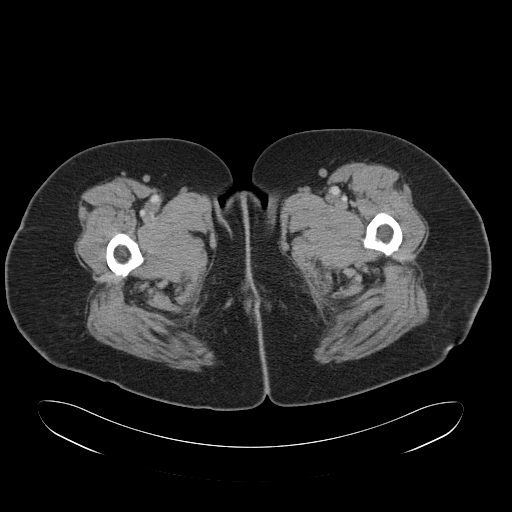
[im 9/143  bone]
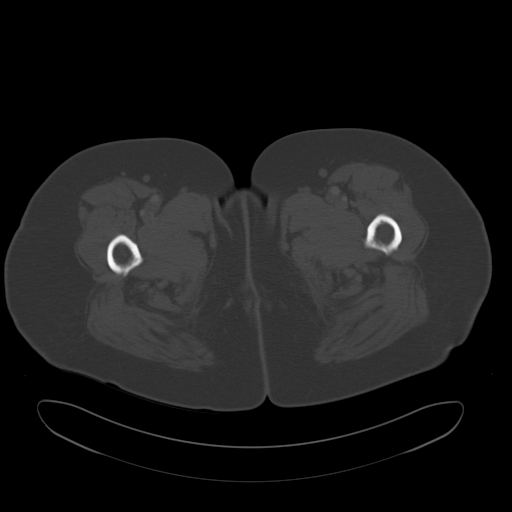
[im 26/143  soft-tissue]
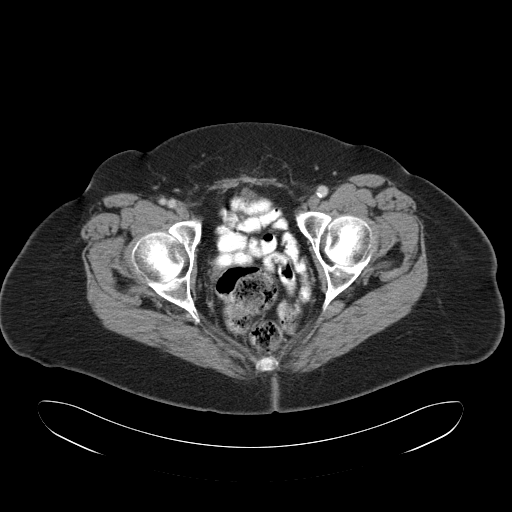
[im 34/143  soft-tissue]
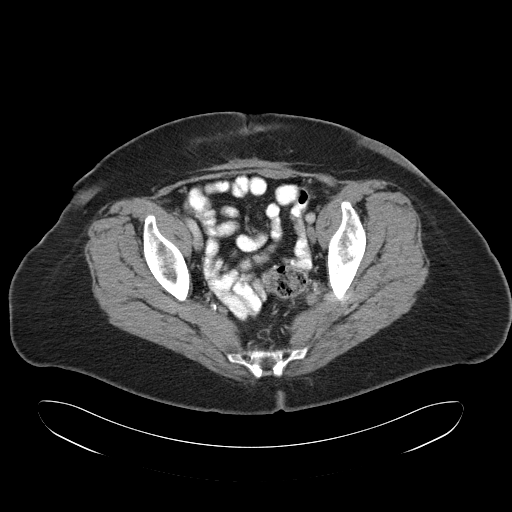
[im 42/143  soft-tissue]
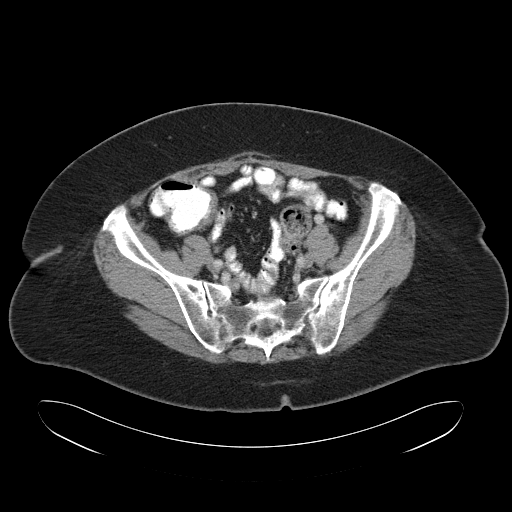
[im 59/143  soft-tissue]
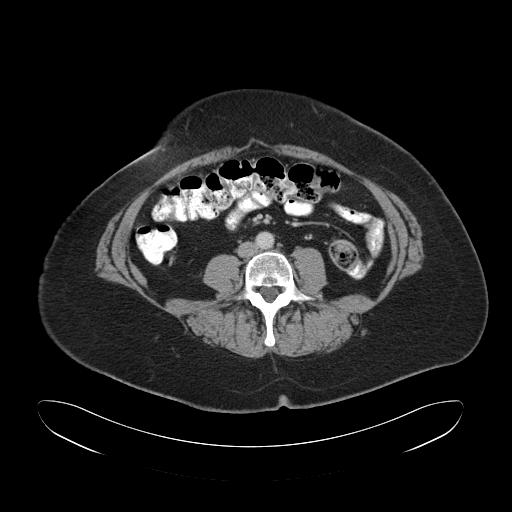
[im 67/143  soft-tissue]
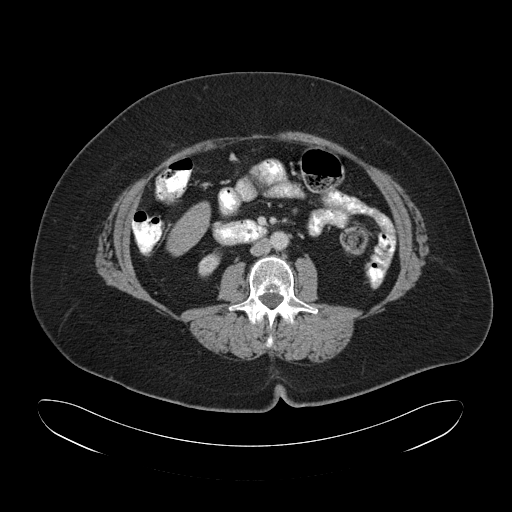
[im 76/143  soft-tissue]
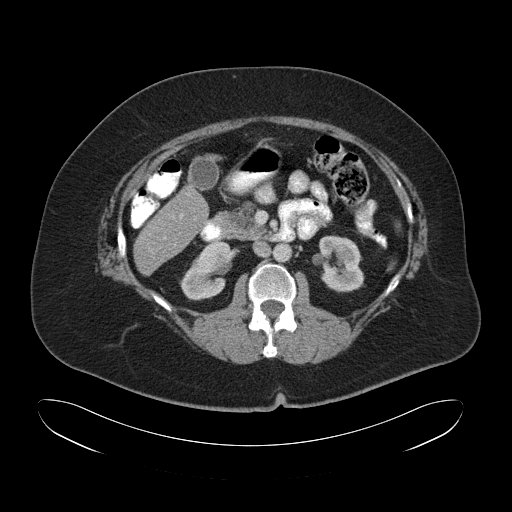
[im 92/143  soft-tissue]
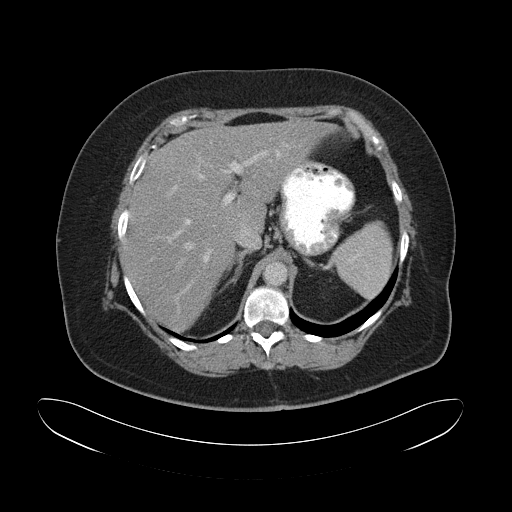
[im 101/143  soft-tissue]
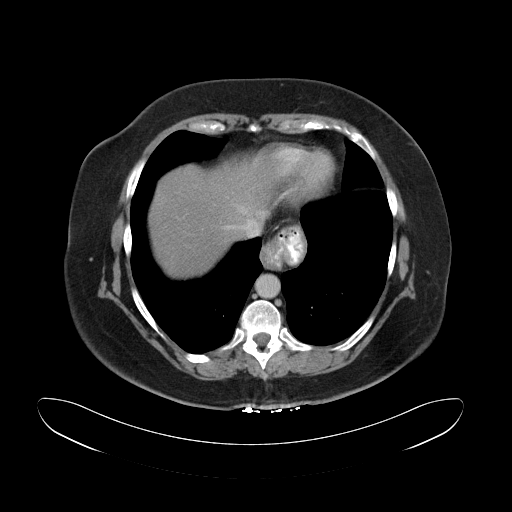
[im 101/143  bone]
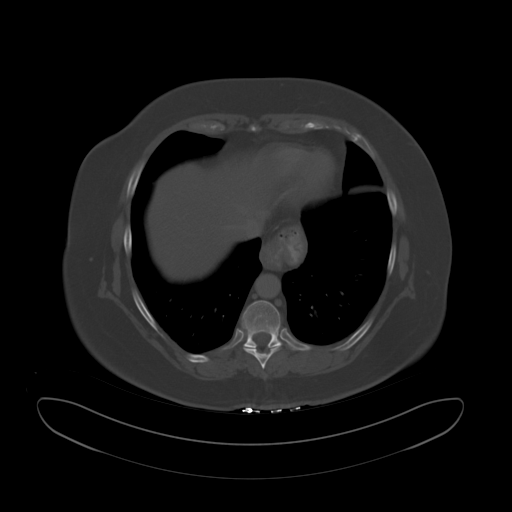
[im 109/143  soft-tissue]
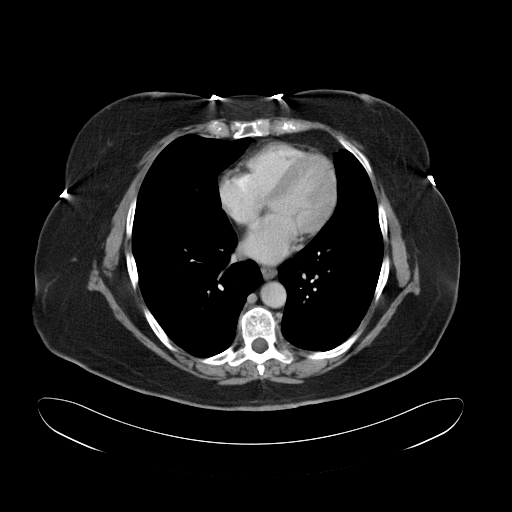
[im 109/143  lung]
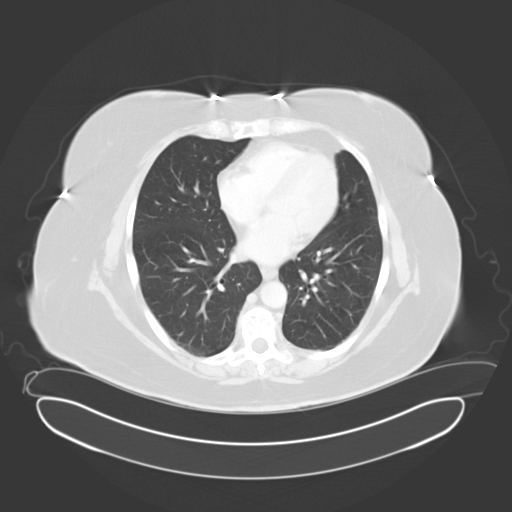
[im 117/143  lung]
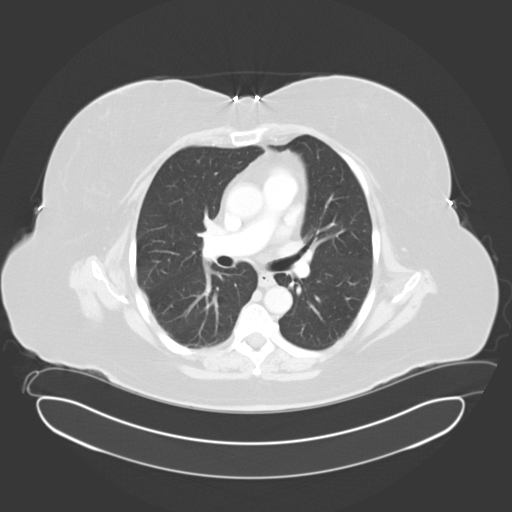
[im 126/143  soft-tissue]
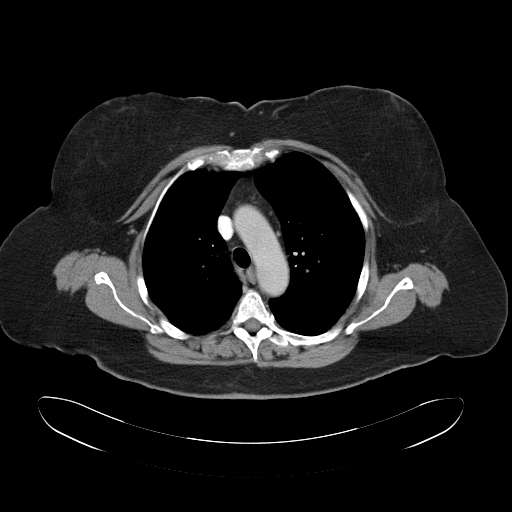
[im 126/143  lung]
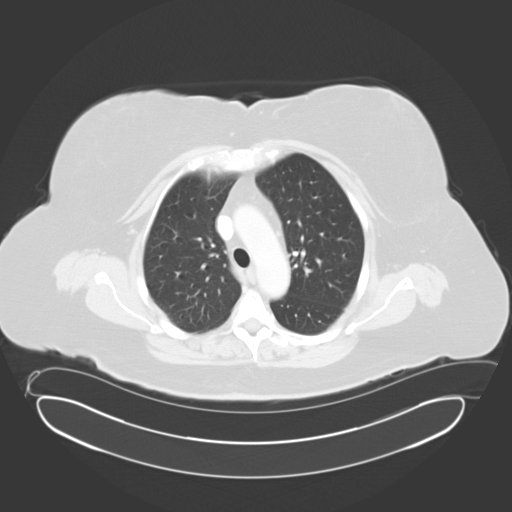
[im 134/143  soft-tissue]
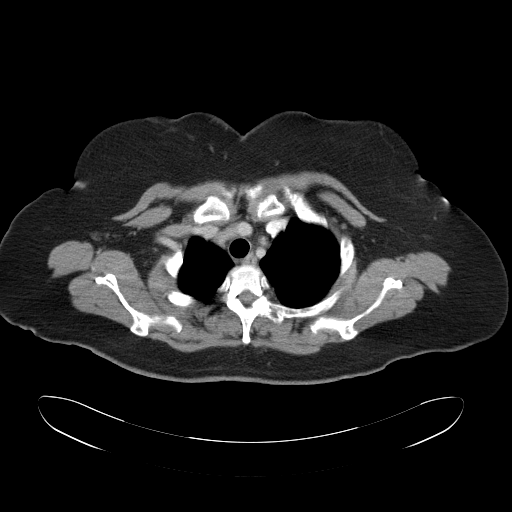
[im 134/143  lung]
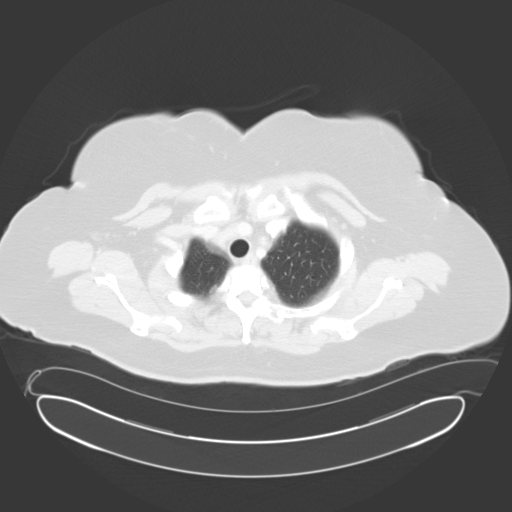

[13 of 32 positions shown; findings below may reference images not displayed]

FINDINGS: CT CHEST

Mediastinum: The heart size appears normal. There is no pericardial
effusion identified. The trachea appears patent and is midline.
Small hiatal hernia identified. No mediastinal or hilar adenopathy.

Lungs/Pleura: No pleural fluid identified. There is no airspace
consolidation or atelectasis identified no suspicious pulmonary
parenchymal nodule or mass identified

Musculoskeletal: The bones appear osteopenic. No aggressive lytic or
sclerotic bone lesions.

CT ABDOMEN AND PELVIS

Hepatobiliary: Within the lateral segment of the liver there is a
2.5 cm low-attenuation structure, image 47 of series 2. This is
unchanged from previous exam. Likely cyst. Smaller adjacent cyst is
also unchanged measuring 8 mm. Stable 7 mm right lobe of liver cyst,
image 59 of series 2. No suspicious liver lesions identified. The
gallbladder is normal.

Pancreas: Normal appearance of the pancreas.

Spleen: The spleen is unremarkable.

Adrenals/Urinary Tract: Normal adrenal glands. Mild bilateral
cortical scarring and volume loss. No obstructive uropathy. The
urinary bladder appears normal.

Stomach/Bowel: Small hiatal hernia. No pathologic dilatation of the
small bowel loops. The colon also has a normal caliber. There are
postoperative changes involving the sigmoid colon from previous
colectomy. No findings to suggest local tumor recurrence.

Vascular/Lymphatic: Calcified atherosclerotic disease involves the
abdominal aorta. No aneurysm. No enlarged retroperitoneal or
mesenteric adenopathy. No enlarged pelvic or inguinal lymph nodes.

Reproductive: Previous hysterectomy.  No adnexal mass.

Other: There is no ascites or focal fluid collections within the
abdomen or pelvis. No peritoneal nodule or mass identified.

Musculoskeletal: No aggressive lytic or sclerotic bone lesions.
IMPRESSION: 1. No findings identified to suggest residual or recurrence of tumor
within the chest, abdomen or pelvis.
2. Stable liver cysts
3. Hepatic steatosis
4. Aortic atherosclerosis.

## 2018-01-06 ENCOUNTER — Other Ambulatory Visit: Payer: Self-pay | Admitting: Family Medicine

## 2018-01-06 DIAGNOSIS — Z1231 Encounter for screening mammogram for malignant neoplasm of breast: Secondary | ICD-10-CM

## 2018-02-13 ENCOUNTER — Encounter: Payer: Self-pay | Admitting: Family Medicine

## 2018-02-13 ENCOUNTER — Ambulatory Visit
Admission: RE | Admit: 2018-02-13 | Discharge: 2018-02-13 | Disposition: A | Discharge status: Home / Self Care | Payer: Medicare Other | Source: Ambulatory Visit | Attending: Family Medicine | Admitting: Family Medicine

## 2018-02-13 DIAGNOSIS — Z1231 Encounter for screening mammogram for malignant neoplasm of breast: Secondary | ICD-10-CM | POA: Diagnosis present

## 2018-02-13 LAB — HM MAMMOGRAPHY

## 2018-03-26 DIAGNOSIS — T63331A Toxic effect of venom of brown recluse spider, accidental (unintentional), initial encounter: Secondary | ICD-10-CM

## 2018-03-26 HISTORY — DX: Toxic effect of venom of brown recluse spider, accidental (unintentional), initial encounter: T63.331A

## 2018-08-19 ENCOUNTER — Other Ambulatory Visit: Payer: Self-pay | Admitting: Family Medicine

## 2018-08-19 DIAGNOSIS — E785 Hyperlipidemia, unspecified: Secondary | ICD-10-CM

## 2018-08-19 DIAGNOSIS — R7303 Prediabetes: Secondary | ICD-10-CM

## 2018-08-19 DIAGNOSIS — E039 Hypothyroidism, unspecified: Secondary | ICD-10-CM

## 2018-08-21 ENCOUNTER — Ambulatory Visit (INDEPENDENT_AMBULATORY_CARE_PROVIDER_SITE_OTHER): Payer: Medicare Other

## 2018-08-21 VITALS — Ht 69.0 in | Wt 233.0 lb

## 2018-08-21 DIAGNOSIS — Z0000 Encounter for general adult medical examination without abnormal findings: Secondary | ICD-10-CM | POA: Diagnosis not present

## 2018-08-21 NOTE — Progress Notes (Signed)
Subjective:   Lisa Osborne is a 78 y.o. female who presents for Medicare Annual (Subsequent) preventive examination.  This visit type was conducted due to national recommendations for restrictions regarding the COVID-19 Pandemic (e.g. social distancing). This format is felt to be most appropriate for this patient at this time. All issues noted in this document were discussed and addressed. No physical exam was performed (except for noted visual exam findings with Video Visits). This patient, Lisa Osborne, has given permission to perform this visit via telephone. Vital signs may be absent or patient reported.  Patient location:  At home  Nurse location:  At home     Review of Systems:  n/a Cardiac Risk Factors include: advanced age (>10mn, >>110women);diabetes mellitus;dyslipidemia;obesity (BMI >30kg/m2)     Objective:     Vitals: Ht _0  (1.753 m) Comment: per patient  Wt 233 lb (105.7 kg) Comment: per patient  BMI 34.41 kg/m   Body mass index is 34.41 kg/m.  Advanced Directives 08/21/2018 08/19/2017 06/17/2016 03/12/2016 08/11/2015 07/25/2015 03/18/2015  Does Patient Have a Medical Advance Directive? _1  Yes Yes  Type of [place]aramed[place]of [place] will [place]Living will [place]mouth ForesideLiving will [place]Living will - HOccidentalLiving will -  Does patient want to make changes to medical advance directive? - - - - - No - Patient declined No - Patient declined  Copy of HC[place]isin Chart? Yes - validated most recent copy scanned in chart (See row information) Yes No - copy requested No - copy requested No - copy requested No - copy requested -  Would patient like information on creating a medical advance directive? - - - - - - -    Tobacco Social History   Tobacco Use  Smoking Status Former Smoker  . Quit date: 04/06/1967  .  Years since quitting: 51.4  Smokeless Tobacco Never Used     Counseling given: Not Answered   Clinical Intake:  Pre-visit preparation completed: Yes  Pain : No/denies pain     Nutritional Status: BMI > 30  Obese Nutritional Risks: None Diabetes: Yes CBG done?: No Did pt. bring in CBG monitor from home?: No  How often do you need to have someone help you when you read instructions, pamphlets, or other written materials from your doctor or pharmacy?: 1 - Never What is the last grade level you completed in school?: college  Interpreter Needed?: No  Information entered by :: NAllen LPN  Past Medical History:  Diagnosis Date  . Abnormal EKG    incomplete RBBB  . Acquired deafness of left ear   . Acute cholecystitis   . Allergy   . Anemia   . Anxiety   . Arthritis    left side lower back pain  . Colon adenocarcinoma - rectosigmoid T3N1 (1/15) s/p lap LAR 04/14/2012   Status post laparoscopic lower anterior resection  . Dehydration 02/2015  . Depression    severe in past  . Diabetes mellitus type 2, controlled ([place] 2006   diet controlled  . GERD (gastroesophageal reflux disease)    bad if off PPI  . History of chicken pox   . History of iron deficiency anemia   . HLD (hyperlipidemia) 08/03/2015  . Hypothyroidism    mild  . Rosacea   . Seasonal allergies   . Unilateral deafnesses    left ear deaf, wears hearing  aid right side  . Urine incontinence    mild, wears pad   Past Surgical History:  Procedure Laterality Date  . ABDOMINOPLASTY  1996  . CHOLECYSTECTOMY N/A 03/20/2015   Procedure: LAPAROSCOPIC CHOLECYSTECTOMY;  Surgeon: Florene Glen, MD;  Location: [facility] ORS;  Service: General;  Laterality: N/A;  . COLONOSCOPY  08/2006   normal  . COLONOSCOPY  03/2013   2 polyps, mild diverticulosis, rpt 3 yrs [name])  . COLONOSCOPY  03/2016   5 TAs, fair prep, patent anastomosis, rpt 6-12 mo ([facility])  . COLONOSCOPY  01/2017   diverticulosis, healthy  anastomosis, rpt 5 yrs (Armbruster)  . FACELIFT  05/2011  . INCONTINENCE SURGERY  1996  . LAPAROSCOPIC LOW ANTERIOR RESECTION N/A 04/14/2012   colon cancer at rectosigmoid junction s/p rigid proctoscopy splenic flexure mobilization lysis of adhesion;  Surgeon: Adin Hector, MD;   . MOUTH SURGERY     crown repair  . POLYPECTOMY    . TOTAL VAGINAL HYSTERECTOMY  1985   cancer cells on cervix   Family History  Problem Relation Age of Onset  . Breast cancer Mother 69       breast cancer then multiple myeloma  . Cancer Mother        breast  . Alcohol abuse Father   . Heart disease Father        heart failure  . Mental illness Brother        2 brothers severe depression, suicide x2  . Diabetes Maternal Aunt   . Diabetes Maternal Uncle   . Diabetes Paternal Uncle   . Diabetes Paternal Grandfather   . Coronary artery disease Neg Hx   . Stroke Neg Hx   . Colon cancer Neg Hx   . Esophageal cancer Neg Hx   . Rectal cancer Neg Hx   . Stomach cancer Neg Hx   . Liver disease Neg Hx   . Pancreatic cancer Neg Hx   . Prostate cancer Neg Hx    Social History   Socioeconomic History  . Marital status: Single    Spouse name: Not on file  . Number of children: 1  . Years of education: Not on file  . Highest education level: Not on file  Occupational History  . Occupation: Retired Product manager: RETIRED  Social Needs  . Financial resource strain: Not hard at all  . Food insecurity    Worry: Never true    Inability: Never true  . Transportation needs    Medical: No    Non-medical: No  Tobacco Use  . Smoking status: Former Smoker    Quit date: 04/06/1967    Years since quitting: 51.4  . Smokeless tobacco: Never Used  Substance and Sexual Activity  . Alcohol use: Yes    Alcohol/week: 1.0 standard drinks    Types: 1 Shots of liquor per week    Comment: 1 per week  . Drug use: No  . Sexual activity: Not Currently  Lifestyle  . Physical activity    Days per week: 2 days     Minutes per session: 60 min  . Stress: Not at all  Relationships  . Social Herbalist on phone: Not on file    Gets together: Not on file    Attends religious service: Not on file    Active member of club or organization: Not on file    Attends meetings of clubs or organizations: Not on file  Relationship status: Not on file  Other Topics Concern  . Not on file  Social History Narrative   Caffeine: 1 cup coffee/day   Lives alone.  1 daughter Lisa Osborne), 3 stillborns   Occupation: retired, was Chief Technology Officer.   Activity: no regular activity but does park far away, has treadmill at home   Diet: good fruits/vegetables, more fruit/vegetable smoothies  good amt water    Outpatient Encounter Medications as of 08/21/2018  Medication Sig  . APPLE CIDER VINEGAR PO Take 1 capsule by mouth daily.  . Calcium Citrate-Vitamin D (CALCIUM CITRATE + D PO) Take 1 tablet by mouth daily.   . Coenzyme Q10 (COQ10) 200 MG CAPS Take 1 capsule by mouth daily.   . Ferrous Sulfate (IRON) 325 (65 Fe) MG TABS Take by mouth daily.  . lansoprazole (PREVACID) 15 MG capsule Take 1 capsule (15 mg total) by mouth daily.  Marland Kitchen levothyroxine (SYNTHROID, LEVOTHROID) 25 MCG tablet Take 1 tablet (25 mcg total) by mouth daily.  Marland Kitchen loratadine (CLARITIN) 10 MG tablet Take 10 mg by mouth daily as needed for allergies. Reported on 03/17/2015  . Melatonin 5 MG TABS Take 5 mg by mouth at bedtime.   . Omega-3 Fatty Acids (FISH OIL) 1000 MG CAPS Take 1 capsule by mouth daily.  . sertraline (ZOLOFT) 100 MG tablet Take 1 tablet (100 mg total) by mouth daily.  . vitamin B-12 (CYANOCOBALAMIN) 1000 MCG tablet Take 1,000 mcg by mouth daily.   No facility-administered encounter medications on file as of 08/21/2018.     Activities of Daily Living In your present state of health, do you have any difficulty performing the following activities: 08/21/2018  Hearing? Y  Comment deaf in left ear  Vision? N  Difficulty concentrating  or making decisions? N  Walking or climbing stairs? N  Dressing or bathing? N  Doing errands, shopping? N  Preparing Food and eating ? N  Using the Toilet? N  In the past six months, have you accidently leaked urine? Y  Comment daily, wears a pad  Do you have problems with loss of bowel control? N  Managing your Medications? N  Managing your Finances? N  Housekeeping or managing your Housekeeping? N  Some recent data might be hidden    Patient Care Team: Ria Bush, MD as PCP - General (Family Medicine) Michael Boston, MD as Consulting Physician (General Surgery) Inda Castle, MD (Inactive) as Consulting Physician (Gastroenterology) Carola Frost, RN as Registered Nurse (Oncology) Ladell Pier, MD as Attending Physician (Medical Oncology) Thelma Comp, Burton as Referring Physician (Optometry) Owens Shark, NP as Nurse Practitioner (Hematology and Oncology) Ladell Pier, MD as Consulting Physician (Oncology)    Assessment:   This is a routine wellness examination for [place].  Exercise Activities and Dietary recommendations Current Exercise Habits: Home exercise routine, Time (Minutes): 60, Frequency (Times/Week): 2, Weekly Exercise (Minutes/Week): 120  Goals    . Increase physical activity     Starting 06/17/2016, I continue to walk at least 30 min 2-3 days per week.     . Increase physical activity     Starting 08/19/2017, I will continue to walk at least 30 minutes twice weekly and to swim for 2 hours weekly.     . Patient Stated     08/21/2018, wants to exercise more by swimming and walking    . Weight (lb) < 200 lb (90.7 kg)     08/21/2018, wants to lose weight  Fall Risk Fall Risk  08/21/2018 08/19/2017 06/17/2016 07/25/2015 06/11/2014  Falls in the past year? 0 Yes Yes Yes Yes  Comment - fell downstairs and caused fractures to 2 toes pt tripped and fell down stairs causing injury to right foot; no medical treatment - -  Number falls in past  yr: - _0 or more 1  Injury with Fall? - Yes Yes Yes Yes  Comment - - - - accident knees gave out.   Risk for fall due to : Medication side effect - - - -  Follow up Falls evaluation completed;Falls prevention discussed - - Falls evaluation completed;Falls prevention discussed;Education provided -   Is the patient's home free of loose throw rugs in walkways, pet beds, electrical cords, etc?   yes      Grab bars in the bathroom? yes      Handrails on the stairs?   n/a      Adequate lighting?   yes  Timed Get Up and Go performed: n/a  Depression Screen PHQ 2/9 Scores 08/21/2018 08/19/2017 06/17/2016 07/25/2015  PHQ - 2 Score 0 0 0 0  PHQ- 9 Score 0 0 - -     Cognitive Function MMSE - Mini Mental State Exam 08/21/2018 08/19/2017 06/17/2016 07/25/2015  Orientation to time _1 Orientation to time comments missed date - - -  Orientation to Place _2 Registration _3 Attention/ Calculation 5 0 0 0  Recall _4 Language- name 2 objects 0 0 0 0  Language- repeat _5 Language- follow 3 step command 0 _6 Language- read & follow direction 0 0 0 0  Write a sentence 0 0 0 0  Copy design 0 0 0 0  Total score _7 Mini Cog  Mini-Cog screen was completed. Maximum score is 22. A value of 0 denotes this part of the MMSE was not completed or the patient failed this part of the Mini-Cog screening.       Immunization History  Administered Date(s) Administered  . Influenza Split 11/26/2010  . Influenza, High Dose Seasonal PF 10/27/2015, 11/01/2016, 10/27/2017  . Influenza,inj,Quad PF,6+ Mos 11/09/2012  . Influenza-Unspecified 12/26/2013  . Pneumococcal Conjugate-13 05/22/2013  . Pneumococcal Polysaccharide-23 01/07/2007  . Td 08/22/2008    Qualifies for Shingles Vaccine? yes  Screening Tests Health Maintenance  Topic Date Due  . URINE MICROALBUMIN  08/20/2018  . DTaP/Tdap/Td (1 - Tdap) 08/23/2018 (Originally 01/06/1960)  . TETANUS/TDAP  08/23/2018   . INFLUENZA VACCINE  08/26/2018  . MAMMOGRAM  02/14/2019  . COLONOSCOPY  02/16/2020  . DEXA SCAN  Completed  . PNA vac Low Risk Adult  Completed    Cancer Screenings: Lung: Low Dose CT Chest recommended if Age 67-80 years, 30 pack-year currently smoking OR have quit w/in 15years. Patient does not qualify. Breast:  Up to date on Mammogram? Yes   Up to date of Bone Density/Dexa? Yes Colorectal: up to date  Additional Screenings: : Hepatitis C Screening: n/a     Plan:   Patient wants to lose weight and exercise more by swimming and walking.  I have personally reviewed and noted the following in the patient's chart:   . Medical and social history . Use of alcohol, tobacco or illicit drugs  . Current medications and supplements . Functional ability and status . Nutritional status . Physical activity . Advanced  directives . List of other physicians . Hospitalizations, surgeries, and ER visits in previous 12 months . Vitals . Screenings to include cognitive, depression, and falls . Referrals and appointments  In addition, I have reviewed and discussed with patient certain preventive protocols, quality metrics, and best practice recommendations. A written personalized care plan for preventive services as well as general preventive health recommendations were provided to patient.     Kellie Simmering, LPN  2/82/0601

## 2018-08-21 NOTE — Progress Notes (Addendum)
PCP notes:  Health Maintenance:  Microalbumin, Tetanus due 08/23/2018  Abnormal Screenings:  None  Patient concerns:  Wants to discuss vertigo. Worse when she turns to the right. Wants to discuss arthritis in knee. Question if time for surgery. Wants her back checked for skin cancer.  Nurse concerns:  None  Next PCP appt.: 08/28/2018 at 2:15

## 2018-08-21 NOTE — Patient Instructions (Signed)
Lisa Osborne , Thank you for taking time to come for your Medicare Wellness Visit. I appreciate your ongoing commitment to your health goals. Please review the following plan we discussed and let me know if I can assist you in the future.   Screening recommendations/referrals: Colonoscopy: 01/2017 Mammogram: 01/2018 Bone Density: 01/2003 Recommended yearly ophthalmology/optometry visit for glaucoma screening and checkup Recommended yearly dental visit for hygiene and checkup  Vaccinations: Influenza vaccine: 10/2017 Pneumococcal vaccine: 04/2013 Tdap vaccine: 07/2008 Shingles vaccine: discussed    Advanced directives: copy in chart  Conditions/risks identified: obese  Next appointment: 08/28/2018 at 2:15   Preventive Care 9 Years and Older, Female Preventive care refers to lifestyle choices and visits with your health care provider that can promote health and wellness. What does preventive care include?  A yearly physical exam. This is also called an annual well check.  Dental exams once or twice a year.  Routine eye exams. Ask your health care provider how often you should have your eyes checked.  Personal lifestyle choices, including:  Daily care of your teeth and gums.  Regular physical activity.  Eating a healthy diet.  Avoiding tobacco and drug use.  Limiting alcohol use.  Practicing safe sex.  Taking low-dose aspirin every day.  Taking vitamin and mineral supplements as recommended by your health care provider. What happens during an annual well check? The services and screenings done by your health care provider during your annual well check will depend on your age, overall health, lifestyle risk factors, and family history of disease. Counseling  Your health care provider may ask you questions about your:  Alcohol use.  Tobacco use.  Drug use.  Emotional well-being.  Home and relationship well-being.  Sexual activity.  Eating habits.  History of  falls.  Memory and ability to understand (cognition).  Work and work Statistician.  Reproductive health. Screening  You may have the following tests or measurements:  Height, weight, and BMI.  Blood pressure.  Lipid and cholesterol levels. These may be checked every 5 years, or more frequently if you are over 63 years old.  Skin check.  Lung cancer screening. You may have this screening every year starting at age 31 if you have a 30-pack-year history of smoking and currently smoke or have quit within the past 15 years.  Fecal occult blood test (FOBT) of the stool. You may have this test every year starting at age 94.  Flexible sigmoidoscopy or colonoscopy. You may have a sigmoidoscopy every 5 years or a colonoscopy every 10 years starting at age 50.  Hepatitis C blood test.  Hepatitis B blood test.  Sexually transmitted disease (STD) testing.  Diabetes screening. This is done by checking your blood sugar (glucose) after you have not eaten for a while (fasting). You may have this done every 1-3 years.  Bone density scan. This is done to screen for osteoporosis. You may have this done starting at age 61.  Mammogram. This may be done every 1-2 years. Talk to your health care provider about how often you should have regular mammograms. Talk with your health care provider about your test results, treatment options, and if necessary, the need for more tests. Vaccines  Your health care provider may recommend certain vaccines, such as:  Influenza vaccine. This is recommended every year.  Tetanus, diphtheria, and acellular pertussis (Tdap, Td) vaccine. You may need a Td booster every 10 years.  Zoster vaccine. You may need this after age 53.  Pneumococcal 13-valent  conjugate (PCV13) vaccine. One dose is recommended after age 63.  Pneumococcal polysaccharide (PPSV23) vaccine. One dose is recommended after age 45. Talk to your health care provider about which screenings and  vaccines you need and how often you need them. This information is not intended to replace advice given to you by your health care provider. Make sure you discuss any questions you have with your health care provider. Document Released: 02/07/2015 Document Revised: 10/01/2015 Document Reviewed: 11/12/2014 Elsevier Interactive Patient Education  2017 [place] Prevention in the Home Falls can cause injuries. They can happen to people of all ages. There are many things you can do to make your home safe and to help prevent falls. What can I do on the outside of my home?  Regularly fix the edges of walkways and driveways and fix any cracks.  Remove anything that might make you trip as you walk through a door, such as a raised step or threshold.  Trim any bushes or trees on the path to your home.  Use bright outdoor lighting.  Clear any walking paths of anything that might make someone trip, such as rocks or tools.  Regularly check to see if handrails are loose or broken. Make sure that both sides of any steps have handrails.  Any raised decks and porches should have guardrails on the edges.  Have any leaves, snow, or ice cleared regularly.  Use sand or salt on walking paths during winter.  Clean up any spills in your garage right away. This includes oil or grease spills. What can I do in the bathroom?  Use night lights.  Install grab bars by the toilet and in the tub and shower. Do not use towel bars as grab bars.  Use non-skid mats or decals in the tub or shower.  If you need to sit down in the shower, use a plastic, non-slip stool.  Keep the floor dry. Clean up any water that spills on the floor as soon as it happens.  Remove soap buildup in the tub or shower regularly.  Attach bath mats securely with double-sided non-slip rug tape.  Do not have throw rugs and other things on the floor that can make you trip. What can I do in the bedroom?  Use night lights.   Make sure that you have a light by your bed that is easy to reach.  Do not use any sheets or blankets that are too big for your bed. They should not hang down onto the floor.  Have a firm chair that has side arms. You can use this for support while you get dressed.  Do not have throw rugs and other things on the floor that can make you trip. What can I do in the kitchen?  Clean up any spills right away.  Avoid walking on wet floors.  Keep items that you use a lot in easy-to-reach places.  If you need to reach something above you, use a strong step stool that has a grab bar.  Keep electrical cords out of the way.  Do not use floor polish or wax that makes floors slippery. If you must use wax, use non-skid floor wax.  Do not have throw rugs and other things on the floor that can make you trip. What can I do with my stairs?  Do not leave any items on the stairs.  Make sure that there are handrails on both sides of the stairs and use them. Fix handrails  that are broken or loose. Make sure that handrails are as long as the stairways.  Check any carpeting to make sure that it is firmly attached to the stairs. Fix any carpet that is loose or worn.  Avoid having throw rugs at the top or bottom of the stairs. If you do have throw rugs, attach them to the floor with carpet tape.  Make sure that you have a light switch at the top of the stairs and the bottom of the stairs. If you do not have them, ask someone to add them for you. What else can I do to help prevent falls?  Wear shoes that:  Do not have high heels.  Have rubber bottoms.  Are comfortable and fit you well.  Are closed at the toe. Do not wear sandals.  If you use a stepladder:  Make sure that it is fully opened. Do not climb a closed stepladder.  Make sure that both sides of the stepladder are locked into place.  Ask someone to hold it for you, if possible.  Clearly mark and make sure that you can see:  Any  grab bars or handrails.  First and last steps.  Where the edge of each step is.  Use tools that help you move around (mobility aids) if they are needed. These include:  Canes.  Walkers.  Scooters.  Crutches.  Turn on the lights when you go into a dark area. Replace any light bulbs as soon as they burn out.  Set up your furniture so you have a clear path. Avoid moving your furniture around.  If any of your floors are uneven, fix them.  If there are any pets around you, be aware of where they are.  Review your medicines with your doctor. Some medicines can make you feel dizzy. This can increase your chance of falling. Ask your doctor what other things that you can do to help prevent falls. This information is not intended to replace advice given to you by your health care provider. Make sure you discuss any questions you have with your health care provider. Document Released: 11/07/2008 Document Revised: 06/19/2015 Document Reviewed: 02/15/2014 Elsevier Interactive Patient Education  2017 [employer].

## 2018-08-22 ENCOUNTER — Other Ambulatory Visit: Payer: Self-pay

## 2018-08-22 ENCOUNTER — Other Ambulatory Visit (INDEPENDENT_AMBULATORY_CARE_PROVIDER_SITE_OTHER): Payer: Medicare Other

## 2018-08-22 DIAGNOSIS — E039 Hypothyroidism, unspecified: Secondary | ICD-10-CM

## 2018-08-22 DIAGNOSIS — E785 Hyperlipidemia, unspecified: Secondary | ICD-10-CM

## 2018-08-22 DIAGNOSIS — R7303 Prediabetes: Secondary | ICD-10-CM

## 2018-08-22 LAB — COMPREHENSIVE METABOLIC PANEL
ALT: 9 U/L (ref 0–35)
AST: 20 U/L (ref 0–37)
Albumin: 4.4 g/dL (ref 3.5–5.2)
Alkaline Phosphatase: 64 U/L (ref 39–117)
BUN: 17 mg/dL (ref 6–23)
CO2: 29 mEq/L (ref 19–32)
Calcium: 9.3 mg/dL (ref 8.4–10.5)
Chloride: 102 mEq/L (ref 96–112)
Creatinine, Ser: 1.02 mg/dL (ref 0.40–1.20)
GFR: 52.6 mL/min — ABNORMAL LOW (ref >60.00)
Glucose, Bld: 136 mg/dL — ABNORMAL HIGH (ref 70–99)
Potassium: 4.4 mEq/L (ref 3.5–5.1)
Sodium: 140 mEq/L (ref 135–145)
Total Bilirubin: 0.6 mg/dL (ref 0.2–1.2)
Total Protein: 7.3 g/dL (ref 6.0–8.3)

## 2018-08-22 LAB — LIPID PANEL
Cholesterol: 222 mg/dL — ABNORMAL HIGH (ref 0–200)
HDL: 61.6 mg/dL (ref >39.00)
LDL Cholesterol: 126 mg/dL — ABNORMAL HIGH (ref 0–99)
NonHDL: 159.91
Total CHOL/HDL Ratio: 4
Triglycerides: 171 mg/dL — ABNORMAL HIGH (ref 0.0–149.0)
VLDL: 34.2 mg/dL (ref 0.0–40.0)

## 2018-08-22 LAB — HEMOGLOBIN A1C: Hgb A1c MFr Bld: 6.4 % (ref 4.6–6.5)

## 2018-08-22 LAB — TSH: TSH: 1.26 u[IU]/mL (ref 0.35–4.50)

## 2018-08-22 LAB — T4, FREE: Free T4: 0.83 ng/dL (ref 0.60–1.60)

## 2018-08-28 ENCOUNTER — Encounter: Payer: Self-pay | Admitting: Family Medicine

## 2018-08-28 ENCOUNTER — Other Ambulatory Visit: Payer: Self-pay

## 2018-08-28 ENCOUNTER — Ambulatory Visit (INDEPENDENT_AMBULATORY_CARE_PROVIDER_SITE_OTHER): Payer: Medicare Other | Admitting: Family Medicine

## 2018-08-28 VITALS — BP 126/80 | HR 74 | Temp 97.6°F | Ht 67.75 in | Wt 232.6 lb

## 2018-08-28 DIAGNOSIS — F331 Major depressive disorder, recurrent, moderate: Secondary | ICD-10-CM

## 2018-08-28 DIAGNOSIS — Z0000 Encounter for general adult medical examination without abnormal findings: Secondary | ICD-10-CM

## 2018-08-28 DIAGNOSIS — E669 Obesity, unspecified: Secondary | ICD-10-CM

## 2018-08-28 DIAGNOSIS — N3941 Urge incontinence: Secondary | ICD-10-CM

## 2018-08-28 DIAGNOSIS — C189 Malignant neoplasm of colon, unspecified: Secondary | ICD-10-CM | POA: Diagnosis not present

## 2018-08-28 DIAGNOSIS — H8110 Benign paroxysmal vertigo, unspecified ear: Secondary | ICD-10-CM

## 2018-08-28 DIAGNOSIS — K219 Gastro-esophageal reflux disease without esophagitis: Secondary | ICD-10-CM

## 2018-08-28 DIAGNOSIS — R7303 Prediabetes: Secondary | ICD-10-CM

## 2018-08-28 DIAGNOSIS — Z7189 Other specified counseling: Secondary | ICD-10-CM

## 2018-08-28 DIAGNOSIS — E785 Hyperlipidemia, unspecified: Secondary | ICD-10-CM

## 2018-08-28 DIAGNOSIS — Z0001 Encounter for general adult medical examination with abnormal findings: Secondary | ICD-10-CM | POA: Diagnosis not present

## 2018-08-28 DIAGNOSIS — N183 Chronic kidney disease, stage 3 unspecified: Secondary | ICD-10-CM

## 2018-08-28 DIAGNOSIS — M170 Bilateral primary osteoarthritis of knee: Secondary | ICD-10-CM | POA: Diagnosis not present

## 2018-08-28 DIAGNOSIS — E039 Hypothyroidism, unspecified: Secondary | ICD-10-CM

## 2018-08-28 NOTE — Patient Instructions (Addendum)
For ongoing vertigo we will refer you to vestibular rehab.  If interested, check with pharmacy about new 2 shot shingles series (shingrix).  You are doing well today.  Return as needed or in 1 year for next wellness visit/physical.   Health Maintenance After Age 77 After age 16, you are at a higher risk for certain long-term diseases and infections as well as injuries from falls. Falls are a major cause of broken bones and head injuries in people who are older than age 68. Getting regular preventive care can help to keep you healthy and well. Preventive care includes getting regular testing and making lifestyle changes as recommended by your health care provider. Talk with your health care provider about:  Which screenings and tests you should have. A screening is a test that checks for a disease when you have no symptoms.  A diet and exercise plan that is right for you. What should I know about screenings and tests to prevent falls? Screening and testing are the best ways to find a health problem early. Early diagnosis and treatment give you the best chance of managing medical conditions that are common after age 59. Certain conditions and lifestyle choices may make you more likely to have a fall. Your health care provider may recommend:  Regular vision checks. Poor vision and conditions such as cataracts can make you more likely to have a fall. If you wear glasses, make sure to get your prescription updated if your vision changes.  Medicine review. Work with your health care provider to regularly review all of the medicines you are taking, including over-the-counter medicines. Ask your health care provider about any side effects that may make you more likely to have a fall. Tell your health care provider if any medicines that you take make you feel dizzy or sleepy.  Osteoporosis screening. Osteoporosis is a condition that causes the bones to get weaker. This can make the bones weak and cause them  to break more easily.  Blood pressure screening. Blood pressure changes and medicines to control blood pressure can make you feel dizzy.  Strength and balance checks. Your health care provider may recommend certain tests to check your strength and balance while standing, walking, or changing positions.  Foot health exam. Foot pain and numbness, as well as not wearing proper footwear, can make you more likely to have a fall.  Depression screening. You may be more likely to have a fall if you have a fear of falling, feel emotionally low, or feel unable to do activities that you used to do.  Alcohol use screening. Using too much alcohol can affect your balance and may make you more likely to have a fall. What actions can I take to lower my risk of falls? General instructions  Talk with your health care provider about your risks for falling. Tell your health care provider if: ? You fall. Be sure to tell your health care provider about all falls, even ones that seem minor. ? You feel dizzy, sleepy, or off-balance.  Take over-the-counter and prescription medicines only as told by your health care provider. These include any supplements.  Eat a healthy diet and maintain a healthy weight. A healthy diet includes low-fat dairy products, low-fat (lean) meats, and fiber from whole grains, beans, and lots of fruits and vegetables. Home safety  Remove any tripping hazards, such as rugs, cords, and clutter.  Install safety equipment such as grab bars in bathrooms and safety rails on stairs.  Keep rooms and walkways well-lit. Activity   Follow a regular exercise program to stay fit. This will help you maintain your balance. Ask your health care provider what types of exercise are appropriate for you.  If you need a cane or walker, use it as recommended by your health care provider.  Wear supportive shoes that have nonskid soles. Lifestyle  Do not drink alcohol if your health care provider  tells you not to drink.  If you drink alcohol, limit how much you have: ? 0-1 drink a day for women. ? 0-2 drinks a day for men.  Be aware of how much alcohol is in your drink. In the [place] one drink equals one typical bottle of beer (12 oz), one-half glass of wine (5 oz), or one shot of hard liquor (1 oz).  Do not use any products that contain nicotine or tobacco, such as cigarettes and e-cigarettes. If you need help quitting, ask your health care provider. Summary  Having a healthy lifestyle and getting preventive care can help to protect your health and wellness after age 83.  Screening and testing are the best way to find a health problem early and help you avoid having a fall. Early diagnosis and treatment give you the best chance for managing medical conditions that are more common for people who are older than age 71.  Falls are a major cause of broken bones and head injuries in people who are older than age 50. Take precautions to prevent a fall at home.  Work with your health care provider to learn what changes you can make to improve your health and wellness and to prevent falls. This information is not intended to replace advice given to you by your health care provider. Make sure you discuss any questions you have with your health care provider. Document Released: 11/24/2016 Document Revised: 05/04/2018 Document Reviewed: 11/24/2016 Elsevier Patient Education  2020 [employer].

## 2018-08-28 NOTE — Assessment & Plan Note (Signed)
Preventative protocols reviewed and updated unless pt declined. Discussed healthy diet and lifestyle.  

## 2018-08-28 NOTE — Progress Notes (Signed)
This visit was conducted in person.  BP 126/80 (BP Location: Left Arm, Patient Position: Sitting, Cuff Size: Large)   Pulse 74   Temp 97.6 F (36.4 C) (Temporal)   Ht 5' 7.75" (1.721 m)   Wt 232 lb 9 oz (105.5 kg)   SpO2 96%   BMI 35.62 kg/m    CC: CPE Subjective:    Patient ID: Lisa Osborne, female    DOB: 1940/02/11, 78 y.o.   MRN: [id number]  HPI: Lisa Osborne is a 78 y.o. female presenting on 08/28/2018 for Annual Exam (Pt 2. )   Saw health advisor last week for medicare wellness visit. Note reviewed.   Noticing vertigo with head movements to the right - ongoing over the past year, may be getting worse. She did previously do repositioning maneuvers with benefit. She has been swimming more - this may worsen vertigo.   Worsening L knee arthritis pain - she did go to flexogenics clinic 1 yr ago with some benefit from lubricating hyaluronic shots. She did have xrays showing L>R osteoarthritis. Swimming may aggravate knee pain. Aleve at night also helps.   Bitten by brown recluse several months ago - treating with salve. This is healing well.   Preventative: Colon cancer s/p lap LAR 03/2012 - clinical remission followed by Dr Benay Spice ONC. COLONOSCOPY Date: 03/2013 2 polyps, mild diverticulosis, rpt 3 yrs Lisa Osborne) COLONOSCOPY 03/2016 5 TAs, fair prep, patent anastomosis, rpt 6-12 mo ([facility]) COLONOSCOPY 01/2017 diverticulosis, healthy anastomosis, rpt 5 yrs ([facility]) Well woman - s/p total hysterectomy 1985, ovaries removed as well.  Mammogram 1/2020Birads1 DEXA - done~2005in [place] and normal per patient.  Fluyearly Tetanus - 2010 Pneumovax 2008, prevnar 2015 Shingrix-discussed. Advanced directive: Received, scanned. HCPOA are daughter [name] or SIL [name]. Does not want prolonged life support if terminal condition but does want to receive artifical nutrition and hydration. If advanced dementia, would grant discretion to [facility]. [place] for  organ donor. Wants to be cremated.  Seat belt use discussed Sunscreen use discussed. Wants mole on back evaluated - growing.  Ex smoker quit 1969 Alcohol - 1 a week  Dentist - Q6 mo Eye exam - yearly Bowels - no constipation Bladder - some dribbling throughout the day - uses small pad.   Caffeine: 1 cup coffee/day Lives alone. 1 daughter [name]), 3 stillborns Occupation: retired, was Chief Technology Officer. Activity: walking and swimming  Diet: good water, fruits/vegetables     Relevant past medical, surgical, family and social history reviewed and updated as indicated. Interim medical history since our last visit reviewed. Allergies and medications reviewed and updated. Outpatient Medications Prior to Visit  Medication Sig Dispense Refill  . APPLE CIDER VINEGAR PO Take 1 capsule by mouth daily.    . Calcium Citrate-Vitamin D (CALCIUM CITRATE + D PO) Take 1 tablet by mouth daily.     . Coenzyme Q10 (COQ10) 200 MG CAPS Take 1 capsule by mouth daily.     . Ferrous Sulfate (IRON) 325 (65 Fe) MG TABS Take by mouth daily.    . lansoprazole (PREVACID) 15 MG capsule Take 1 capsule (15 mg total) by mouth daily. 90 capsule 3  . levothyroxine (SYNTHROID, LEVOTHROID) 25 MCG tablet Take 1 tablet (25 mcg total) by mouth daily. 90 tablet 3  . loratadine (CLARITIN) 10 MG tablet Take 10 mg by mouth daily as needed for allergies. Reported on 03/17/2015    . Melatonin 5 MG TABS Take 5 mg by mouth at bedtime.     Marland Kitchen  Omega-3 Fatty Acids (FISH OIL) 1000 MG CAPS Take 1 capsule by mouth daily.    . sertraline (ZOLOFT) 100 MG tablet Take 1 tablet (100 mg total) by mouth daily. 90 tablet 3  . vitamin B-12 (CYANOCOBALAMIN) 1000 MCG tablet Take 1,000 mcg by mouth daily.     No facility-administered medications prior to visit.      Per HPI unless specifically indicated in ROS section below Review of Systems  Constitutional: Negative for activity change, appetite change, chills, fatigue, fever and unexpected weight  change.  HENT: Negative for hearing loss.   Eyes: Negative for visual disturbance.  Respiratory: Positive for shortness of breath (noticing increased dyspnea with exertion). Negative for cough, chest tightness and wheezing.   Cardiovascular: Negative for chest pain, palpitations and leg swelling.  Gastrointestinal: Negative for abdominal distention, abdominal pain, blood in stool, constipation, diarrhea, nausea and vomiting.  Endocrine:       Noticing hair growth  Genitourinary: Negative for difficulty urinating and hematuria.  Musculoskeletal: Negative for arthralgias, myalgias and neck pain.  Skin: Negative for rash.  Neurological: Negative for dizziness, seizures, syncope and headaches.  Hematological: Negative for adenopathy. Bruises/bleeds easily.  Psychiatric/Behavioral: Negative for dysphoric mood. The patient is not nervous/anxious.    Objective:    BP 126/80 (BP Location: Left Arm, Patient Position: Sitting, Cuff Size: Large)   Pulse 74   Temp 97.6 F (36.4 C) (Temporal)   Ht 5' 7.75" (1.721 m)   Wt 232 lb 9 oz (105.5 kg)   SpO2 96%   BMI 35.62 kg/m   Wt Readings from Last 3 Encounters:  08/28/18 232 lb 9 oz (105.5 kg)  08/21/18 233 lb (105.7 kg)  10/06/17 229 lb 6.4 oz (104.1 kg)    Physical Exam Vitals signs and nursing note reviewed.  Constitutional:      General: She is not in acute distress.    Appearance: Normal appearance. She is well-developed. She is obese. She is not ill-appearing.  HENT:     Head: Normocephalic and atraumatic.     Right Ear: Hearing, tympanic membrane, ear canal and external ear normal.     Left Ear: Hearing, tympanic membrane, ear canal and external ear normal.     Nose: Nose normal.     Mouth/Throat:     Mouth: Mucous membranes are moist.     Pharynx: Uvula midline. No oropharyngeal exudate or posterior oropharyngeal erythema.  Eyes:     General: No scleral icterus.    Extraocular Movements: Extraocular movements intact.      Conjunctiva/sclera: Conjunctivae normal.     Pupils: Pupils are equal, round, and reactive to light.  Neck:     Musculoskeletal: Normal range of motion and neck supple.     Vascular: No carotid bruit.  Cardiovascular:     Rate and Rhythm: Normal rate and regular rhythm.     Pulses: Normal pulses.          Radial pulses are 2+ on the right side and 2+ on the left side.     Heart sounds: Normal heart sounds. No murmur.  Pulmonary:     Effort: Pulmonary effort is normal. No respiratory distress.     Breath sounds: Normal breath sounds. No wheezing, rhonchi or rales.  Abdominal:     General: Abdomen is flat. Bowel sounds are normal. There is no distension.     Palpations: Abdomen is soft. There is no mass.     Tenderness: There is no abdominal tenderness. There is no  guarding or rebound.     Hernia: No hernia is present.  Musculoskeletal: Normal range of motion.  Lymphadenopathy:     Cervical: No cervical adenopathy.  Skin:    General: Skin is warm and dry.     Findings: No rash.     Comments: Healing spider bites to L posterior thigh  Neurological:     General: No focal deficit present.     Mental Status: She is alert and oriented to person, place, and time.     Comments: CN grossly intact, station and gait intact Dix hallpike neg bilaterally, vertigo with very mild horizontal nystagmus present with sitting up  Psychiatric:        Mood and Affect: Mood normal.        Behavior: Behavior normal.        Thought Content: Thought content normal.        Judgment: Judgment normal.       Results for orders placed or performed in visit on 08/22/18  T4, free  Result Value Ref Range   Free T4 0.83 0.60 - 1.60 ng/dL  Hemoglobin A1c  Result Value Ref Range   Hgb A1c MFr Bld 6.4 4.6 - 6.5 %  TSH  Result Value Ref Range   TSH 1.26 0.35 - 4.50 uIU/mL  Comprehensive metabolic panel  Result Value Ref Range   Sodium 140 135 - 145 mEq/L   Potassium 4.4 3.5 - 5.1 mEq/L   Chloride 102 96 -  112 mEq/L   CO2 29 19 - 32 mEq/L   Glucose, Bld 136 (H) 70 - 99 mg/dL   BUN 17 6 - 23 mg/dL   Creatinine, Ser 1.02 0.40 - 1.20 mg/dL   Total Bilirubin 0.6 0.2 - 1.2 mg/dL   Alkaline Phosphatase 64 39 - 117 U/L   AST 20 0 - 37 U/L   ALT 9 0 - 35 U/L   Total Protein 7.3 6.0 - 8.3 g/dL   Albumin 4.4 3.5 - 5.2 g/dL   Calcium 9.3 8.4 - 10.5 mg/dL   GFR 52.60 (L) >60.00 mL/min  Lipid panel  Result Value Ref Range   Cholesterol 222 (H) 0 - 200 mg/dL   Triglycerides 171.0 (H) 0.0 - 149.0 mg/dL   HDL 61.60 >39.00 mg/dL   VLDL 34.2 0.0 - 40.0 mg/dL   LDL Cholesterol 126 (H) 0 - 99 mg/dL   Total CHOL/HDL Ratio 4    NonHDL 159.91      Office Visit from 08/28/2018 in [place] at [facility]  PHQ-2 Total Score  0      Assessment & Plan:   Problem List Items Addressed This Visit    Urge incontinence    Uses small pad.       Prediabetes    Diet controlled.       Obesity, Class I, BMI 30.0-34.9 (see actual BMI)    Encouraged healthy diet and lifestyle changes to affect sustainable weight loss.       MDD (major depressive disorder), recurrent episode, moderate (HCC)    Stable period on sertraline - continue.       Hypothyroidism    Chronic, stable on levothyroxine - continue.       HLD (hyperlipidemia)    Chronic, off statin. Levels somewhat elevated. Encouraged healthy diet to help control lipids.  The 10-year ASCVD risk score Mikey Bussing DC Jr., et al., 2013) is: 31.1%   Values used to calculate the score:     Age: 8 years  Sex: Female     Is Non-Hispanic African American: No     Diabetic: Yes     Tobacco smoker: No     Systolic Blood Pressure: 902 mmHg     Is BP treated: No     HDL Cholesterol: 61.6 mg/dL     Total Cholesterol: 222 mg/dL       Health maintenance examination - Primary    Preventative protocols reviewed and updated unless pt declined. Discussed healthy diet and lifestyle.       GERD (gastroesophageal reflux disease)    Continues prevacid  15mg  daily.       Colon adenocarcinoma - rectosigmoid T3N1 (1/15) s/p lap LAR 04/14/2012    Latest colonoscopy reassuring. Has passed 5 yr mark.       CKD (chronic kidney disease) stage 3, GFR 30-59 ml/min (HCC)    Reviewed with patient, encouraged good hydration status and limiting NSAID use.       BPV (benign positional vertigo)    Ongoing vertigo, not reproducible with dix hallpike. Present for over a year - will refer for vestibular rehab. Pt agrees with plan.       Relevant Orders   Ambulatory referral to Physical Therapy   Bilateral primary osteoarthritis of knee    Went to flexogenics clinic last year with good results. Ongoing pain, takes aleve at night.       Advanced care planning/counseling discussion    Advanced directive: Received, scanned. HCPOA are daughter [name] or SIL [name]. Does not want prolonged life support if terminal condition but does want to receive artifical nutrition and hydration. If advanced dementia, would grant discretion to [facility] [place] for organ donor. Wants to be cremated.           No orders of the defined types were placed in this encounter.  Orders Placed This Encounter  Procedures  . Ambulatory referral to Physical Therapy    Referral Priority:   Routine    Referral Type:   Physical Medicine    Referral Reason:   Specialty Services Required    Requested Specialty:   Physical Therapy    Number of Visits Requested:   1    Follow up plan: Return in about 1 year (around 08/28/2019) for annual exam, prior fasting for blood work, medicare wellness visit.  Ria Bush, MD

## 2018-08-29 DIAGNOSIS — N183 Chronic kidney disease, stage 3 unspecified: Secondary | ICD-10-CM | POA: Insufficient documentation

## 2018-08-29 DIAGNOSIS — M170 Bilateral primary osteoarthritis of knee: Secondary | ICD-10-CM | POA: Insufficient documentation

## 2018-08-29 NOTE — Assessment & Plan Note (Signed)
Advanced directive: Received, scanned. HCPOA are daughter [name] or SIL [name]. Does not want prolonged life support if terminal condition but does want to receive artifical nutrition and hydration. If advanced dementia, would grant discretion to [facility] [place] for organ donor. Wants to be cremated.

## 2018-08-29 NOTE — Assessment & Plan Note (Signed)
Stable period on sertraline - continue.  °

## 2018-08-29 NOTE — Assessment & Plan Note (Signed)
Latest colonoscopy reassuring. Has passed 5 yr mark.

## 2018-08-29 NOTE — Assessment & Plan Note (Signed)
Ongoing vertigo, not reproducible with dix hallpike. Present for over a year - will refer for vestibular rehab. Pt agrees with plan.

## 2018-08-29 NOTE — Assessment & Plan Note (Signed)
Reviewed with patient, encouraged good hydration status and limiting NSAID use.

## 2018-08-29 NOTE — Assessment & Plan Note (Signed)
Chronic, off statin. Levels somewhat elevated. Encouraged healthy diet to help control lipids.  The 10-year ASCVD risk score Mikey Bussing DC Brooke Bonito., et al., 2013) is: 31.1%   Values used to calculate the score:     Age: 78 years     Sex: Female     Is Non-Hispanic African American: No     Diabetic: Yes     Tobacco smoker: No     Systolic Blood Pressure: 431 mmHg     Is BP treated: No     HDL Cholesterol: 61.6 mg/dL     Total Cholesterol: 222 mg/dL

## 2018-08-29 NOTE — Assessment & Plan Note (Signed)
Encouraged healthy diet and lifestyle changes to affect sustainable weight loss.  

## 2018-08-29 NOTE — Assessment & Plan Note (Signed)
Went to flexogenics clinic last year with good results. Ongoing pain, takes aleve at night.

## 2018-08-29 NOTE — Assessment & Plan Note (Signed)
Diet controlled.  

## 2018-08-29 NOTE — Assessment & Plan Note (Signed)
Chronic, stable on levothyroxine - continue.  °

## 2018-08-29 NOTE — Assessment & Plan Note (Signed)
Continues prevacid 15mg  daily.

## 2018-08-29 NOTE — Assessment & Plan Note (Signed)
Uses small pad.

## 2018-09-05 ENCOUNTER — Other Ambulatory Visit: Payer: Self-pay | Admitting: Family Medicine

## 2018-09-07 ENCOUNTER — Ambulatory Visit: Payer: Medicare Other | Admitting: Rehabilitative and Restorative Service Providers"

## 2018-10-01 ENCOUNTER — Other Ambulatory Visit: Payer: Self-pay | Admitting: Family Medicine

## 2018-10-06 ENCOUNTER — Inpatient Hospital Stay: Payer: Medicare Other

## 2018-10-06 ENCOUNTER — Inpatient Hospital Stay: Payer: Medicare Other | Attending: Nurse Practitioner | Admitting: Nurse Practitioner

## 2019-02-08 ENCOUNTER — Other Ambulatory Visit: Payer: Self-pay | Admitting: Family Medicine

## 2019-02-08 DIAGNOSIS — Z1231 Encounter for screening mammogram for malignant neoplasm of breast: Secondary | ICD-10-CM

## 2019-02-21 ENCOUNTER — Ambulatory Visit: Payer: Medicare Other

## 2019-03-01 ENCOUNTER — Ambulatory Visit: Payer: Medicare PPO | Attending: Internal Medicine

## 2019-03-01 DIAGNOSIS — Z23 Encounter for immunization: Secondary | ICD-10-CM

## 2019-03-01 NOTE — Progress Notes (Signed)
   Covid-19 Vaccination Clinic  Name:  Lisa Osborne    MRN: [id number] DOB: 12-Apr-1940  03/01/2019  Lisa Osborne was observed post Covid-19 immunization for 15 minutes without incidence. She was provided with Vaccine Information Sheet and instruction to access the V-Safe system.   Lisa Osborne was instructed to call 911 with any severe reactions post vaccine: Marland Kitchen Difficulty breathing  . Swelling of your face and throat  . A fast heartbeat  . A bad rash all over your body  . Dizziness and weakness    Immunizations Administered    Name Date Dose VIS Date Route   Pfizer COVID-19 Vaccine 03/01/2019  1:31 PM 0.3 mL 01/05/2019 Intramuscular   Manufacturer: [place]   Lot: [id number]   [place]: [id number]

## 2019-03-13 ENCOUNTER — Ambulatory Visit
Admission: RE | Admit: 2019-03-13 | Discharge: 2019-03-13 | Disposition: A | Discharge status: Home / Self Care | Payer: Medicare PPO | Source: Ambulatory Visit | Attending: Family Medicine | Admitting: Family Medicine

## 2019-03-13 DIAGNOSIS — Z1231 Encounter for screening mammogram for malignant neoplasm of breast: Secondary | ICD-10-CM

## 2019-03-13 HISTORY — DX: Personal history of antineoplastic chemotherapy: Z92.21

## 2019-03-13 LAB — HM MAMMOGRAPHY

## 2019-03-14 ENCOUNTER — Encounter: Payer: Self-pay | Admitting: Family Medicine

## 2019-03-26 ENCOUNTER — Ambulatory Visit: Payer: Medicare PPO | Attending: Internal Medicine

## 2019-03-26 DIAGNOSIS — Z23 Encounter for immunization: Secondary | ICD-10-CM | POA: Insufficient documentation

## 2019-03-26 NOTE — Progress Notes (Signed)
   Covid-19 Vaccination Clinic  Name:  Lisa Osborne    MRN: [id number] DOB: 06-Sep-1940  03/26/2019  Lisa Osborne was observed post Covid-19 immunization for 15 minutes without incidence. She was provided with Vaccine Information Sheet and instruction to access the V-Safe system.   Lisa Osborne was instructed to call 911 with any severe reactions post vaccine: Marland Kitchen Difficulty breathing  . Swelling of your face and throat  . A fast heartbeat  . A bad rash all over your body  . Dizziness and weakness    Immunizations Administered    Name Date Dose VIS Date Route   Pfizer COVID-19 Vaccine 03/26/2019  4:11 PM 0.3 mL 01/05/2019 Intramuscular   Manufacturer: [place]   Lot: [id number]   [place]: [id number]

## 2019-07-12 ENCOUNTER — Other Ambulatory Visit: Payer: Self-pay | Admitting: Family Medicine

## 2019-08-28 ENCOUNTER — Telehealth: Payer: Self-pay

## 2019-08-28 ENCOUNTER — Ambulatory Visit: Payer: Medicare PPO

## 2019-08-28 ENCOUNTER — Other Ambulatory Visit: Payer: Self-pay

## 2019-08-28 NOTE — Telephone Encounter (Signed)
Called patient 3 times trying to complete her Medicare wellness visit. Patient never answered. Voicemail box is full and unable to leave a message.

## 2019-08-29 ENCOUNTER — Other Ambulatory Visit: Payer: Self-pay | Admitting: Family Medicine

## 2019-08-29 NOTE — Telephone Encounter (Signed)
E-scribed refill.  Pt has cpe on 09/04/19.  Plz schedule wellness with nurse and lab visits before the 09/04/19 visit with Dr. Darnell Level.

## 2019-09-04 ENCOUNTER — Ambulatory Visit (INDEPENDENT_AMBULATORY_CARE_PROVIDER_SITE_OTHER): Payer: Medicare PPO | Admitting: Family Medicine

## 2019-09-04 ENCOUNTER — Encounter: Payer: Self-pay | Admitting: Family Medicine

## 2019-09-04 ENCOUNTER — Other Ambulatory Visit: Payer: Self-pay

## 2019-09-04 VITALS — BP 138/84 | HR 71 | Temp 97.7°F | Ht 67.5 in | Wt 232.1 lb

## 2019-09-04 DIAGNOSIS — E785 Hyperlipidemia, unspecified: Secondary | ICD-10-CM | POA: Diagnosis not present

## 2019-09-04 DIAGNOSIS — C189 Malignant neoplasm of colon, unspecified: Secondary | ICD-10-CM

## 2019-09-04 DIAGNOSIS — R7303 Prediabetes: Secondary | ICD-10-CM | POA: Diagnosis not present

## 2019-09-04 DIAGNOSIS — K219 Gastro-esophageal reflux disease without esophagitis: Secondary | ICD-10-CM

## 2019-09-04 DIAGNOSIS — R2681 Unsteadiness on feet: Secondary | ICD-10-CM | POA: Insufficient documentation

## 2019-09-04 DIAGNOSIS — H8110 Benign paroxysmal vertigo, unspecified ear: Secondary | ICD-10-CM

## 2019-09-04 DIAGNOSIS — N3941 Urge incontinence: Secondary | ICD-10-CM

## 2019-09-04 DIAGNOSIS — F331 Major depressive disorder, recurrent, moderate: Secondary | ICD-10-CM | POA: Diagnosis not present

## 2019-09-04 DIAGNOSIS — R202 Paresthesia of skin: Secondary | ICD-10-CM | POA: Diagnosis not present

## 2019-09-04 DIAGNOSIS — Z0000 Encounter for general adult medical examination without abnormal findings: Secondary | ICD-10-CM

## 2019-09-04 DIAGNOSIS — N183 Chronic kidney disease, stage 3 unspecified: Secondary | ICD-10-CM | POA: Diagnosis not present

## 2019-09-04 DIAGNOSIS — H9192 Unspecified hearing loss, left ear: Secondary | ICD-10-CM

## 2019-09-04 DIAGNOSIS — E039 Hypothyroidism, unspecified: Secondary | ICD-10-CM | POA: Diagnosis not present

## 2019-09-04 LAB — COMPREHENSIVE METABOLIC PANEL
ALT: 11 U/L (ref 0–35)
AST: 19 U/L (ref 0–37)
Albumin: 4.5 g/dL (ref 3.5–5.2)
Alkaline Phosphatase: 61 U/L (ref 39–117)
BUN: 19 mg/dL (ref 6–23)
CO2: 28 mEq/L (ref 19–32)
Calcium: 9.3 mg/dL (ref 8.4–10.5)
Chloride: 102 mEq/L (ref 96–112)
Creatinine, Ser: 1.06 mg/dL (ref 0.40–1.20)
GFR: 50.18 mL/min — ABNORMAL LOW (ref >60.00)
Glucose, Bld: 112 mg/dL — ABNORMAL HIGH (ref 70–99)
Potassium: 4 mEq/L (ref 3.5–5.1)
Sodium: 139 mEq/L (ref 135–145)
Total Bilirubin: 0.6 mg/dL (ref 0.2–1.2)
Total Protein: 7.4 g/dL (ref 6.0–8.3)

## 2019-09-04 LAB — LIPID PANEL
Cholesterol: 224 mg/dL — ABNORMAL HIGH (ref 0–200)
HDL: 62.5 mg/dL (ref >39.00)
LDL Cholesterol: 130 mg/dL — ABNORMAL HIGH (ref 0–99)
NonHDL: 161.05
Total CHOL/HDL Ratio: 4
Triglycerides: 157 mg/dL — ABNORMAL HIGH (ref 0.0–149.0)
VLDL: 31.4 mg/dL (ref 0.0–40.0)

## 2019-09-04 LAB — CBC WITH DIFFERENTIAL/PLATELET
Basophils Absolute: 0 10*3/uL (ref 0.0–0.1)
Basophils Relative: 0.4 % (ref 0.0–3.0)
Eosinophils Absolute: 0 10*3/uL (ref 0.0–0.7)
Eosinophils Relative: 0.3 % (ref 0.0–5.0)
HCT: 37.2 % (ref 36.0–46.0)
Hemoglobin: 12.7 g/dL (ref 12.0–15.0)
Lymphocytes Relative: 37 % (ref 12.0–46.0)
Lymphs Abs: 1.6 10*3/uL (ref 0.7–4.0)
MCHC: 34 g/dL (ref 30.0–36.0)
MCV: 96.2 fl (ref 78.0–100.0)
Monocytes Absolute: 0.6 10*3/uL (ref 0.1–1.0)
Monocytes Relative: 14.8 % — ABNORMAL HIGH (ref 3.0–12.0)
Neutro Abs: 2.1 10*3/uL (ref 1.4–7.7)
Neutrophils Relative %: 47.5 % (ref 43.0–77.0)
Platelets: 165 10*3/uL (ref 150.0–400.0)
RBC: 3.86 Mil/uL — ABNORMAL LOW (ref 3.87–5.11)
RDW: 13 % (ref 11.5–15.5)
WBC: 4.3 10*3/uL (ref 4.0–10.5)

## 2019-09-04 LAB — HEMOGLOBIN A1C: Hgb A1c MFr Bld: 6.2 % (ref 4.6–6.5)

## 2019-09-04 LAB — TSH: TSH: 2.35 u[IU]/mL (ref 0.35–4.50)

## 2019-09-04 LAB — VITAMIN D 25 HYDROXY (VIT D DEFICIENCY, FRACTURES): VITD: 45.19 ng/mL (ref 30.00–100.00)

## 2019-09-04 LAB — VITAMIN B12: Vitamin B-12: 665 pg/mL (ref 211–911)

## 2019-09-04 MED ORDER — LEVOTHYROXINE SODIUM 25 MCG PO TABS: 25.0000 ug | ORAL_TABLET | Freq: Every day | ORAL | 3 refills | Status: DC

## 2019-09-04 MED ORDER — SERTRALINE HCL 100 MG PO TABS: 100.0000 mg | ORAL_TABLET | Freq: Every day | ORAL | 3 refills | Status: DC

## 2019-09-04 MED ORDER — LANSOPRAZOLE 15 MG PO CPDR: DELAYED_RELEASE_CAPSULE | ORAL | 3 refills | Status: DC

## 2019-09-04 NOTE — Assessment & Plan Note (Addendum)
Chronic, stable on levothyroxine.  Update TSH. 

## 2019-09-04 NOTE — Assessment & Plan Note (Signed)
Stable period on lansoprazole 15mg  daily.

## 2019-09-04 NOTE — Assessment & Plan Note (Signed)
Chronic, not on statin. Update labs. Consider statin.  The 10-year ASCVD risk score Mikey Bussing DC Brooke Bonito., et al., 2013) is: 39.3%   Values used to calculate the score:     Age: 79 years     Sex: Female     Is Non-Hispanic African American: No     Diabetic: Yes     Tobacco smoker: No     Systolic Blood Pressure: 446 mmHg     Is BP treated: No     HDL Cholesterol: 61.6 mg/dL     Total Cholesterol: 222 mg/dL

## 2019-09-04 NOTE — Assessment & Plan Note (Signed)
Encouraged healthy diet and lifestyle choices to affect sustainable weight loss. She continues regular swimming routine.

## 2019-09-04 NOTE — Assessment & Plan Note (Signed)
Ongoing for >2 yrs now, led to fall with patient concern for concussion. Will refer for vestibular rehab.

## 2019-09-04 NOTE — Assessment & Plan Note (Signed)
Preventative protocols reviewed and updated unless pt declined. Discussed healthy diet and lifestyle.  

## 2019-09-04 NOTE — Assessment & Plan Note (Signed)
Mild, stable with small pad use.

## 2019-09-04 NOTE — Assessment & Plan Note (Signed)
Newly noted at bilateral toes - check labs (B12, TSH, CBC) today.

## 2019-09-04 NOTE — Patient Instructions (Addendum)
Labwork today  We will refer you to physical therapy for vertigo (vestibular) therapy. If ongoing falls despite this, let us know for fall prevention/balance training physical therapy program. Increase water intake as well.  We will refer you back to audiology You are doing well today. Return as needed or in 1 year for next physical.   Health Maintenance After Age 78 After age 8, you are at a higher risk for certain long-term diseases and infections as well as injuries from falls. Falls are a major cause of broken bones and head injuries in people who are older than age 23. Getting regular preventive care can help to keep you healthy and well. Preventive care includes getting regular testing and making lifestyle changes as recommended by your health care provider. Talk with your health care provider about:  Which screenings and tests you should have. A screening is a test that checks for a disease when you have no symptoms.  A diet and exercise plan that is right for you. What should I know about screenings and tests to prevent falls? Screening and testing are the best ways to find a health problem early. Early diagnosis and treatment give you the best chance of managing medical conditions that are common after age 52. Certain conditions and lifestyle choices may make you more likely to have a fall. Your health care provider may recommend:  Regular vision checks. Poor vision and conditions such as cataracts can make you more likely to have a fall. If you wear glasses, make sure to get your prescription updated if your vision changes.  Medicine review. Work with your health care provider to regularly review all of the medicines you are taking, including over-the-counter medicines. Ask your health care provider about any side effects that may make you more likely to have a fall. Tell your health care provider if any medicines that you take make you feel dizzy or sleepy.  Osteoporosis screening.  Osteoporosis is a condition that causes the bones to get weaker. This can make the bones weak and cause them to break more easily.  Blood pressure screening. Blood pressure changes and medicines to control blood pressure can make you feel dizzy.  Strength and balance checks. Your health care provider may recommend certain tests to check your strength and balance while standing, walking, or changing positions.  Foot health exam. Foot pain and numbness, as well as not wearing proper footwear, can make you more likely to have a fall.  Depression screening. You may be more likely to have a fall if you have a fear of falling, feel emotionally low, or feel unable to do activities that you used to do.  Alcohol use screening. Using too much alcohol can affect your balance and may make you more likely to have a fall. What actions can I take to lower my risk of falls? General instructions  Talk with your health care provider about your risks for falling. Tell your health care provider if: ? You fall. Be sure to tell your health care provider about all falls, even ones that seem minor. ? You feel dizzy, sleepy, or off-balance.  Take over-the-counter and prescription medicines only as told by your health care provider. These include any supplements.  Eat a healthy diet and maintain a healthy weight. A healthy diet includes low-fat dairy products, low-fat (lean) meats, and fiber from whole grains, beans, and lots of fruits and vegetables. Home safety  Remove any tripping hazards, such as rugs, cords, and clutter.  Install safety equipment such as grab bars in bathrooms and safety rails on stairs.  Keep rooms and walkways well-lit. Activity   Follow a regular exercise program to stay fit. This will help you maintain your balance. Ask your health care provider what types of exercise are appropriate for you.  If you need a cane or walker, use it as recommended by your health care provider.  Wear  supportive shoes that have nonskid soles. Lifestyle  Do not drink alcohol if your health care provider tells you not to drink.  If you drink alcohol, limit how much you have: ? 0-1 drink a day for women. ? 0-2 drinks a day for men.  Be aware of how much alcohol is in your drink. In the [place] one drink equals one typical bottle of beer (12 oz), one-half glass of wine (5 oz), or one shot of hard liquor (1 oz).  Do not use any products that contain nicotine or tobacco, such as cigarettes and e-cigarettes. If you need help quitting, ask your health care provider. Summary  Having a healthy lifestyle and getting preventive care can help to protect your health and wellness after age 17.  Screening and testing are the best way to find a health problem early and help you avoid having a fall. Early diagnosis and treatment give you the best chance for managing medical conditions that are more common for people who are older than age 68.  Falls are a major cause of broken bones and head injuries in people who are older than age 41. Take precautions to prevent a fall at home.  Work with your health care provider to learn what changes you can make to improve your health and wellness and to prevent falls. This information is not intended to replace advice given to you by your health care provider. Make sure you discuss any questions you have with your health care provider. Document Revised: 05/04/2018 Document Reviewed: 11/24/2016 Elsevier Patient Education  2020 [employer].

## 2019-09-04 NOTE — Progress Notes (Signed)
This visit was conducted in person.  BP 138/84 (BP Location: Right Arm, Patient Position: Sitting, Cuff Size: Large)   Pulse 71   Temp 97.7 F (36.5 C) (Temporal)   Ht 5' 7.5" (1.715 m)   Wt 232 lb 1 oz (105.3 kg)   SpO2 96%   BMI 35.81 kg/m    CC: AMW/CPE Subjective:    Patient ID: Lisa Osborne, female    DOB: [birth year], 79 y.o.   MRN: [id number]  HPI: Lisa Osborne is a 79 y.o. female presenting on 09/04/2019 for Medicare Wellness (C/o vertigo isssues in the last few weeks causing her to fall. )   Did not see health advisor this year.    Hearing Screening   125Hz  250Hz  500Hz  1000Hz  2000Hz  3000Hz  4000Hz  6000Hz  8000Hz   Right ear:   40 0 40  40    Left ear:           Comments: Has bilateral hearing aids.  States she does not wear them.  Says she has total hearing loss in left ear.    Visual Acuity Screening   Right eye Left eye Both eyes  Without correction: 20/40 20/30 20/30   With correction:     Comments: Next eye exam, 09/2019. Lost her hearing aide - agrees to audiology referral (remotely seen).  fmhx unilateral hearing loss. Gradual L hearing loss about 20 yrs ago.    Office Visit from 09/04/2019 in [place] at [facility] Total Score 0      Fall Risk  09/04/2019 08/28/2018 08/21/2018 08/19/2017 06/17/2016  Falls in the past year? 1 0 0 Yes Yes  Comment - - - fell downstairs and caused fractures to 2 toes pt tripped and fell down stairs causing injury to right foot; no medical treatment  Number falls in past yr: 1 - - 1 1  Injury with Fall? 1 - - Yes Yes  Comment Concussion - - - -  Risk for fall due to : - - Medication side effect - -  Follow up - - Falls evaluation completed;Falls prevention discussed - -  Fall with head injury attributed to worsening vertigo while working in garden lost balance and fell, hit her head on paving stone x2 - happened 3 wks ago. Did not seek medical care - daughter is a Marine scientist and watched over her. She  feels she had a concussion - affected balance, trouble reading - symptoms overall better now. Swimming with grandson. Not using ambulatory assistive device. Has not undergone PT but may be interested. Finds she needs to "hang on to something" which significantly helps balance issue. Noted paresthesias of bilateral great toes. Vertigo reproducible with sudden head turns, bending over, or standing up too quickly. Known BPPV, never completed vestibular rehab (referred 05-15-2018).   Recent death in family - nephew shot in hunting accident. Increased stress has led to some overeating.    Preventative: Colon cancer s/p lap LAR 05-14-2012 - clinical remission followed by Dr Benay Spice ONC. COLONOSCOPY Date: 2013-05-14 2 polyps, mild diverticulosis, rpt 3 yrs [name]) COLONOSCOPY May 14, 2016 5 TAs, fair prep, patent anastomosis, rpt 6-12 mo ([place]) COLONOSCOPY 01/2017 diverticulosis, healthy anastomosis, rpt 5 yrs (Armbruster)  Well woman - s/p total hysterectomy 1985, ovaries removed as well.  Mammogram 02/2019 Birads1 DEXA - done~2005in [place] and normal per patient.  Fluyearly Tetanus - May 14, 2008 Fortine 02/2019, 2019/05/15  Pneumovax 05/15/2006, prevnar May 14, 2013 Shingrix-discussed.upcoming appt tomorrow at pharmacy.  Advanced directive:Received, scanned 05-14-17. HCPOA are daughter  Lisa Osborne or SIL [name]. Does not want prolonged life support if terminal condition but does want to receive artifical nutrition and hydration. If advanced dementia, would grant discretion to [facility]. [place] for organ donor. Wants to be cremated. Seat belt use discussed Sunscreen use discussed. Wants mole on back evaluated - growing.  Ex smoker quit 1969 Alcohol -1 a week  Dentist - Q6 mo Eye exam - yearly Bowels - no constipation or diarrhea  Bladder - some dribbling throughout the day - uses small pad.   Caffeine: 1 cup coffee/day Lives alone. 1 daughter Lisa Osborne), 3 stillborns Occupation: retired, was Chief Technology Officer. Activity:  walking and swimming Diet: good water, fruits/vegetables      Relevant past medical, surgical, family and social history reviewed and updated as indicated. Interim medical history since our last visit reviewed. Allergies and medications reviewed and updated. Outpatient Medications Prior to Visit  Medication Sig Dispense Refill  . APPLE CIDER VINEGAR PO Take 1 capsule by mouth daily.    . Calcium Citrate-Vitamin D (CALCIUM CITRATE + D PO) Take 1 tablet by mouth daily.     . Coenzyme Q10 (COQ10) 200 MG CAPS Take 1 capsule by mouth daily.     . Ferrous Sulfate (IRON) 325 (65 Fe) MG TABS Take by mouth daily.    Marland Kitchen loratadine (CLARITIN) 10 MG tablet Take 10 mg by mouth daily as needed for allergies. Reported on 03/17/2015    . Melatonin 5 MG TABS Take 5 mg by mouth at bedtime.     . Omega-3 Fatty Acids (FISH OIL) 1000 MG CAPS Take 1 capsule by mouth daily.    . vitamin B-12 (CYANOCOBALAMIN) 1000 MCG tablet Take 1,000 mcg by mouth daily.    . lansoprazole (PREVACID) 15 MG capsule TAKE 1 CAPSULE BY MOUTH EVERY DAY 90 capsule 0  . levothyroxine (SYNTHROID) 25 MCG tablet TAKE 1 TABLET BY MOUTH EVERY DAY 90 tablet 0  . sertraline (ZOLOFT) 100 MG tablet TAKE 1 TABLET BY MOUTH EVERY DAY 90 tablet 0   No facility-administered medications prior to visit.     Per HPI unless specifically indicated in ROS section below Review of Systems  Constitutional: Negative for activity change, appetite change, chills, fatigue, fever and unexpected weight change.  HENT: Negative for hearing loss.   Eyes: Negative for visual disturbance.  Respiratory: Negative for cough, chest tightness, shortness of breath and wheezing.   Cardiovascular: Negative for chest pain, palpitations and leg swelling.  Gastrointestinal: Negative for abdominal distention, abdominal pain, blood in stool, constipation, diarrhea, nausea and vomiting.  Genitourinary: Negative for difficulty urinating and hematuria.  Musculoskeletal: Negative  for arthralgias, myalgias and neck pain.  Skin: Negative for rash.  Neurological: Positive for dizziness. Negative for seizures, syncope and headaches.  Hematological: Negative for adenopathy. Bruises/bleeds easily.  Psychiatric/Behavioral: Negative for dysphoric mood. The patient is not nervous/anxious.    Objective:  BP 138/84 (BP Location: Right Arm, Patient Position: Sitting, Cuff Size: Large)   Pulse 71   Temp 97.7 F (36.5 C) (Temporal)   Ht 5' 7.5" (1.715 m)   Wt 232 lb 1 oz (105.3 kg)   SpO2 96%   BMI 35.81 kg/m   Wt Readings from Last 3 Encounters:  09/04/19 232 lb 1 oz (105.3 kg)  08/28/18 232 lb 9 oz (105.5 kg)  08/21/18 233 lb (105.7 kg)      Physical Exam Vitals and nursing note reviewed.  Constitutional:      General: She is not in acute distress.  Appearance: Normal appearance. She is well-developed. She is obese. She is not ill-appearing.  HENT:     Head: Normocephalic and atraumatic.     Right Ear: Hearing, tympanic membrane, ear canal and external ear normal.     Left Ear: Hearing, tympanic membrane, ear canal and external ear normal.  Eyes:     General: No scleral icterus.    Extraocular Movements: Extraocular movements intact.     Conjunctiva/sclera: Conjunctivae normal.     Pupils: Pupils are equal, round, and reactive to light.  Neck:     Thyroid: No thyroid mass, thyromegaly or thyroid tenderness.     Vascular: No carotid bruit.  Cardiovascular:     Rate and Rhythm: Normal rate and regular rhythm.     Pulses: Normal pulses.          Radial pulses are 2+ on the right side and 2+ on the left side.     Heart sounds: Normal heart sounds. No murmur heard.   Pulmonary:     Effort: Pulmonary effort is normal. No respiratory distress.     Breath sounds: Normal breath sounds. No wheezing, rhonchi or rales.  Abdominal:     General: Abdomen is flat. Bowel sounds are normal. There is no distension.     Palpations: Abdomen is soft. There is no mass.      Tenderness: There is no abdominal tenderness. There is no guarding or rebound.     Hernia: No hernia is present.  Musculoskeletal:        General: Normal range of motion.     Cervical back: Normal range of motion and neck supple.     Right lower leg: No edema.     Left lower leg: No edema.  Lymphadenopathy:     Cervical: No cervical adenopathy.  Skin:    General: Skin is warm and dry.     Findings: Lesion present. No rash.     Comments: Benign appearing SKs to back  Neurological:     General: No focal deficit present.     Mental Status: She is alert and oriented to person, place, and time.     Comments:  Recall 3/3 Calculation 5/5 DLROW 5/5 strength BLE Sensation intact  Neg romberg, no pronator drift  Psychiatric:        Mood and Affect: Mood normal.        Behavior: Behavior normal.        Thought Content: Thought content normal.        Judgment: Judgment normal.       Results for orders placed or performed in visit on 03/14/19  HM MAMMOGRAPHY  Result Value Ref Range   HM Mammogram 0-4 Bi-Rad 0-4 Bi-Rad, Self Reported Normal   Depression screen [facility] 2/9 09/04/2019 08/28/2018 08/21/2018 08/19/2017 06/17/2016  Decreased Interest 0 0 0 0 0  Down, Depressed, Hopeless 0 0 0 0 0  PHQ - 2 Score 0 0 0 0 0  Altered sleeping 2 1 0 0 -  Tired, decreased energy 1 1 0 0 -  Change in appetite 2 1 0 0 -  Feeling bad or failure about yourself  0 0 0 0 -  Trouble concentrating 0 0 0 0 -  Moving slowly or fidgety/restless 0 0 0 0 -  Suicidal thoughts 0 0 0 0 -  PHQ-9 Score 5 3 0 0 -  Difficult doing work/chores - - - Not difficult at all -    GAD 7 : Generalized Anxiety  Score 09/04/2019 08/28/2018  Nervous, Anxious, on Edge 1 0  Control/stop worrying 1 1  Worry too much - different things 1 1  Trouble relaxing 0 0  Restless 0 0  Easily annoyed or irritable 0 0  Afraid - awful might happen 0 0  Total GAD 7 Score 3 2   Assessment & Plan:  This visit occurred during the SARS-CoV-2  public health emergency.  Safety protocols were in place, including screening questions prior to the visit, additional usage of staff PPE, and extensive cleaning of exam room while observing appropriate contact time as indicated for disinfecting solutions.   Problem List Items Addressed This Visit    Urge incontinence    Mild, stable with small pad use.       Unsteadiness on feet    Anticipate ongoing vertigo contributing to unsteadiness and risk of falls. Discussed PT options of balance training/fall prevention vs vestibular rehab for peripheral vertigo. Will start with vestibular rehab, but may eventually refer to outpatient PT for fall prevention.       Relevant Orders   Ambulatory referral to Physical Therapy   Severe obesity (BMI 35.0-39.9) with comorbidity ([place])    Encouraged healthy diet and lifestyle choices to affect sustainable weight loss. She continues regular swimming routine.       Prediabetes    Update A1c.       Relevant Orders   Hemoglobin A1c   Paresthesia of foot, bilateral    Newly noted at bilateral toes - check labs (B12, TSH, CBC) today.       Relevant Orders   CBC with Differential/Platelet   Vitamin B12   Medicare annual wellness visit, subsequent - Primary    I have personally reviewed the Medicare Annual Wellness questionnaire and have noted 1. The patient's medical and social history 2. Their use of alcohol, tobacco or illicit drugs 3. Their current medications and supplements 4. The patient's functional ability including ADL's, fall risks, home safety risks and hearing or visual impairment. Cognitive function has been assessed and addressed as indicated.  5. Diet and physical activity 6. Evidence for depression or mood disorders The patients weight, height, BMI have been recorded in the chart. I have made referrals, counseling and provided education to the patient based on review of the above and I have provided the pt with a written personalized  care plan for preventive services. Provider list updated.. See scanned questionairre as needed for further documentation. Reviewed preventative protocols and updated unless pt declined.       MDD (major depressive disorder), recurrent episode, moderate (HCC)    Stable period on sertraline 100mg  daily - will continue in setting of nephew's recent death.       Relevant Medications   sertraline (ZOLOFT) 100 MG tablet   Hypothyroidism    Chronic, stable on levothyroxine. Update TSH.       Relevant Medications   levothyroxine (SYNTHROID) 25 MCG tablet   Other Relevant Orders   TSH   HLD (hyperlipidemia)    Chronic, not on statin. Update labs. Consider statin.  The 10-year ASCVD risk score Mikey Bussing DC Brooke Bonito., et al., 2013) is: 39.3%   Values used to calculate the score:     Age: 44 years     Sex: Female     Is Non-Hispanic African American: No     Diabetic: Yes     Tobacco smoker: No     Systolic Blood Pressure: 703 mmHg     Is BP treated:  No     HDL Cholesterol: 61.6 mg/dL     Total Cholesterol: 222 mg/dL       Relevant Orders   Lipid panel   Comprehensive metabolic panel   Health maintenance examination    Preventative protocols reviewed and updated unless pt declined. Discussed healthy diet and lifestyle.       GERD (gastroesophageal reflux disease)    Stable period on lansoprazole 15mg  daily.       Relevant Medications   lansoprazole (PREVACID) 15 MG capsule   Colon adenocarcinoma - rectosigmoid T3N1 (1/15) s/p lap LAR 04/14/2012    Continues yearly onc f/u      CKD (chronic kidney disease) stage 3, GFR 30-59 ml/min    Update labs.       Relevant Orders   Comprehensive metabolic panel   CBC with Differential/Platelet   VITAMIN D 25 Hydroxy (Vit-D Deficiency, Fractures)   BPV (benign positional vertigo)    Ongoing for >2 yrs now, led to fall with patient concern for concussion. Will refer for vestibular rehab.       Relevant Orders   Ambulatory referral to  Physical Therapy   Acquired deafness of left ear    Longstanding, gradual onset. No recent audiology eval - and lost R hearing aide - new referral placed.      Relevant Orders   Ambulatory referral to Audiology       Meds ordered this encounter  Medications  . sertraline (ZOLOFT) 100 MG tablet    Sig: Take 1 tablet (100 mg total) by mouth daily.    Dispense:  90 tablet    Refill:  3  . levothyroxine (SYNTHROID) 25 MCG tablet    Sig: Take 1 tablet (25 mcg total) by mouth daily.    Dispense:  90 tablet    Refill:  3  . lansoprazole (PREVACID) 15 MG capsule    Sig: TAKE 1 CAPSULE BY MOUTH EVERY DAY    Dispense:  90 capsule    Refill:  3   Orders Placed This Encounter  Procedures  . Lipid panel  . Comprehensive metabolic panel  . TSH  . Hemoglobin A1c  . CBC with Differential/Platelet  . Vitamin B12  . VITAMIN D 25 Hydroxy (Vit-D Deficiency, Fractures)  . Ambulatory referral to Audiology    Referral Priority:   Routine    Referral Type:   Audiology Exam    Referral Reason:   Specialty Services Required    Number of Visits Requested:   1  . Ambulatory referral to Physical Therapy    Referral Priority:   Routine    Referral Type:   Physical Medicine    Referral Reason:   Specialty Services Required    Requested Specialty:   Physical Therapy    Number of Visits Requested:   1    Patient instructions: Labwork today  We will refer you to physical therapy for vertigo (vestibular) therapy. If ongoing falls despite this, let us know for fall prevention/balance training physical therapy program. Increase water intake as well.  We will refer you back to audiology You are doing well today. Return as needed or in 1 year for next physical.   Follow up plan: Return in about 1 year (around 09/03/2020) for annual exam, prior fasting for blood work, medicare wellness visit.  Ria Bush, MD

## 2019-09-04 NOTE — Assessment & Plan Note (Signed)
Update labs.  

## 2019-09-04 NOTE — Assessment & Plan Note (Signed)
Longstanding, gradual onset. No recent audiology eval - and lost R hearing aide - new referral placed.

## 2019-09-04 NOTE — Assessment & Plan Note (Signed)
Stable period on sertraline 100mg  daily - will continue in setting of nephew's recent death.

## 2019-09-04 NOTE — Assessment & Plan Note (Signed)

## 2019-09-04 NOTE — Assessment & Plan Note (Signed)
Continues yearly onc f/u

## 2019-09-04 NOTE — Assessment & Plan Note (Signed)
Update A1c ?

## 2019-09-04 NOTE — Assessment & Plan Note (Addendum)
Anticipate ongoing vertigo contributing to unsteadiness and risk of falls. Discussed PT options of balance training/fall prevention vs vestibular rehab for peripheral vertigo. Will start with vestibular rehab, but may eventually refer to outpatient PT for fall prevention.

## 2019-09-07 ENCOUNTER — Other Ambulatory Visit: Payer: Self-pay

## 2019-09-07 ENCOUNTER — Ambulatory Visit: Payer: Medicare PPO | Attending: Audiologist | Admitting: Audiologist

## 2019-09-07 DIAGNOSIS — IMO0001 Reserved for inherently not codable concepts without codable children: Secondary | ICD-10-CM

## 2019-09-07 DIAGNOSIS — H903 Sensorineural hearing loss, bilateral: Secondary | ICD-10-CM | POA: Diagnosis not present

## 2019-09-07 NOTE — Procedures (Signed)
[facility]patient [facility] and [facility] [place], [place]  [id number] [phone]  AUDIOLOGICAL  EVALUATION  NAME: Lisa Osborne   DOB:   1940-11-08      MRN: [id number]                                                                            DATE: 09/07/2019     REFERENT: Ria Bush, MD STATUS: Outpatient DIAGNOSIS: Bilateral asymmetric sensorineural hearing loss    History: Lisa Osborne was seen for an audiological evaluation.  Lisa Osborne is receiving a hearing evaluation due to concerns for hearing loss . Lisa Osborne has a history of hearing loss in both ears. Over a decade ago she had a sudden idiopathic hearing loss in the left ear. She said this also happened to her father and uncle. She has difficulty hearing in background noise and hearing soft talkers. This difficulty began gradually as the hearing in her right ear has deteriorated. She used to wear a hearing aid in the right ear but lost it about five years ago. She is interested in wearing amplification again. No pain or pressure reported in either ear today. She says her right ear sometimes hurts because of her allergy to plastic, and her reading glasses cause discomfort on her pinna. She pointed out the area of irritation, and the skin is red. Tinnitus present in both ears but it last only a few seconds at a time.  Lisa Osborne has a history of BPPPV. She becomes dizzy when turning her head to the right. Her primary care doctor has recommended vestibular PT for fall prevention.   Medical history positive for chronic kidney disease stage 3 which is a risk factor for hearing loss. No other relevant case history reported.   Evaluation:   Otoscopy showed a clear view of the tympanic membranes, bilaterally  Tympanometry results were consistent with normal middle ear function with present ipsi reflex screener at 1k Hz, bilaterally    Audiometric testing was completed using conventional audiometry with insert  transducer. Speech Recognition Thresholds were consistent with pure tone averages in the right ear. SRT is worse than expected for the left ear, Lisa Osborne could not understand any speech presented to the left ear. Word Recognition performed only in the right ear, recognition was excellent at conversation and an elevated level. Pure tone thresholds show sensorineural hearing loss in both ears. Left ear showed moderate to severe hearing loss, right ear mild to moderate hearing loss. Test results are consistent with significant asymmetric hearing loss with the left ear having no useable speech understanding.   Results:  The test results were reviewed with Lisa Osborne. Test results are consistent with significant asymmetric hearing loss with the left ear having no useable speech understanding. She is a good candidate for a hearing aid in the right ear only. She said she is ready to start wearing an aid in the right ear again. She was given a copy of her hearing test and told it can be used for the next 6 months to program a hearing aid, after which she would need the test repeated to check for changes. She also needs a hearing test every year due to CKD. Annual testing would be  easiest at the location from which he purchases a hearing aid. She reported understanding.   Recommendations: 1. Amplification is necessary for the right ear. A hearing aid can be purchased from a variety of locations. See provided list for locations in the [place] area.  2. Referral to ENT Physician is recommended before the start of vestibular physical therapy to rule out any medical pathology connecting the asymmetric hearing loss and vertigo. This was not discussed with Lisa Osborne and is at the discretion of her primary care provider.    Alfonse Alpers  Audiologist, Au.D., CCC-A  09/07/2019  10:37 AM  Cc: Ria Bush, MD

## 2019-09-17 ENCOUNTER — Other Ambulatory Visit: Payer: Self-pay | Admitting: Family Medicine

## 2019-09-17 MED ORDER — ATORVASTATIN CALCIUM 10 MG PO TABS
10.0000 mg | ORAL_TABLET | Freq: Every day | ORAL | 11 refills | Status: DC
Start: 2019-09-17 — End: 2020-09-01

## 2019-10-15 DIAGNOSIS — H25033 Anterior subcapsular polar age-related cataract, bilateral: Secondary | ICD-10-CM | POA: Diagnosis not present

## 2019-10-15 DIAGNOSIS — H21233 Degeneration of iris (pigmentary), bilateral: Secondary | ICD-10-CM | POA: Diagnosis not present

## 2019-10-15 DIAGNOSIS — H524 Presbyopia: Secondary | ICD-10-CM | POA: Diagnosis not present

## 2019-10-15 DIAGNOSIS — E119 Type 2 diabetes mellitus without complications: Secondary | ICD-10-CM | POA: Diagnosis not present

## 2019-10-15 DIAGNOSIS — H2513 Age-related nuclear cataract, bilateral: Secondary | ICD-10-CM | POA: Diagnosis not present

## 2019-10-15 DIAGNOSIS — H25013 Cortical age-related cataract, bilateral: Secondary | ICD-10-CM | POA: Diagnosis not present

## 2019-10-15 DIAGNOSIS — H5203 Hypermetropia, bilateral: Secondary | ICD-10-CM | POA: Diagnosis not present

## 2019-10-15 LAB — HM DIABETES EYE EXAM

## 2019-10-17 ENCOUNTER — Encounter: Payer: Self-pay | Admitting: Family Medicine

## 2020-02-01 ENCOUNTER — Encounter: Payer: Self-pay | Admitting: Gastroenterology

## 2020-02-04 ENCOUNTER — Other Ambulatory Visit: Payer: Self-pay | Admitting: Family Medicine

## 2020-02-04 DIAGNOSIS — Z1231 Encounter for screening mammogram for malignant neoplasm of breast: Secondary | ICD-10-CM

## 2020-03-13 ENCOUNTER — Other Ambulatory Visit: Payer: Self-pay | Admitting: Family Medicine

## 2020-03-13 ENCOUNTER — Ambulatory Visit
Admission: RE | Admit: 2020-03-13 | Discharge: 2020-03-13 | Disposition: A | Discharge status: Home / Self Care | Payer: Medicare PPO | Source: Ambulatory Visit | Attending: Family Medicine | Admitting: Family Medicine

## 2020-03-13 ENCOUNTER — Other Ambulatory Visit: Payer: Self-pay

## 2020-03-13 DIAGNOSIS — Z1231 Encounter for screening mammogram for malignant neoplasm of breast: Secondary | ICD-10-CM | POA: Insufficient documentation

## 2020-03-13 DIAGNOSIS — N649 Disorder of breast, unspecified: Secondary | ICD-10-CM

## 2020-03-21 ENCOUNTER — Ambulatory Visit
Admission: RE | Admit: 2020-03-21 | Discharge: 2020-03-21 | Disposition: A | Discharge status: Home / Self Care | Payer: Medicare PPO | Source: Ambulatory Visit | Attending: Family Medicine | Admitting: Family Medicine

## 2020-03-21 ENCOUNTER — Other Ambulatory Visit: Payer: Self-pay

## 2020-03-21 DIAGNOSIS — N6459 Other signs and symptoms in breast: Secondary | ICD-10-CM | POA: Diagnosis not present

## 2020-03-21 DIAGNOSIS — N649 Disorder of breast, unspecified: Secondary | ICD-10-CM | POA: Diagnosis not present

## 2020-08-15 ENCOUNTER — Encounter: Payer: Self-pay | Admitting: Primary Care

## 2020-08-15 ENCOUNTER — Other Ambulatory Visit: Payer: Self-pay

## 2020-08-15 ENCOUNTER — Ambulatory Visit: Payer: Medicare PPO | Admitting: Primary Care

## 2020-08-15 VITALS — BP 140/82 | HR 80 | Temp 97.2°F | Ht 67.5 in | Wt 229.0 lb

## 2020-08-15 DIAGNOSIS — H8110 Benign paroxysmal vertigo, unspecified ear: Secondary | ICD-10-CM

## 2020-08-15 DIAGNOSIS — R300 Dysuria: Secondary | ICD-10-CM

## 2020-08-15 LAB — POCT URINALYSIS DIP (CLINITEK)
Bilirubin, UA: NEGATIVE
Blood, UA: NEGATIVE
Glucose, UA: NEGATIVE mg/dL
Nitrite, UA: NEGATIVE
Spec Grav, UA: 1.025 (ref 1.010–1.025)
Urobilinogen, UA: 0.2 E.U./dL
pH, UA: 5.5 (ref 5.0–8.0)

## 2020-08-15 NOTE — Progress Notes (Signed)
Subjective:    Patient ID: Lisa Osborne, female    DOB: 1940-04-28, 80 y.o.   MRN: [id number]  HPI  Lisa Osborne is a very pleasant 80 y.o. female patient of Dr. Danise Mina with a history of CKD, prediabetes, vertigo, hypothyroidism, who presents today to discuss dizziness and dysuria.  She also reports vaginal itching, urinary frequency. Symptoms began about one week ago. She's been drinking cranberry juice, increasing water intake.   She denies vaginal discharge, hematuria, fevers, abdominal pain.   She will notice dizziness with positional changes, walking, swimming. She lost balance while sitting on a noodle in the pool a few days ago, lost equilibrium, was submerged underwater, struggled to come up to the surface. She was able to surface "eventually". These symptoms of dizziness feel just like her prior vertigo symptoms. She's tried some maneuvers that her PCP provided her, these have helped with symptoms.    Review of Systems  Gastrointestinal:  Negative for abdominal pain.  Genitourinary:  Positive for dysuria and frequency. Negative for flank pain, hematuria, urgency, vaginal bleeding and vaginal discharge.  Neurological:  Positive for dizziness.        Past Medical History:  Diagnosis Date   Abnormal EKG    incomplete RBBB   Acquired deafness of left ear    Acute cholecystitis    Allergy    Anemia    Anxiety    Arthritis    left side lower back pain   Brown recluse spider bite 03/2018   Colon adenocarcinoma - rectosigmoid T3N1 (1/15) s/p lap LAR 04/14/2012   Status post laparoscopic lower anterior resection   Dehydration 02/2015   Depression    severe in past   Diabetes mellitus type 2, controlled ([place]) 2006   diet controlled   GERD (gastroesophageal reflux disease)    bad if off PPI   History of chicken pox    History of iron deficiency anemia    HLD (hyperlipidemia) 08/03/2015   Hypothyroidism    mild   Laceration of fifth toe, right  08/26/2017   Personal history of chemotherapy    Rosacea    Seasonal allergies    Unilateral deafnesses    left ear deaf, wears hearing aid right side   Urine incontinence    mild, wears pad    Social History   Socioeconomic History   Marital status: Single    Spouse name: Not on file   Number of children: 1   Years of education: Not on file   Highest education level: Not on file  Occupational History   Occupation: Retired Product manager: RETIRED  Tobacco Use   Smoking status: Former    Types: Cigarettes    Quit date: 04/06/1967    Years since quitting: 53.3   Smokeless tobacco: Never  Vaping Use   Vaping Use: Never used  Substance and Sexual Activity   Alcohol use: Yes    Alcohol/week: 1.0 standard drink    Types: 1 Shots of liquor per week    Comment: 1 per week   Drug use: No   Sexual activity: Not Currently  Other Topics Concern   Not on file  Social History Narrative   Caffeine: 1 cup coffee/day   Lives alone.  1 daughter [name]), 3 stillborns   Occupation: retired, was Chief Technology Officer.   Activity: no regular activity but does park far away, has treadmill at home   Diet: good fruits/vegetables, more fruit/vegetable smoothies  good amt water  Social Determinants of Health   Financial Resource Strain: Not on file  Food Insecurity: Not on file  Transportation Needs: Not on file  Physical Activity: Not on file  Stress: Not on file  Social Connections: Not on file  Intimate Partner Violence: Not on file    Past Surgical History:  Procedure Laterality Date   ABDOMINOPLASTY  1996   CHOLECYSTECTOMY N/A 03/20/2015   Procedure: LAPAROSCOPIC CHOLECYSTECTOMY;  Surgeon: Florene Glen, MD;  Location: [facility] ORS;  Service: General;  Laterality: N/A;   COLONOSCOPY  08/2006   normal   COLONOSCOPY  03/2013   2 polyps, mild diverticulosis, rpt 3 yrs [name])   COLONOSCOPY  03/2016   5 TAs, fair prep, patent anastomosis, rpt 6-12 mo ([place])   COLONOSCOPY   01/2017   diverticulosis, healthy anastomosis, rpt 5 yrs ([facility])   FACELIFT  05/2011   INCONTINENCE SURGERY  1996   LAPAROSCOPIC LOW ANTERIOR RESECTION N/A 04/14/2012   colon cancer at rectosigmoid junction s/p rigid proctoscopy splenic flexure mobilization lysis of adhesion;  Surgeon: Adin Hector, MD;    MOUTH SURGERY     crown repair   POLYPECTOMY     TOTAL VAGINAL HYSTERECTOMY  1985   cancer cells on cervix    Family History  Problem Relation Age of Onset   Breast cancer Mother 41       breast cancer then multiple myeloma   Alcohol abuse Father    Heart disease Father        heart failure   Mental illness Brother        2 brothers severe depression, suicide x2   Diabetes Maternal Aunt    Diabetes Maternal Uncle    Diabetes Paternal Uncle    Diabetes Paternal Grandfather    Coronary artery disease Neg Hx    Stroke Neg Hx    Colon cancer Neg Hx    Esophageal cancer Neg Hx    Rectal cancer Neg Hx    Stomach cancer Neg Hx    Liver disease Neg Hx    Pancreatic cancer Neg Hx    Prostate cancer Neg Hx     Allergies  Allergen Reactions   Latex     Mild reaction/itching   Nickel     Slight itch   Shrimp [Shellfish Allergy]     Slight allergy - itching    Current Outpatient Medications on File Prior to Visit  Medication Sig Dispense Refill   APPLE CIDER VINEGAR PO Take 1 capsule by mouth daily.     atorvastatin (LIPITOR) 10 MG tablet Take 1 tablet (10 mg total) by mouth daily. 30 tablet 11   Calcium Citrate-Vitamin D (CALCIUM CITRATE + D PO) Take 1 tablet by mouth daily.      Coenzyme Q10 (COQ10) 200 MG CAPS Take 1 capsule by mouth daily.      Ferrous Sulfate (IRON) 325 (65 Fe) MG TABS Take by mouth daily.     lansoprazole (PREVACID) 15 MG capsule TAKE 1 CAPSULE BY MOUTH EVERY DAY 90 capsule 3   levothyroxine (SYNTHROID) 25 MCG tablet Take 1 tablet (25 mcg total) by mouth daily. 90 tablet 3   loratadine (CLARITIN) 10 MG tablet Take 10 mg by mouth daily as  needed for allergies. Reported on 03/17/2015     Melatonin 5 MG TABS Take 5 mg by mouth at bedtime.      Omega-3 Fatty Acids (FISH OIL) 1000 MG CAPS Take 1 capsule by mouth daily.  sertraline (ZOLOFT) 100 MG tablet Take 1 tablet (100 mg total) by mouth daily. 90 tablet 3   vitamin B-12 (CYANOCOBALAMIN) 1000 MCG tablet Take 1,000 mcg by mouth daily.     No current facility-administered medications on file prior to visit.    BP 140/82   Pulse 80   Temp (!) 97.2 F (36.2 C) (Temporal)   Ht 5' 7.5" (1.715 m)   Wt 229 lb (103.9 kg)   SpO2 95%   BMI 35.34 kg/m  Objective:   Physical Exam Eyes:     Extraocular Movements: Extraocular movements intact.  Cardiovascular:     Rate and Rhythm: Normal rate and regular rhythm.  Pulmonary:     Effort: Pulmonary effort is normal.     Breath sounds: Normal breath sounds.  Musculoskeletal:     Cervical back: Neck supple.  Skin:    General: Skin is warm and dry.  Neurological:     Mental Status: She is oriented to person, place, and time.     Cranial Nerves: No cranial nerve deficit.     Motor: No weakness.     Coordination: Coordination normal.          Assessment & Plan:      This visit occurred during the SARS-CoV-2 public health emergency.  Safety protocols were in place, including screening questions prior to the visit, additional usage of staff PPE, and extensive cleaning of exam room while observing appropriate contact time as indicated for disinfecting solutions.

## 2020-08-15 NOTE — Progress Notes (Deleted)
po

## 2020-08-15 NOTE — Patient Instructions (Signed)
We will be in touch once we receive your urine culture results.   Continue the maneuvers that Dr. Darnell Level provided you for vertigo.  You can try Meclizine medication for vertigo. Take 12.5 mg (1/2 tablet) up to three times daily as needed. This may cause drowsiness.   It was a pleasure meeting you!

## 2020-08-15 NOTE — Assessment & Plan Note (Signed)
Chronic with what sounds to be an acute flare. She has noticed improvement temporarily with Epley's maneuvers.  Discussed small dose of Meclizine OTC 12.5 mg, drowsiness precautions provided.   Orthostatic vitals negative. Neuro exam today negative.

## 2020-08-15 NOTE — Assessment & Plan Note (Signed)
Acute x 1 week with vaginal itching.  UA today with trace leuks, otherwise negative. Culture sent. Await results for treatment.   Continue to push water intake. Limit cranberry juice to 4 ounces daily.   Consider vaginal moisturizer for itching, could be secondary to vaginal atrophy. No obvious signs of candidiasis.

## 2020-08-17 LAB — URINE CULTURE
MICRO NUMBER:: 12152418
Result:: NO GROWTH
SPECIMEN QUALITY:: ADEQUATE

## 2020-08-18 ENCOUNTER — Encounter: Payer: Self-pay | Admitting: Gastroenterology

## 2020-08-31 ENCOUNTER — Other Ambulatory Visit: Payer: Self-pay | Admitting: Family Medicine

## 2020-09-01 NOTE — Telephone Encounter (Signed)
Left voice message to call the office  

## 2020-09-01 NOTE — Telephone Encounter (Signed)
E-scribed refill.  Plz schedule wellness, lab and cpe visits.  

## 2020-09-02 ENCOUNTER — Encounter: Payer: Self-pay | Admitting: Gastroenterology

## 2020-09-15 ENCOUNTER — Other Ambulatory Visit: Payer: Self-pay

## 2020-09-15 ENCOUNTER — Ambulatory Visit (AMBULATORY_SURGERY_CENTER): Payer: Medicare PPO | Admitting: *Deleted

## 2020-09-15 VITALS — Ht 69.0 in | Wt 230.0 lb

## 2020-09-15 DIAGNOSIS — Z86010 Personal history of colonic polyps: Secondary | ICD-10-CM

## 2020-09-15 DIAGNOSIS — C189 Malignant neoplasm of colon, unspecified: Secondary | ICD-10-CM

## 2020-09-15 MED ORDER — NA SULFATE-K SULFATE-MG SULF 17.5-3.13-1.6 GM/177ML PO SOLN: 1.0000 | Freq: Once | ORAL | 0 refills | Status: AC

## 2020-09-15 NOTE — Progress Notes (Signed)
Pt verified name, DOB, address and insurance during PV today.  Pt mailed instruction packet of Emmi video, copy of consent form to read and not return, and instructions.  PV completed over the phone.  Pt encouraged to call with questions or issues.  My Chart instructions to pt as well    No egg or soy allergy known to patient  No issues with past sedation with any surgeries or procedures Patient denies ever being told they had issues or difficulty with intubation  No FH of Malignant Hyperthermia No diet pills per patient No home 02 use per patient  No blood thinners per patient  Pt denies issues with constipation - 2 day prep per RAD signed 01-2020 [place]  No A fib or A flutter  EMMI video to pt or via [place] 19 guidelines implemented in [place] today with Pt and RN   Pt is fully vaccinated  for Covid    Due to the COVID-19 pandemic we are asking patients to follow certain guidelines.  Pt aware of COVID protocols and [facility] guidelines

## 2020-09-25 HISTORY — PX: COLONOSCOPY: SHX174

## 2020-09-26 ENCOUNTER — Other Ambulatory Visit: Payer: Self-pay

## 2020-09-26 ENCOUNTER — Ambulatory Visit (AMBULATORY_SURGERY_CENTER): Payer: Medicare PPO | Admitting: Gastroenterology

## 2020-09-26 ENCOUNTER — Encounter: Payer: Self-pay | Admitting: Gastroenterology

## 2020-09-26 VITALS — BP 131/80 | HR 54 | Temp 95.0°F | Resp 12 | Ht 67.5 in | Wt 230.0 lb

## 2020-09-26 DIAGNOSIS — D124 Benign neoplasm of descending colon: Secondary | ICD-10-CM

## 2020-09-26 DIAGNOSIS — D125 Benign neoplasm of sigmoid colon: Secondary | ICD-10-CM | POA: Diagnosis not present

## 2020-09-26 DIAGNOSIS — D122 Benign neoplasm of ascending colon: Secondary | ICD-10-CM

## 2020-09-26 DIAGNOSIS — K648 Other hemorrhoids: Secondary | ICD-10-CM | POA: Diagnosis not present

## 2020-09-26 DIAGNOSIS — Z85038 Personal history of other malignant neoplasm of large intestine: Secondary | ICD-10-CM | POA: Diagnosis not present

## 2020-09-26 DIAGNOSIS — Z86010 Personal history of colonic polyps: Secondary | ICD-10-CM | POA: Diagnosis not present

## 2020-09-26 DIAGNOSIS — D123 Benign neoplasm of transverse colon: Secondary | ICD-10-CM

## 2020-09-26 DIAGNOSIS — D120 Benign neoplasm of cecum: Secondary | ICD-10-CM

## 2020-09-26 DIAGNOSIS — C189 Malignant neoplasm of colon, unspecified: Secondary | ICD-10-CM

## 2020-09-26 MED ORDER — SODIUM CHLORIDE 0.9 % IV SOLN: 500.0000 mL | Freq: Once | INTRAVENOUS | Status: DC

## 2020-09-26 NOTE — Op Note (Signed)
[place] Patient Name: Bren Fazio Procedure Date: 09/26/2020 2:25 PM MRN: [id number] Endoscopist: Remo Lipps P. Havery Moros , MD Age: 80 Referring MD:  Date of Birth: [birth year] Gender: Female Account #: [id number] Procedure:                Colonoscopy Indications:              High risk colon cancer surveillance: Personal                            history of colon cancer - 2014, 2018 - 5 polyps Medicines:                Monitored Anesthesia Care Procedure:                Pre-Anesthesia Assessment:                           - Prior to the procedure, a History and Physical                            was performed, and patient medications and                            allergies were reviewed. The patient's tolerance of                            previous anesthesia was also reviewed. The risks                            and benefits of the procedure and the sedation                            options and risks were discussed with the patient.                            All questions were answered, and informed consent                            was obtained. Prior Anticoagulants: The patient has                            taken no previous anticoagulant or antiplatelet                            agents. ASA Grade Assessment: II - A patient with                            mild systemic disease. After reviewing the risks                            and benefits, the patient was deemed in                            satisfactory condition to undergo the procedure.  After obtaining informed consent, the colonoscope                            was passed under direct vision. Throughout the                            procedure, the patient's blood pressure, pulse, and                            oxygen saturations were monitored continuously. The                            Olympus [id number]-H190DL [id number]) Colonoscope was                            introduced  through the anus and advanced to the the                            cecum, identified by appendiceal orifice and                            ileocecal valve. The colonoscopy was performed                            without difficulty. The patient tolerated the                            procedure well. The quality of the bowel                            preparation was adequate. The ileocecal valve,                            appendiceal orifice, and rectum were photographed. Scope In: 2:39:07 PM Scope Out: 3:01:14 PM Scope Withdrawal Time: 0 hours 15 minutes 42 seconds  Total Procedure Duration: 0 hours 22 minutes 7 seconds  Findings:                 The perianal and digital rectal examinations were                            normal.                           There was evidence of a prior end-to-end                            colo-colonic anastomosis in the distal sigmoid                            colon. This was patent and was characterized by                            healthy appearing mucosa.  A 3 mm polyp was found in the cecum. The polyp was                            sessile. The polyp was removed with a cold snare.                            Resection and retrieval were complete.                           Two sessile polyps were found in the ascending                            colon. The polyps were 3 to 5 mm in size. These                            polyps were removed with a cold snare. Resection                            and retrieval were complete.                           Five sessile polyps were found in the transverse                            colon. The polyps were 3 to 4 mm in size. These                            polyps were removed with a cold snare. Resection                            and retrieval were complete.                           A 4 mm polyp was found in the descending colon. The                            polyp was sessile.  The polyp was removed with a                            cold snare. Resection and retrieval were complete.                           A 4 mm polyp was found in the sigmoid colon. The                            polyp was sessile. The polyp was removed with a                            cold snare. Resection and retrieval were complete.                           Internal hemorrhoids were  found during retroflexion.                           The exam was otherwise without abnormality. Complications:            No immediate complications. Estimated blood loss:                            Minimal. Estimated Blood Loss:     Estimated blood loss was minimal. Impression:               - Patent end-to-end colo-colonic anastomosis,                            characterized by healthy appearing mucosa.                           - One 3 mm polyp in the cecum, removed with a cold                            snare. Resected and retrieved.                           - Two 3 to 5 mm polyps in the ascending colon,                            removed with a cold snare. Resected and retrieved.                           - Five 3 to 4 mm polyps in the transverse colon,                            removed with a cold snare. Resected and retrieved.                           - One 4 mm polyp in the descending colon, removed                            with a cold snare. Resected and retrieved.                           - One 4 mm polyp in the sigmoid colon, removed with                            a cold snare. Resected and retrieved.                           - Internal hemorrhoids.                           - The examination was otherwise normal. Recommendation:           - Patient has a contact number available for  emergencies. The signs and symptoms of potential                            delayed complications were discussed with the                            patient. Return to normal  activities tomorrow.                            Written discharge instructions were provided to the                            patient.                           - Resume previous diet.                           - Continue present medications.                           - Await pathology results. Remo Lipps P. Dickie Cloe, MD 09/26/2020 3:09:14 PM This report has been signed electronically.

## 2020-09-26 NOTE — Progress Notes (Signed)
Called to room to assist during endoscopic procedure.  Patient ID and intended procedure confirmed with present staff. Received instructions for my participation in the procedure from the performing physician.  

## 2020-09-26 NOTE — Progress Notes (Signed)
[facility] History and Physical   Primary Care Physician:  Ria Bush, MD   Reason for Procedure:   History of colon cancer / polyps  Plan:    colonoscopy     HPI: Lisa Osborne is a 80 y.o. female with a history of CRC in 2014, had 5 adenomas removed in 2018. Here for surveillance colonoscopy. She wishes to have one more exam at her age prior to stopping further surveillance, given her history. Denies problems with her bowels. Otherwise feels well.    Past Medical History:  Diagnosis Date   Abnormal EKG    incomplete RBBB   Acquired deafness of left ear    Acute cholecystitis    Allergy    Anemia    Anxiety    Arthritis    left side lower back pain   Brown recluse spider bite 03/2018   Colon adenocarcinoma - rectosigmoid T3N1 (1/15) s/p lap LAR 04/14/2012   Status post laparoscopic lower anterior resection   Dehydration 02/2015   Depression    severe in past   Diabetes mellitus type 2, controlled ([place]) 2006   diet controlled   GERD (gastroesophageal reflux disease)    bad if off PPI   History of chicken pox    History of iron deficiency anemia    HLD (hyperlipidemia) 08/03/2015   Hypothyroidism    mild   Laceration of fifth toe, right 08/26/2017   Neuromuscular disorder ([facility])    neuropathy both great toes   Personal history of chemotherapy    Rosacea    Seasonal allergies    Unilateral deafnesses    left ear deaf, wears hearing aid right side   Urine incontinence    mild, wears pad    Past Surgical History:  Procedure Laterality Date   ABDOMINOPLASTY  1996   CHOLECYSTECTOMY N/A 03/20/2015   Procedure: LAPAROSCOPIC CHOLECYSTECTOMY;  Surgeon: Florene Glen, MD;  Location: [facility] ORS;  Service: General;  Laterality: N/A;   COLONOSCOPY  08/2006   normal   COLONOSCOPY  03/2013   2 polyps, mild diverticulosis, rpt 3 yrs [name])   COLONOSCOPY  03/2016   5 TAs, fair prep, patent anastomosis, rpt 6-12 mo ([place])   COLONOSCOPY   01/2017   diverticulosis, healthy anastomosis, rpt 5 yrs ([place])   FACELIFT  05/2011   INCONTINENCE SURGERY  1996   LAPAROSCOPIC LOW ANTERIOR RESECTION N/A 04/14/2012   colon cancer at rectosigmoid junction s/p rigid proctoscopy splenic flexure mobilization lysis of adhesion;  Surgeon: Adin Hector, MD;    MOUTH SURGERY     crown repair   POLYPECTOMY     TOTAL VAGINAL HYSTERECTOMY  1985   cancer cells on cervix    Prior to Admission medications   Medication Sig Start Date End Date Taking? Authorizing Provider  APPLE CIDER VINEGAR PO Take 1 capsule by mouth daily.   Yes [provider]  atorvastatin (LIPITOR) 10 MG tablet TAKE 1 TABLET BY MOUTH EVERY DAY 09/01/20  Yes Ria Bush, MD  Calcium Citrate-Vitamin D (CALCIUM CITRATE + D PO) Take 1 tablet by mouth daily.    Yes [provider]  Coenzyme Q10 (COQ10) 200 MG CAPS Take 1 capsule by mouth daily.    Yes [provider]  CRANBERRY PO Take by mouth.   Yes [provider]  Ferrous Sulfate (IRON) 325 (65 Fe) MG TABS Take by mouth daily.   Yes [provider]  lansoprazole (PREVACID) 15 MG capsule TAKE 1 CAPSULE BY  MOUTH EVERY DAY 09/04/19  Yes Ria Bush, MD  levothyroxine (SYNTHROID) 25 MCG tablet Take 1 tablet (25 mcg total) by mouth daily. 09/04/19  Yes Ria Bush, MD  Melatonin 5 MG TABS Take 5 mg by mouth at bedtime.    Yes [provider]  Omega-3 Fatty Acids (FISH OIL) 1000 MG CAPS Take 1 capsule by mouth daily.   Yes [provider]  sertraline (ZOLOFT) 100 MG tablet Take 1 tablet (100 mg total) by mouth daily. 09/04/19  Yes Ria Bush, MD  vitamin B-12 (CYANOCOBALAMIN) 1000 MCG tablet Take 1,000 mcg by mouth daily.   Yes [provider]  loratadine (CLARITIN) 10 MG tablet Take 10 mg by mouth daily as needed for allergies. Reported on 03/17/2015    [provider]    Current Outpatient Medications  Medication Sig Dispense  Refill   APPLE CIDER VINEGAR PO Take 1 capsule by mouth daily.     atorvastatin (LIPITOR) 10 MG tablet TAKE 1 TABLET BY MOUTH EVERY DAY 30 tablet 2   Calcium Citrate-Vitamin D (CALCIUM CITRATE + D PO) Take 1 tablet by mouth daily.      Coenzyme Q10 (COQ10) 200 MG CAPS Take 1 capsule by mouth daily.      CRANBERRY PO Take by mouth.     Ferrous Sulfate (IRON) 325 (65 Fe) MG TABS Take by mouth daily.     lansoprazole (PREVACID) 15 MG capsule TAKE 1 CAPSULE BY MOUTH EVERY DAY 90 capsule 3   levothyroxine (SYNTHROID) 25 MCG tablet Take 1 tablet (25 mcg total) by mouth daily. 90 tablet 3   Melatonin 5 MG TABS Take 5 mg by mouth at bedtime.      Omega-3 Fatty Acids (FISH OIL) 1000 MG CAPS Take 1 capsule by mouth daily.     sertraline (ZOLOFT) 100 MG tablet Take 1 tablet (100 mg total) by mouth daily. 90 tablet 3   vitamin B-12 (CYANOCOBALAMIN) 1000 MCG tablet Take 1,000 mcg by mouth daily.     loratadine (CLARITIN) 10 MG tablet Take 10 mg by mouth daily as needed for allergies. Reported on 03/17/2015     Current Facility-Administered Medications  Medication Dose Route Frequency Provider Last Rate Last Admin   0.9 %  sodium chloride infusion  500 mL Intravenous Once Yetta Flock, MD        Allergies as of 09/26/2020 - Review Complete 09/26/2020  Allergen Reaction Noted   Latex  11/29/2010   Nickel  11/26/2010   Shrimp [shellfish allergy]  11/26/2010    Family History  Problem Relation Age of Onset   Breast cancer Mother 40       breast cancer then multiple myeloma   Alcohol abuse Father    Heart disease Father        heart failure   Mental illness Brother        2 brothers severe depression, suicide x2   Diabetes Maternal Aunt    Diabetes Maternal Uncle    Diabetes Paternal Uncle    Diabetes Paternal Grandfather    Coronary artery disease Neg Hx    Stroke Neg Hx    Colon cancer Neg Hx    Esophageal cancer Neg Hx    Rectal cancer Neg Hx    Stomach cancer Neg Hx    Liver  disease Neg Hx    Pancreatic cancer Neg Hx    Prostate cancer Neg Hx    Colon polyps Neg Hx     Social History  Socioeconomic History   Marital status: Single    Spouse name: Not on file   Number of children: 1   Years of education: Not on file   Highest education level: Not on file  Occupational History   Occupation: Retired Product manager: RETIRED  Tobacco Use   Smoking status: Former    Types: Cigarettes    Quit date: 04/06/1967    Years since quitting: 53.5   Smokeless tobacco: Never  Vaping Use   Vaping Use: Never used  Substance and Sexual Activity   Alcohol use: Yes    Alcohol/week: 1.0 standard drink    Types: 1 Shots of liquor per week    Comment: 1 per week   Drug use: No   Sexual activity: Not Currently  Other Topics Concern   Not on file  Social History Narrative   Caffeine: 1 cup coffee/day   Lives alone.  1 daughter [name]), 3 stillborns   Occupation: retired, was Chief Technology Officer.   Activity: no regular activity but does park far away, has treadmill at home   Diet: good fruits/vegetables, more fruit/vegetable smoothies  good amt water   Social Determinants of Health   Financial Resource Strain: Not on file  Food Insecurity: Not on file  Transportation Needs: Not on file  Physical Activity: Not on file  Stress: Not on file  Social Connections: Not on file  Intimate Partner Violence: Not on file    Review of Systems:  All other review of systems negative except as mentioned in the HPI.  Physical Exam: Vital signs BP 138/79   Pulse 65   Temp (!) 95 F (35 C) (Temporal)   Ht 5' 7.5" (1.715 m)   Wt 230 lb (104.3 kg)   SpO2 93%   BMI 35.49 kg/m   General:   Alert,  Well-developed, well-nourished, pleasant and cooperative in NAD Lungs:  Clear throughout to auscultation.   Heart:  Regular rate and rhythm;  Abdomen:  Soft, nontender and nondistended.  Neuro/Psych:  Alert and cooperative. Normal mood and affect. A and O x 3  Jolly Mango, MD [facility]

## 2020-09-26 NOTE — Patient Instructions (Signed)
10 polyps removed- await pathology  Continue your normal medications  Please read over handouts about polyps and hemorrhoids   YOU HAD AN ENDOSCOPIC PROCEDURE TODAY AT [facility]:   Refer to the procedure report that was given to you for any specific questions about what was found during the examination.  If the procedure report does not answer your questions, please call your gastroenterologist to clarify.  If you requested that your care partner not be given the details of your procedure findings, then the procedure report has been included in a sealed envelope for you to review at your convenience later.  YOU SHOULD EXPECT: Some feelings of bloating in the abdomen. Passage of more gas than usual.  Walking can help get rid of the air that was put into your GI tract during the procedure and reduce the bloating. If you had a lower endoscopy (such as a colonoscopy or flexible sigmoidoscopy) you may notice spotting of blood in your stool or on the toilet paper. If you underwent a bowel prep for your procedure, you may not have a normal bowel movement for a few days.  Please Note:  You might notice some irritation and congestion in your nose or some drainage.  This is from the oxygen used during your procedure.  There is no need for concern and it should clear up in a day or so.  SYMPTOMS TO REPORT IMMEDIATELY:  Following lower endoscopy (colonoscopy or flexible sigmoidoscopy):  Excessive amounts of blood in the stool  Significant tenderness or worsening of abdominal pains  Swelling of the abdomen that is new, acute  Fever of 100F or higher  For urgent or emergent issues, a gastroenterologist can be reached at any hour by calling [phone]. Do not use MyChart messaging for urgent concerns.    DIET:  We do recommend a small meal at first, but then you may proceed to your regular diet.  Drink plenty of fluids but you should avoid alcoholic beverages for 24  hours.  ACTIVITY:  You should plan to take it easy for the rest of today and you should NOT DRIVE or use heavy machinery until tomorrow (because of the sedation medicines used during the test).    FOLLOW UP: Our staff will call the number listed on your records 48-72 hours following your procedure to check on you and address any questions or concerns that you may have regarding the information given to you following your procedure. If we do not reach you, we will leave a message.  We will attempt to reach you two times.  During this call, we will ask if you have developed any symptoms of COVID 19. If you develop any symptoms (ie: fever, flu-like symptoms, shortness of breath, cough etc.) before then, please call [phone].  If you test positive for Covid 19 in the 2 weeks post procedure, please call and report this information to [place].    If any biopsies were taken you will be contacted by phone or by letter within the next 1-3 weeks.  Please call us at [phone] if you have not heard about the biopsies in 3 weeks.    SIGNATURES/CONFIDENTIALITY: You and/or your care partner have signed paperwork which will be entered into your electronic medical record.  These signatures attest to the fact that that the information above on your After Visit Summary has been reviewed and is understood.  Full responsibility of the confidentiality of this discharge information lies with you and/or your  care-partner.  

## 2020-09-26 NOTE — Progress Notes (Signed)
Sedate, gd SR, tolerated procedure well, VSS, report to RN 

## 2020-09-26 NOTE — Progress Notes (Signed)
VS completed by [place].   Pt's states no medical or surgical changes since previsit or office visit.  

## 2020-09-30 ENCOUNTER — Telehealth: Payer: Self-pay

## 2020-09-30 NOTE — Telephone Encounter (Signed)
Attempted f/u call back. No answer, left VM. 

## 2020-09-30 NOTE — Telephone Encounter (Signed)
Second attempt follow up call to pt, no answer.  

## 2020-10-29 ENCOUNTER — Other Ambulatory Visit: Payer: Self-pay | Admitting: Family Medicine

## 2020-11-06 DIAGNOSIS — E119 Type 2 diabetes mellitus without complications: Secondary | ICD-10-CM | POA: Diagnosis not present

## 2020-11-06 DIAGNOSIS — H25033 Anterior subcapsular polar age-related cataract, bilateral: Secondary | ICD-10-CM | POA: Diagnosis not present

## 2020-11-06 DIAGNOSIS — H5203 Hypermetropia, bilateral: Secondary | ICD-10-CM | POA: Diagnosis not present

## 2020-11-06 DIAGNOSIS — H2513 Age-related nuclear cataract, bilateral: Secondary | ICD-10-CM | POA: Diagnosis not present

## 2020-11-06 DIAGNOSIS — H25013 Cortical age-related cataract, bilateral: Secondary | ICD-10-CM | POA: Diagnosis not present

## 2020-11-06 DIAGNOSIS — H21233 Degeneration of iris (pigmentary), bilateral: Secondary | ICD-10-CM | POA: Diagnosis not present

## 2020-11-06 DIAGNOSIS — H524 Presbyopia: Secondary | ICD-10-CM | POA: Diagnosis not present

## 2020-11-06 LAB — HM DIABETES EYE EXAM

## 2020-11-10 ENCOUNTER — Encounter: Payer: Self-pay | Admitting: Family Medicine

## 2020-11-26 ENCOUNTER — Other Ambulatory Visit: Payer: Self-pay | Admitting: Family Medicine

## 2020-12-20 ENCOUNTER — Other Ambulatory Visit: Payer: Self-pay | Admitting: Family Medicine

## 2020-12-23 ENCOUNTER — Other Ambulatory Visit: Payer: Self-pay

## 2020-12-23 ENCOUNTER — Encounter: Payer: Self-pay | Admitting: Family Medicine

## 2020-12-23 ENCOUNTER — Ambulatory Visit (INDEPENDENT_AMBULATORY_CARE_PROVIDER_SITE_OTHER)
Admission: RE | Admit: 2020-12-23 | Discharge: 2020-12-23 | Disposition: A | Discharge status: Home / Self Care | Payer: Medicare PPO | Source: Ambulatory Visit | Attending: Family Medicine | Admitting: Family Medicine

## 2020-12-23 ENCOUNTER — Ambulatory Visit (INDEPENDENT_AMBULATORY_CARE_PROVIDER_SITE_OTHER): Payer: Medicare PPO | Admitting: Family Medicine

## 2020-12-23 VITALS — BP 140/70 | HR 70 | Temp 97.5°F | Ht 68.0 in | Wt 232.1 lb

## 2020-12-23 DIAGNOSIS — I499 Cardiac arrhythmia, unspecified: Secondary | ICD-10-CM

## 2020-12-23 DIAGNOSIS — E039 Hypothyroidism, unspecified: Secondary | ICD-10-CM | POA: Diagnosis not present

## 2020-12-23 DIAGNOSIS — R0609 Other forms of dyspnea: Secondary | ICD-10-CM

## 2020-12-23 DIAGNOSIS — E785 Hyperlipidemia, unspecified: Secondary | ICD-10-CM | POA: Diagnosis not present

## 2020-12-23 DIAGNOSIS — N183 Chronic kidney disease, stage 3 unspecified: Secondary | ICD-10-CM

## 2020-12-23 DIAGNOSIS — R7303 Prediabetes: Secondary | ICD-10-CM

## 2020-12-23 DIAGNOSIS — Z0001 Encounter for general adult medical examination with abnormal findings: Secondary | ICD-10-CM

## 2020-12-23 DIAGNOSIS — K219 Gastro-esophageal reflux disease without esophagitis: Secondary | ICD-10-CM

## 2020-12-23 DIAGNOSIS — C189 Malignant neoplasm of colon, unspecified: Secondary | ICD-10-CM

## 2020-12-23 DIAGNOSIS — F331 Major depressive disorder, recurrent, moderate: Secondary | ICD-10-CM

## 2020-12-23 DIAGNOSIS — R0600 Dyspnea, unspecified: Secondary | ICD-10-CM | POA: Diagnosis not present

## 2020-12-23 DIAGNOSIS — N3941 Urge incontinence: Secondary | ICD-10-CM

## 2020-12-23 DIAGNOSIS — H9192 Unspecified hearing loss, left ear: Secondary | ICD-10-CM

## 2020-12-23 DIAGNOSIS — Z1159 Encounter for screening for other viral diseases: Secondary | ICD-10-CM | POA: Diagnosis not present

## 2020-12-23 DIAGNOSIS — Z0000 Encounter for general adult medical examination without abnormal findings: Secondary | ICD-10-CM

## 2020-12-23 LAB — POC URINALSYSI DIPSTICK (AUTOMATED)
Blood, UA: NEGATIVE
Glucose, UA: NEGATIVE
Nitrite, UA: NEGATIVE
Protein, UA: POSITIVE — AB
Spec Grav, UA: 1.025 (ref 1.010–1.025)
Urobilinogen, UA: 0.2 E.U./dL
pH, UA: 5.5 (ref 5.0–8.0)

## 2020-12-23 NOTE — Patient Instructions (Addendum)
Labs today, urinalysis today EKG and chest xray today  We will be in touch with results.  If interested, check with pharmacy about new 2 shot shingles series (shingrix).  Price out [employer] 25mg  daily for bladder. Update me with effect or if unaffordable.  Return in 6 month for follow up visit, sooner if needed.   Health Maintenance After Age 78 After age 24, you are at a higher risk for certain long-term diseases and infections as well as injuries from falls. Falls are a major cause of broken bones and head injuries in people who are older than age 40. Getting regular preventive care can help to keep you healthy and well. Preventive care includes getting regular testing and making lifestyle changes as recommended by your health care provider. Talk with your health care provider about: Which screenings and tests you should have. A screening is a test that checks for a disease when you have no symptoms. A diet and exercise plan that is right for you. What should I know about screenings and tests to prevent falls? Screening and testing are the best ways to find a health problem early. Early diagnosis and treatment give you the best chance of managing medical conditions that are common after age 47. Certain conditions and lifestyle choices may make you more likely to have a fall. Your health care provider may recommend: Regular vision checks. Poor vision and conditions such as cataracts can make you more likely to have a fall. If you wear glasses, make sure to get your prescription updated if your vision changes. Medicine review. Work with your health care provider to regularly review all of the medicines you are taking, including over-the-counter medicines. Ask your health care provider about any side effects that may make you more likely to have a fall. Tell your health care provider if any medicines that you take make you feel dizzy or sleepy. Strength and balance checks. Your health care provider  may recommend certain tests to check your strength and balance while standing, walking, or changing positions. Foot health exam. Foot pain and numbness, as well as not wearing proper footwear, can make you more likely to have a fall. Screenings, including: Osteoporosis screening. Osteoporosis is a condition that causes the bones to get weaker and break more easily. Blood pressure screening. Blood pressure changes and medicines to control blood pressure can make you feel dizzy. Depression screening. You may be more likely to have a fall if you have a fear of falling, feel depressed, or feel unable to do activities that you used to do. Alcohol use screening. Using too much alcohol can affect your balance and may make you more likely to have a fall. Follow these instructions at home: Lifestyle Do not drink alcohol if: Your health care provider tells you not to drink. If you drink alcohol: Limit how much you have to: 0-1 drink a day for women. 0-2 drinks a day for men. Know how much alcohol is in your drink. In the [place] one drink equals one 12 oz bottle of beer (355 mL), one 5 oz glass of wine (148 mL), or one 1 oz glass of hard liquor (44 mL). Do not use any products that contain nicotine or tobacco. These products include cigarettes, chewing tobacco, and vaping devices, such as e-cigarettes. If you need help quitting, ask your health care provider. Activity  Follow a regular exercise program to stay fit. This will help you maintain your balance. Ask your health care provider what types  of exercise are appropriate for you. If you need a cane or walker, use it as recommended by your health care provider. Wear supportive shoes that have nonskid soles. Safety  Remove any tripping hazards, such as rugs, cords, and clutter. Install safety equipment such as grab bars in bathrooms and safety rails on stairs. Keep rooms and walkways well-lit. General instructions Talk with your health care  provider about your risks for falling. Tell your health care provider if: You fall. Be sure to tell your health care provider about all falls, even ones that seem minor. You feel dizzy, tiredness (fatigue), or off-balance. Take over-the-counter and prescription medicines only as told by your health care provider. These include supplements. Eat a healthy diet and maintain a healthy weight. A healthy diet includes low-fat dairy products, low-fat (lean) meats, and fiber from whole grains, beans, and lots of fruits and vegetables. Stay current with your vaccines. Schedule regular health, dental, and eye exams. Summary Having a healthy lifestyle and getting preventive care can help to protect your health and wellness after age 38. Screening and testing are the best way to find a health problem early and help you avoid having a fall. Early diagnosis and treatment give you the best chance for managing medical conditions that are more common for people who are older than age 54. Falls are a major cause of broken bones and head injuries in people who are older than age 47. Take precautions to prevent a fall at home. Work with your health care provider to learn what changes you can make to improve your health and wellness and to prevent falls. This information is not intended to replace advice given to you by your health care provider. Make sure you discuss any questions you have with your health care provider. Document Revised: 06/02/2020 Document Reviewed: 06/02/2020 Elsevier Patient Education  [place].

## 2020-12-23 NOTE — Progress Notes (Addendum)
Patient ID: Lisa Osborne, female    DOB: 02/05/1940, 80 y.o.   MRN: [id number]  This visit was conducted in person.  BP 140/70   Pulse 70   Temp (!) 97.5 F (36.4 C) (Temporal)   Ht 5\' 8"  (1.727 m)   Wt 232 lb 2 oz (105.3 kg)   SpO2 95%   BMI 35.29 kg/m    CC: AMW Subjective:   HPI: Lisa Osborne is a 80 y.o. female presenting on 12/23/2020 for Medicare Wellness   Did not see health advisor this year.   Hearing Screening - Comments:: Wears bilateral hearing aids.  Wearing at today's OV.  Vision Screening - Comments:: Last eye exam, 10/2020.  [place] Visit from 12/23/2020 in [place] at [facility]  PHQ-2 Total Score 0       Fall Risk  12/23/2020 09/04/2019 08/28/2018 08/21/2018 08/19/2017  Falls in the past year? 0 1 0 0 Yes  Comment - - - - fell downstairs and caused fractures to 2 toes  Number falls in past yr: - 1 - - 1  Injury with Fall? - 1 - - Yes  Comment - Concussion - - -  Risk for fall due to : - - - Medication side effect -  Follow up - - - Falls evaluation completed;Falls prevention discussed -  New hearing aid.   Notes worsening exertional dyspnea over the past year. Out of breath with walking even <50 feet. Occasional dizziness and palpitations. No claudication, chest pain.   Preventative: Colon cancer s/p lap LAR 03/2012 - clinical remission followed by Dr Benay Spice ONC. COLONOSCOPY Date: 03/2013 2 polyps, mild diverticulosis, rpt 3 yrs [name]) COLONOSCOPY 03/2016 5 TAs, fair prep, patent anastomosis, rpt 6-12 mo ([facility]) COLONOSCOPY 01/2017 diverticulosis, healthy anastomosis, rpt 5 yrs ([facility])  Colonoscopy 09/2020 - TA x10, int hem, consider rpt 3 yrs ([facility])  Well woman - s/p total hysterectomy 1985, ovaries removed as well.  Mammogram 02/2020 Birads1 @ [facility]  DEXA - done ~2005 in [place] and normal per patient.  Lung cancer screening - not eligible  Flu yearly [place] 02/2019,  03/2019, no booster but planning to get bivalent one Tetanus - 2010 Pneumovax 2008, prevnar-13 2015 Shingrix - discussed. to check at pharmacy  Advanced directive: Received, scanned 2019. HCPOA are daughter [name] or SIL [name]. Does not want prolonged life support if terminal condition but does want to receive artifical nutrition and hydration. If advanced dementia, would grant discretion to [facility]. [place] for organ donor. Wants to be cremated.  Seat belt use discussed Sunscreen use discussed. no changing moles on skin  Ex smoker quit 1969 Alcohol - 1 a week  Dentist - Q6 mo Eye exam - yearly Bowels - no constipation or diarrhea  Bladder - some dribbling throughout the day worse with standing - uses depends diapers daily. No stress incontinence symptoms.   Caffeine: 1 cup coffee/day Lives alone. 1 daughter [name]) Occupation: retired, was Chief Technology Officer. Activity: walking and swimming  Diet: good water, fruits/vegetables     Relevant past medical, surgical, family and social history reviewed and updated as indicated. Interim medical history since our last visit reviewed. Allergies and medications reviewed and updated. Outpatient Medications Prior to Visit  Medication Sig Dispense Refill   APPLE CIDER VINEGAR PO Take 1 capsule by mouth daily.     Calcium Citrate-Vitamin D (CALCIUM CITRATE + D PO) Take 1 tablet by mouth daily.  Coenzyme Q10 (COQ10) 200 MG CAPS Take 1 capsule by mouth daily.      CRANBERRY PO Take by mouth.     Ferrous Sulfate (IRON) 325 (65 Fe) MG TABS Take by mouth daily.     loratadine (CLARITIN) 10 MG tablet Take 10 mg by mouth daily as needed for allergies. Reported on 03/17/2015     Melatonin 5 MG TABS Take 5 mg by mouth at bedtime.      Omega-3 Fatty Acids (FISH OIL) 1000 MG CAPS Take 1 capsule by mouth daily.     vitamin B-12 (CYANOCOBALAMIN) 1000 MCG tablet Take 1,000 mcg by mouth daily.     atorvastatin (LIPITOR) 10 MG tablet TAKE 1 TABLET BY MOUTH  EVERY DAY 30 tablet 0   lansoprazole (PREVACID) 15 MG capsule TAKE 1 CAPSULE BY MOUTH EVERY DAY 90 capsule 0   levothyroxine (SYNTHROID) 25 MCG tablet TAKE 1 TABLET BY MOUTH EVERY DAY 90 tablet 0   sertraline (ZOLOFT) 100 MG tablet TAKE 1 TABLET BY MOUTH EVERY DAY 90 tablet 0   No facility-administered medications prior to visit.     Per HPI unless specifically indicated in ROS section below Review of Systems  Constitutional:  Positive for chills (mild). Negative for activity change, appetite change, fatigue, fever and unexpected weight change.  HENT:  Negative for hearing loss.   Eyes:  Negative for visual disturbance.  Respiratory:  Positive for shortness of breath (chronic). Negative for cough, chest tightness and wheezing.   Cardiovascular:  Positive for palpitations (occasional). Negative for chest pain and leg swelling.  Gastrointestinal:  Negative for abdominal distention, abdominal pain, blood in stool, constipation, diarrhea, nausea and vomiting.  Genitourinary:  Positive for urgency. Negative for difficulty urinating, dysuria and hematuria.  Musculoskeletal:  Negative for arthralgias, myalgias and neck pain.  Skin:  Negative for rash.  Neurological:  Positive for dizziness. Negative for seizures, syncope and headaches.  Hematological:  Negative for adenopathy. Bruises/bleeds easily.  Psychiatric/Behavioral:  Negative for dysphoric mood. The patient is not nervous/anxious.    Objective:  BP 140/70   Pulse 70   Temp (!) 97.5 F (36.4 C) (Temporal)   Ht 5\' 8"  (1.727 m)   Wt 232 lb 2 oz (105.3 kg)   SpO2 95%   BMI 35.29 kg/m   Wt Readings from Last 3 Encounters:  12/23/20 232 lb 2 oz (105.3 kg)  09/26/20 230 lb (104.3 kg)  09/15/20 230 lb (104.3 kg)      Physical Exam Vitals and nursing note reviewed.  Constitutional:      Appearance: Normal appearance. She is not ill-appearing.  HENT:     Head: Normocephalic and atraumatic.     Right Ear: Tympanic membrane, ear  canal and external ear normal. There is no impacted cerumen.     Left Ear: Tympanic membrane, ear canal and external ear normal. There is no impacted cerumen.  Eyes:     General:        Right eye: No discharge.        Left eye: No discharge.     Extraocular Movements: Extraocular movements intact.     Conjunctiva/sclera: Conjunctivae normal.     Pupils: Pupils are equal, round, and reactive to light.  Neck:     Thyroid: No thyroid mass or thyromegaly.     Vascular: No carotid bruit.  Cardiovascular:     Rate and Rhythm: Normal rate. Rhythm irregular.     Pulses: Normal pulses.     Heart sounds:  Normal heart sounds. No murmur heard. Pulmonary:     Effort: Pulmonary effort is normal. No respiratory distress.     Breath sounds: Normal breath sounds. No wheezing, rhonchi or rales.  Abdominal:     General: Bowel sounds are normal. There is no distension.     Palpations: Abdomen is soft. There is no mass.     Tenderness: There is no abdominal tenderness. There is no guarding or rebound.     Hernia: No hernia is present.  Musculoskeletal:     Cervical back: Normal range of motion and neck supple. No rigidity.     Right lower leg: No edema.     Left lower leg: No edema.  Lymphadenopathy:     Cervical: No cervical adenopathy.  Skin:    General: Skin is warm and dry.     Findings: No rash.  Neurological:     General: No focal deficit present.     Mental Status: She is alert. Mental status is at baseline.     Comments:  Recall 3/3 Calculation 5/5 DLROW  Psychiatric:        Mood and Affect: Mood normal.        Behavior: Behavior normal.      Results for orders placed or performed in visit on 12/23/20  POCT Urinalysis Dipstick (Automated)  Result Value Ref Range   Color, UA yellow    Clarity, UA clear    Glucose, UA Negative Negative   Bilirubin, UA 1+    Ketones, UA +/-    Spec Grav, UA 1.025 1.010 - 1.025   Blood, UA negative    pH, UA 5.5 5.0 - 8.0   Protein, UA Positive  (A) Negative   Urobilinogen, UA 0.2 0.2 or 1.0 E.U./dL   Nitrite, UA negative    Leukocytes, UA Trace (A) Negative   EKG - sinus arrhythmia ~60, LAD, normal intervals, no hypertrophy or acute ST/T changes, poor R wave progression, incomplete RBBB  Assessment & Plan:  This visit occurred during the SARS-CoV-2 public health emergency.  Safety protocols were in place, including screening questions prior to the visit, additional usage of staff PPE, and extensive cleaning of exam room while observing appropriate contact time as indicated for disinfecting solutions.   Problem List Items Addressed This Visit     Medicare annual wellness visit, subsequent - Primary (Chronic)    I have personally reviewed the Medicare Annual Wellness questionnaire and have noted 1. The patient's medical and social history 2. Their use of alcohol, tobacco or illicit drugs 3. Their current medications and supplements 4. The patient's functional ability including ADL's, fall risks, home safety risks and hearing or visual impairment. Cognitive function has been assessed and addressed as indicated.  5. Diet and physical activity 6. Evidence for depression or mood disorders The patients weight, height, BMI have been recorded in the chart. I have made referrals, counseling and provided education to the patient based on review of the above and I have provided the pt with a written personalized care plan for preventive services. Provider list updated.. See scanned questionairre as needed for further documentation. Reviewed preventative protocols and updated unless pt declined.       Encounter for general adult medical examination with abnormal findings (Chronic)    Preventative protocols reviewed and updated unless pt declined. Discussed healthy diet and lifestyle.       MDD (major depressive disorder), recurrent episode, moderate (HCC)    Overall stable period on sertraline 100mg  daily -  continue      Relevant  Medications   sertraline (ZOLOFT) 100 MG tablet   Prediabetes    Update A1c      Relevant Orders   Hemoglobin A1c   GERD (gastroesophageal reflux disease)    Stable period on prevacid 15mg  daily.       Relevant Medications   lansoprazole (PREVACID) 15 MG capsule   Hypothyroidism    Chronic. Update levels on low dose replacement.       Relevant Medications   levothyroxine (SYNTHROID) 25 MCG tablet   Other Relevant Orders   TSH   Urge incontinence    Mild, longstanding. Check UA, price out myrbetriq. Avoiding antimuscarinics to anticholinergic properties.       Relevant Medications   mirabegron ER (MYRBETRIQ) 25 MG TB24 tablet   Other Relevant Orders   POCT Urinalysis Dipstick (Automated) (Completed)   Colon adenocarcinoma - rectosigmoid T3N1 (1/15) s/p lap LAR 04/14/2012    H/o this. Last saw onc 2019.  Recent colonoscopy with many polyps planning GI f/u in 3 years.       Severe obesity (BMI 35.0-39.9) with comorbidity ([place])    Encouraged healthy diet and lifestyle choices to affect sustainable weight loss.      HLD (hyperlipidemia)    Chronic, off statin. Update FLP.  The 10-year ASCVD risk score (Arnett DK, et al., 2019) is: 25.7%   Values used to calculate the score:     Age: 45 years     Sex: Female     Is Non-Hispanic African American: No     Diabetic: No     Tobacco smoker: No     Systolic Blood Pressure: 580 mmHg     Is BP treated: No     HDL Cholesterol: 62.5 mg/dL     Total Cholesterol: 224 mg/dL       Relevant Medications   atorvastatin (LIPITOR) 10 MG tablet   Other Relevant Orders   Lipid panel   Comprehensive metabolic panel   Acquired deafness of left ear   CKD (chronic kidney disease) stage 3, GFR 30-59 ml/min (HCC)    Update labs today.       Relevant Orders   Comprehensive metabolic panel   CBC with Differential/Platelet   Exertional dyspnea    New and progressive over the past year. Lungs clear on exam, heart somewhat irregular. Work  up with CXR, EKG, and labs. Pending results consider echo vs cardiology evaluation.       Relevant Orders   DG Chest 2 View   CBC with Differential/Platelet   Brain natriuretic peptide   Other Visit Diagnoses     Need for hepatitis C screening test       Relevant Orders   Hepatitis C antibody   Irregular heart beat       Relevant Orders   EKG 12-Lead (Completed)        Meds ordered this encounter  Medications   atorvastatin (LIPITOR) 10 MG tablet    Sig: Take 1 tablet (10 mg total) by mouth daily.    Dispense:  90 tablet    Refill:  3   lansoprazole (PREVACID) 15 MG capsule    Sig: Take 1 capsule (15 mg total) by mouth daily.    Dispense:  90 capsule    Refill:  3   levothyroxine (SYNTHROID) 25 MCG tablet    Sig: Take 1 tablet (25 mcg total) by mouth daily.    Dispense:  90 tablet  Refill:  3   sertraline (ZOLOFT) 100 MG tablet    Sig: Take 1 tablet (100 mg total) by mouth daily.    Dispense:  90 tablet    Refill:  3   mirabegron ER (MYRBETRIQ) 25 MG TB24 tablet    Sig: Take 1 tablet (25 mg total) by mouth daily.    Dispense:  30 tablet    Refill:  3    Orders Placed This Encounter  Procedures   DG Chest 2 View    Standing Status:   Future    Number of Occurrences:   1    Standing Expiration Date:   12/23/2021    Order Specific Question:   Reason for Exam (SYMPTOM  OR DIAGNOSIS REQUIRED)    Answer:   exertional dypsnea    Order Specific Question:   Preferred imaging location?    Answer:   [facility]   Lipid panel   Comprehensive metabolic panel   TSH   Hemoglobin A1c   CBC with Differential/Platelet   Brain natriuretic peptide   Hepatitis C antibody   POCT Urinalysis Dipstick (Automated)   EKG 12-Lead    Patient instructions: Labs today, urinalysis today EKG and chest xray today  We will be in touch with results.  If interested, check with pharmacy about new 2 shot shingles series (shingrix).  Price out [employer] 25mg  daily for  bladder. Update me with effect or if unaffordable.  Return in 6 month for follow up visit, sooner if needed.   Follow up plan: Return in about 6 months (around 06/22/2021) for follow up visit.  Ria Bush, MD

## 2020-12-23 NOTE — Assessment & Plan Note (Signed)

## 2020-12-23 NOTE — Assessment & Plan Note (Signed)
Preventative protocols reviewed and updated unless pt declined. Discussed healthy diet and lifestyle.  

## 2020-12-24 DIAGNOSIS — R0609 Other forms of dyspnea: Secondary | ICD-10-CM | POA: Insufficient documentation

## 2020-12-24 LAB — CBC WITH DIFFERENTIAL/PLATELET
Basophils Absolute: 0 10*3/uL (ref 0.0–0.1)
Basophils Relative: 0.7 % (ref 0.0–3.0)
Eosinophils Absolute: 0 10*3/uL (ref 0.0–0.7)
Eosinophils Relative: 0.1 % (ref 0.0–5.0)
HCT: 35.1 % — ABNORMAL LOW (ref 36.0–46.0)
Hemoglobin: 12 g/dL (ref 12.0–15.0)
Lymphocytes Relative: 34.1 % (ref 12.0–46.0)
Lymphs Abs: 1.5 10*3/uL (ref 0.7–4.0)
MCHC: 34.2 g/dL (ref 30.0–36.0)
MCV: 96.8 fl (ref 78.0–100.0)
Monocytes Absolute: 0.8 10*3/uL (ref 0.1–1.0)
Monocytes Relative: 17.7 % — ABNORMAL HIGH (ref 3.0–12.0)
Neutro Abs: 2 10*3/uL (ref 1.4–7.7)
Neutrophils Relative %: 47.4 % (ref 43.0–77.0)
Platelets: 157 10*3/uL (ref 150.0–400.0)
RBC: 3.63 Mil/uL — ABNORMAL LOW (ref 3.87–5.11)
RDW: 12.7 % (ref 11.5–15.5)
WBC: 4.3 10*3/uL (ref 4.0–10.5)

## 2020-12-24 LAB — COMPREHENSIVE METABOLIC PANEL
ALT: 9 U/L (ref 0–35)
AST: 18 U/L (ref 0–37)
Albumin: 4.5 g/dL (ref 3.5–5.2)
Alkaline Phosphatase: 56 U/L (ref 39–117)
BUN: 19 mg/dL (ref 6–23)
CO2: 28 mEq/L (ref 19–32)
Calcium: 9.5 mg/dL (ref 8.4–10.5)
Chloride: 103 mEq/L (ref 96–112)
Creatinine, Ser: 1.09 mg/dL (ref 0.40–1.20)
GFR: 48.5 mL/min — ABNORMAL LOW (ref >60.00)
Glucose, Bld: 131 mg/dL — ABNORMAL HIGH (ref 70–99)
Potassium: 3.9 mEq/L (ref 3.5–5.1)
Sodium: 140 mEq/L (ref 135–145)
Total Bilirubin: 0.5 mg/dL (ref 0.2–1.2)
Total Protein: 7.2 g/dL (ref 6.0–8.3)

## 2020-12-24 LAB — BRAIN NATRIURETIC PEPTIDE: Pro B Natriuretic peptide (BNP): 55 pg/mL (ref 0.0–100.0)

## 2020-12-24 LAB — HEMOGLOBIN A1C: Hgb A1c MFr Bld: 6.3 % (ref 4.6–6.5)

## 2020-12-24 LAB — LIPID PANEL
Cholesterol: 142 mg/dL (ref 0–200)
HDL: 57.2 mg/dL (ref >39.00)
LDL Cholesterol: 48 mg/dL (ref 0–99)
NonHDL: 84.81
Total CHOL/HDL Ratio: 2
Triglycerides: 183 mg/dL — ABNORMAL HIGH (ref 0.0–149.0)
VLDL: 36.6 mg/dL (ref 0.0–40.0)

## 2020-12-24 LAB — TSH: TSH: 1.3 u[IU]/mL (ref 0.35–5.50)

## 2020-12-24 LAB — HEPATITIS C ANTIBODY
Hepatitis C Ab: NONREACTIVE
SIGNAL TO CUT-OFF: <0.02 (ref <1.00)

## 2020-12-24 MED ORDER — LEVOTHYROXINE SODIUM 25 MCG PO TABS: 25.0000 ug | ORAL_TABLET | Freq: Every day | ORAL | 3 refills | Status: DC

## 2020-12-24 MED ORDER — ATORVASTATIN CALCIUM 10 MG PO TABS: 10.0000 mg | ORAL_TABLET | Freq: Every day | ORAL | 3 refills | Status: DC

## 2020-12-24 MED ORDER — SERTRALINE HCL 100 MG PO TABS: 100.0000 mg | ORAL_TABLET | Freq: Every day | ORAL | 3 refills | Status: DC

## 2020-12-24 MED ORDER — LANSOPRAZOLE 15 MG PO CPDR
15.0000 mg | DELAYED_RELEASE_CAPSULE | Freq: Every day | ORAL | 3 refills | Status: DC
Start: 2020-12-24 — End: 2021-12-11

## 2020-12-24 MED ORDER — MIRABEGRON ER 25 MG PO TB24: 25.0000 mg | ORAL_TABLET | Freq: Every day | ORAL | 3 refills | Status: DC

## 2020-12-24 NOTE — Assessment & Plan Note (Signed)
Update labs today 

## 2020-12-24 NOTE — Assessment & Plan Note (Signed)
Chronic. Update levels on low dose replacement.

## 2020-12-24 NOTE — Assessment & Plan Note (Signed)
Encouraged healthy diet and lifestyle choices to affect sustainable weight loss.  ?

## 2020-12-24 NOTE — Assessment & Plan Note (Signed)
Chronic, off statin. Update FLP.  The 10-year ASCVD risk score (Arnett DK, et al., 2019) is: 25.7%   Values used to calculate the score:     Age: 80 years     Sex: Female     Is Non-Hispanic African American: No     Diabetic: No     Tobacco smoker: No     Systolic Blood Pressure: 973 mmHg     Is BP treated: No     HDL Cholesterol: 62.5 mg/dL     Total Cholesterol: 224 mg/dL

## 2020-12-24 NOTE — Assessment & Plan Note (Signed)
Update A1c ?

## 2020-12-24 NOTE — Assessment & Plan Note (Signed)
Overall stable period on sertraline 100mg  daily - continue

## 2020-12-24 NOTE — Assessment & Plan Note (Signed)
Mild, longstanding. Check UA, price out myrbetriq. Avoiding antimuscarinics to anticholinergic properties.

## 2020-12-24 NOTE — Assessment & Plan Note (Signed)
H/o this. Last saw onc 2019.  Recent colonoscopy with many polyps planning GI f/u in 3 years.

## 2020-12-24 NOTE — Assessment & Plan Note (Signed)
Stable period on prevacid 15mg daily 

## 2020-12-24 NOTE — Assessment & Plan Note (Addendum)
New and progressive over the past year. Lungs clear on exam, heart somewhat irregular. Work up with CXR, EKG, and labs. Pending results consider echo vs cardiology evaluation.

## 2020-12-29 ENCOUNTER — Other Ambulatory Visit: Payer: Self-pay | Admitting: Family Medicine

## 2020-12-29 ENCOUNTER — Encounter: Payer: Self-pay | Admitting: Family Medicine

## 2020-12-29 DIAGNOSIS — R911 Solitary pulmonary nodule: Secondary | ICD-10-CM

## 2020-12-29 DIAGNOSIS — R0609 Other forms of dyspnea: Secondary | ICD-10-CM

## 2020-12-31 NOTE — Progress Notes (Signed)
New order placed

## 2020-12-31 NOTE — Addendum Note (Signed)
Addended by: Ria Bush on: 12/31/2020 02:48 PM   Modules accepted: Orders

## 2021-01-02 DIAGNOSIS — Z20822 Contact with and (suspected) exposure to covid-19: Secondary | ICD-10-CM | POA: Diagnosis not present

## 2021-01-06 ENCOUNTER — Encounter: Payer: Self-pay | Admitting: *Deleted

## 2021-01-22 ENCOUNTER — Other Ambulatory Visit: Payer: Self-pay

## 2021-01-22 ENCOUNTER — Ambulatory Visit (INDEPENDENT_AMBULATORY_CARE_PROVIDER_SITE_OTHER)
Admission: RE | Admit: 2021-01-22 | Discharge: 2021-01-22 | Disposition: A | Discharge status: Home / Self Care | Payer: Medicare PPO | Source: Ambulatory Visit | Attending: Family Medicine | Admitting: Family Medicine

## 2021-01-22 DIAGNOSIS — R918 Other nonspecific abnormal finding of lung field: Secondary | ICD-10-CM | POA: Diagnosis not present

## 2021-01-22 DIAGNOSIS — I700 Atherosclerosis of aorta: Secondary | ICD-10-CM | POA: Diagnosis not present

## 2021-01-22 DIAGNOSIS — R911 Solitary pulmonary nodule: Secondary | ICD-10-CM

## 2021-01-27 ENCOUNTER — Encounter: Payer: Self-pay | Admitting: Family Medicine

## 2021-01-27 ENCOUNTER — Other Ambulatory Visit: Payer: Self-pay | Admitting: Family Medicine

## 2021-01-27 DIAGNOSIS — I700 Atherosclerosis of aorta: Secondary | ICD-10-CM | POA: Insufficient documentation

## 2021-01-27 DIAGNOSIS — I359 Nonrheumatic aortic valve disorder, unspecified: Secondary | ICD-10-CM

## 2021-01-27 DIAGNOSIS — R0609 Other forms of dyspnea: Secondary | ICD-10-CM

## 2021-01-27 DIAGNOSIS — I2510 Atherosclerotic heart disease of native coronary artery without angina pectoris: Secondary | ICD-10-CM | POA: Insufficient documentation

## 2021-02-20 NOTE — Progress Notes (Signed)
CARDIOLOGY CONSULT NOTE       Patient ID: Geniene List MRN: [id number] DOB/AGE: 1940/03/09 81 y.o.  Admit date: (Not on file) Referring Physician: Danise Mina Primary Physician: Ria Bush, MD Primary Cardiologist: New Reason for Consultation: CAD/AVD  Active Problems:   * No active hospital problems. *   HPI:  81 y.o. referred by Dr Darnell Level for CAD/AVD. She had a CT scan on 01/22/21 for f/u of lung nodule which was benign 3 mm < Radiologist remarked on calcification of the AV and aortic atherosclerosis. She has had chronic exertional dyspnea somewhat worse over last few months   ECG done 12/24/20 read as flutter but artifact SB rate 55 ICRBBB  She has no chest pain, palpitations or syncope Denies history of murmur She quit smoking in 70.  She has had more dyspnea last 6-8 months No cough sputum pleurisy. Has not had COVID just vaccines No wheezing or exposures   Weight is stable and no PND/orthopnea or signs of CHF   ROS All other systems reviewed and negative except as noted above  Past Medical History:  Diagnosis Date   Abnormal EKG    incomplete RBBB   Acquired deafness of left ear    Acute cholecystitis    Allergy    Anemia    Anxiety    Arthritis    left side lower back pain   Brown recluse spider bite 03/2018   Colon adenocarcinoma - rectosigmoid T3N1 (1/15) s/p lap LAR 04/14/2012   Status post laparoscopic lower anterior resection   Dehydration 02/2015   Depression    severe in past   Diabetes mellitus type 2, controlled ([place]) 2006   diet controlled   GERD (gastroesophageal reflux disease)    bad if off PPI   History of chicken pox    History of iron deficiency anemia    HLD (hyperlipidemia) 08/03/2015   Hypothyroidism    mild   Laceration of fifth toe, right 08/26/2017   Neuromuscular disorder (HCC)    neuropathy both great toes   Personal history of chemotherapy    Rosacea    Seasonal allergies    Unilateral deafnesses    left ear  deaf, wears hearing aid right side   Urine incontinence    mild, wears pad    Family History  Problem Relation Age of Onset   Breast cancer Mother 75       breast cancer then multiple myeloma   Alcohol abuse Father    Heart disease Father        heart failure   Mental illness Brother        2 brothers severe depression, suicide x2   Diabetes Maternal Aunt    Diabetes Maternal Uncle    Diabetes Paternal Uncle    Diabetes Paternal Grandfather    Coronary artery disease Neg Hx    Stroke Neg Hx    Colon cancer Neg Hx    Esophageal cancer Neg Hx    Rectal cancer Neg Hx    Stomach cancer Neg Hx    Liver disease Neg Hx    Pancreatic cancer Neg Hx    Prostate cancer Neg Hx    Colon polyps Neg Hx     Social History   Socioeconomic History   Marital status: Single    Spouse name: Not on file   Number of children: 1   Years of education: Not on file   Highest education level: Not on file  Occupational History  Occupation: Retired Product manager: RETIRED  Tobacco Use   Smoking status: Former    Types: Cigarettes    Quit date: 04/06/1967    Years since quitting: 53.9   Smokeless tobacco: Never  Vaping Use   Vaping Use: Never used  Substance and Sexual Activity   Alcohol use: Yes    Alcohol/week: 1.0 standard drink    Types: 1 Shots of liquor per week    Comment: 1 per week   Drug use: No   Sexual activity: Not Currently  Other Topics Concern   Not on file  Social History Narrative   Caffeine: 1 cup coffee/day   Lives alone.  1 daughter [name]), 3 stillborns   Occupation: retired, was Chief Technology Officer.   Activity: no regular activity but does park far away, has treadmill at home   Diet: good fruits/vegetables, more fruit/vegetable smoothies  good amt water   Social Determinants of Health   Financial Resource Strain: Not on file  Food Insecurity: Not on file  Transportation Needs: Not on file  Physical Activity: Not on file  Stress: Not on file  Social  Connections: Not on file  Intimate Partner Violence: Not on file    Past Surgical History:  Procedure Laterality Date   ABDOMINOPLASTY  1996   CHOLECYSTECTOMY N/A 03/20/2015   Procedure: LAPAROSCOPIC CHOLECYSTECTOMY;  Surgeon: Florene Glen, MD;  Location: [facility] ORS;  Service: General;  Laterality: N/A;   COLONOSCOPY  08/2006   normal   COLONOSCOPY  03/2013   2 polyps, mild diverticulosis, rpt 3 yrs [name])   COLONOSCOPY  03/2016   5 TAs, fair prep, patent anastomosis, rpt 6-12 mo ([place])   COLONOSCOPY  01/2017   diverticulosis, healthy anastomosis, rpt 5 yrs ([place])   COLONOSCOPY  09/2020   TA x10, int hem, consider rpt 3 yrs ([place])   FACELIFT  05/2011   INCONTINENCE SURGERY  1996   LAPAROSCOPIC LOW ANTERIOR RESECTION N/A 04/14/2012   colon cancer at rectosigmoid junction s/p rigid proctoscopy splenic flexure mobilization lysis of adhesion;  Surgeon: Adin Hector, MD;    MOUTH SURGERY     crown repair   TOTAL VAGINAL HYSTERECTOMY  1985   cancer cells on cervix      Current Outpatient Medications:    APPLE CIDER VINEGAR PO, Take 1 capsule by mouth daily., Disp: , Rfl:    atorvastatin (LIPITOR) 10 MG tablet, Take 1 tablet (10 mg total) by mouth daily., Disp: 90 tablet, Rfl: 3   Calcium Citrate-Vitamin D (CALCIUM CITRATE + D PO), Take 1 tablet by mouth daily. , Disp: , Rfl:    Coenzyme Q10 (COQ10) 200 MG CAPS, Take 1 capsule by mouth daily. , Disp: , Rfl:    CRANBERRY PO, Take by mouth., Disp: , Rfl:    Ferrous Sulfate (IRON) 325 (65 Fe) MG TABS, Take by mouth daily., Disp: , Rfl:    lansoprazole (PREVACID) 15 MG capsule, Take 1 capsule (15 mg total) by mouth daily., Disp: 90 capsule, Rfl: 3   levothyroxine (SYNTHROID) 25 MCG tablet, Take 1 tablet (25 mcg total) by mouth daily., Disp: 90 tablet, Rfl: 3   loratadine (CLARITIN) 10 MG tablet, Take 10 mg by mouth daily as needed for allergies. Reported on 03/17/2015, Disp: , Rfl:    Melatonin 5 MG TABS, Take 5 mg  by mouth at bedtime. , Disp: , Rfl:    mirabegron ER (MYRBETRIQ) 25 MG TB24 tablet, Take 1 tablet (25 mg total) by mouth  daily., Disp: 30 tablet, Rfl: 3   Omega-3 Fatty Acids (FISH OIL) 1000 MG CAPS, Take 1 capsule by mouth daily., Disp: , Rfl:    sertraline (ZOLOFT) 100 MG tablet, Take 1 tablet (100 mg total) by mouth daily., Disp: 90 tablet, Rfl: 3   vitamin B-12 (CYANOCOBALAMIN) 1000 MCG tablet, Take 1,000 mcg by mouth daily., Disp: , Rfl:     Physical Exam: Blood pressure 130/74, pulse 80, height 5' 8" (1.727 m), weight 229 lb 12.8 oz (104.2 kg), SpO2 97 %.   Affect appropriate Healthy:  appears stated age 4: normal Neck supple with no adenopathy JVP normal no bruits no thyromegaly Lungs clear with no wheezing and good diaphragmatic motion Heart:  S1/S2 1/6 SEM  murmur, no rub, gallop or click PMI normal Abdomen: benighn, BS positve, no tenderness, no AAA no bruit.  No HSM or HJR Distal pulses intact with no bruits No edema Neuro non-focal Skin warm and dry No muscular weakness   Labs:   Lab Results  Component Value Date   WBC 4.3 12/23/2020   HGB 12.0 12/23/2020   HCT 35.1 (L) 12/23/2020   MCV 96.8 12/23/2020   PLT 157.0 12/23/2020   No results for input(s): NA, K, CL, CO2, BUN, CREATININE, CALCIUM, PROT, BILITOT, ALKPHOS, ALT, AST, GLUCOSE in the last 168 hours.  Invalid input(s): LABALBU No results found for: CKTOTAL, CKMB, CKMBINDEX, TROPONINI  Lab Results  Component Value Date   CHOL 142 12/23/2020   CHOL 224 (H) 09/04/2019   CHOL 222 (H) 08/22/2018   Lab Results  Component Value Date   HDL 57.20 12/23/2020   HDL 62.50 09/04/2019   HDL 61.60 08/22/2018   Lab Results  Component Value Date   LDLCALC 48 12/23/2020   LDLCALC 130 (H) 09/04/2019   LDLCALC 126 (H) 08/22/2018   Lab Results  Component Value Date   TRIG 183.0 (H) 12/23/2020   TRIG 157.0 (H) 09/04/2019   TRIG 171.0 (H) 08/22/2018   Lab Results  Component Value Date   CHOLHDL 2  12/23/2020   CHOLHDL 4 09/04/2019   CHOLHDL 4 08/22/2018   Lab Results  Component Value Date   LDLDIRECT 132.0 08/19/2017   LDLDIRECT 109.0 06/07/2014   LDLDIRECT 140.2 08/15/2013      Radiology: No results found.  EKG: SEE HPI   ASSESSMENT AND PLAN:   AVD:  calcium noted on CT with SEM echo to assess for AS but not significant on exam Dyspnea:  seems functional normal lung exam Check echo and PfTls  ICRBBB:  abnormal ECG not acute dates back to atleast 2014  HLD:  continue statin Thyroid:  continue synthroid replacement  TSH normal 12/23/20  Anxiety/Depression:  on Zoloft  GERD:  low carb diet Prevacid   PFT;s with [facility] Echo  F/U PRN pending results   Signed: Jenkins Rouge 02/23/2021, 3:01 PM

## 2021-02-23 ENCOUNTER — Telehealth: Payer: Self-pay | Admitting: Family Medicine

## 2021-02-23 ENCOUNTER — Ambulatory Visit: Payer: Medicare PPO | Admitting: Cardiovascular Disease

## 2021-02-23 ENCOUNTER — Encounter: Payer: Self-pay | Admitting: Cardiovascular Disease

## 2021-02-23 ENCOUNTER — Other Ambulatory Visit: Payer: Self-pay

## 2021-02-23 VITALS — BP 130/74 | HR 80 | Ht 68.0 in | Wt 229.8 lb

## 2021-02-23 DIAGNOSIS — R0600 Dyspnea, unspecified: Secondary | ICD-10-CM | POA: Diagnosis not present

## 2021-02-23 DIAGNOSIS — I700 Atherosclerosis of aorta: Secondary | ICD-10-CM | POA: Diagnosis not present

## 2021-02-23 DIAGNOSIS — I4510 Unspecified right bundle-branch block: Secondary | ICD-10-CM

## 2021-02-23 DIAGNOSIS — I359 Nonrheumatic aortic valve disorder, unspecified: Secondary | ICD-10-CM | POA: Diagnosis not present

## 2021-02-23 NOTE — Patient Instructions (Signed)
Medication Instructions:  Your physician recommends that you continue on your current medications as directed. Please refer to the Current Medication list given to you today.  *If you need a refill on your cardiac medications before your next appointment, please call your pharmacy*  Lab Work: If you have labs (blood work) drawn today and your tests are completely normal, you will receive your results only by: [place] (if you have MyChart) OR A paper copy in the mail If you have any lab test that is abnormal or we need to change your treatment, we will call you to review the results.  Testing/Procedures: Your physician has requested that you have an echocardiogram. Echocardiography is a painless test that uses sound waves to create images of your heart. It provides your doctor with information about the size and shape of your heart and how well your hearts chambers and valves are working. This procedure takes approximately one hour. There are no restrictions for this procedure.  Your physician has recommended that you have a pulmonary function test. Pulmonary Function Tests are a group of tests that measure how well air moves in and out of your lungs.  Follow-Up: At [facility], you and your health needs are our priority.  As part of our continuing mission to provide you with exceptional heart care, we have created designated Provider Care Teams.  These Care Teams include your primary Cardiologist (physician) and Advanced Practice Providers (APPs -  Physician Assistants and Nurse Practitioners) who all work together to provide you with the care you need, when you need it.  We recommend signing up for the patient portal called "MyChart".  Sign up information is provided on this After Visit Summary.  MyChart is used to connect with patients for Virtual Visits (Telemedicine).  Patients are able to view lab/test results, encounter notes, upcoming appointments, etc.  Non-urgent messages can  be sent to your provider as well.   To learn more about what you can do with MyChart, go to [email or web address].    Your next appointment:   As needed  The format for your next appointment:   In Person  Provider:   Jenkins Rouge, MD {

## 2021-02-24 NOTE — Telephone Encounter (Signed)
Noted  

## 2021-02-24 NOTE — Telephone Encounter (Signed)
Spoke to patient and she is going to contact her insurance company and find out if they have a medication that they prefer that would be less expensive for her. Patient is going to call back with the information that she finds out.

## 2021-02-26 ENCOUNTER — Other Ambulatory Visit: Payer: Self-pay | Admitting: Family Medicine

## 2021-02-26 DIAGNOSIS — Z1231 Encounter for screening mammogram for malignant neoplasm of breast: Secondary | ICD-10-CM

## 2021-03-02 ENCOUNTER — Ambulatory Visit (HOSPITAL_COMMUNITY): Payer: Medicare PPO

## 2021-03-10 ENCOUNTER — Ambulatory Visit (HOSPITAL_COMMUNITY): Payer: Medicare PPO | Attending: Cardiology

## 2021-03-10 ENCOUNTER — Other Ambulatory Visit: Payer: Self-pay

## 2021-03-10 DIAGNOSIS — I359 Nonrheumatic aortic valve disorder, unspecified: Secondary | ICD-10-CM | POA: Diagnosis not present

## 2021-03-10 DIAGNOSIS — R0600 Dyspnea, unspecified: Secondary | ICD-10-CM | POA: Insufficient documentation

## 2021-03-10 DIAGNOSIS — I700 Atherosclerosis of aorta: Secondary | ICD-10-CM

## 2021-03-10 DIAGNOSIS — R0609 Other forms of dyspnea: Secondary | ICD-10-CM

## 2021-03-10 LAB — ECHOCARDIOGRAM COMPLETE
Area-P 1/2: 2.73 cm2
S' Lateral: 2.9 cm

## 2021-03-13 ENCOUNTER — Ambulatory Visit (INDEPENDENT_AMBULATORY_CARE_PROVIDER_SITE_OTHER): Payer: Medicare PPO | Admitting: Internal Medicine

## 2021-03-13 ENCOUNTER — Other Ambulatory Visit: Payer: Self-pay

## 2021-03-13 DIAGNOSIS — I359 Nonrheumatic aortic valve disorder, unspecified: Secondary | ICD-10-CM

## 2021-03-13 DIAGNOSIS — R0600 Dyspnea, unspecified: Secondary | ICD-10-CM

## 2021-03-13 DIAGNOSIS — I700 Atherosclerosis of aorta: Secondary | ICD-10-CM

## 2021-03-13 LAB — PULMONARY FUNCTION TEST
DL/VA % pred: 119 %
DL/VA: 4.72 ml/min/mmHg/L
DLCO cor % pred: 91 %
DLCO cor: 19.7 ml/min/mmHg
DLCO unc % pred: 91 %
DLCO unc: 19.7 ml/min/mmHg
FEF 25-75 Post: 1.85 L/sec
FEF 25-75 Pre: 2.21 L/sec
FEF2575-%Change-Post: -16 %
FEF2575-%Pred-Post: 109 %
FEF2575-%Pred-Pre: 130 %
FEV1-%Change-Post: -4 %
FEV1-%Pred-Post: 86 %
FEV1-%Pred-Pre: 90 %
FEV1-Post: 2.06 L
FEV1-Pre: 2.14 L
FEV1FVC-%Change-Post: 0 %
FEV1FVC-%Pred-Pre: 109 %
FEV6-%Change-Post: -3 %
FEV6-%Pred-Post: 84 %
FEV6-%Pred-Pre: 88 %
FEV6-Post: 2.54 L
FEV6-Pre: 2.64 L
FEV6FVC-%Pred-Post: 105 %
FEV6FVC-%Pred-Pre: 105 %
FVC-%Change-Post: -3 %
FVC-%Pred-Post: 80 %
FVC-%Pred-Pre: 83 %
FVC-Post: 2.54 L
FVC-Pre: 2.64 L
Post FEV1/FVC ratio: 81 %
Post FEV6/FVC ratio: 100 %
Pre FEV1/FVC ratio: 81 %
Pre FEV6/FVC Ratio: 100 %
RV % pred: 64 %
RV: 1.66 L
TLC % pred: 72 %
TLC: 4.12 L

## 2021-03-13 NOTE — Patient Instructions (Signed)
Full PFT performed today. °

## 2021-03-13 NOTE — Progress Notes (Signed)
Full PFT performed today. °

## 2021-04-07 ENCOUNTER — Other Ambulatory Visit: Payer: Self-pay

## 2021-04-07 ENCOUNTER — Ambulatory Visit
Admission: RE | Admit: 2021-04-07 | Discharge: 2021-04-07 | Disposition: A | Discharge status: Home / Self Care | Payer: Medicare PPO | Source: Ambulatory Visit | Attending: Family Medicine | Admitting: Family Medicine

## 2021-04-07 DIAGNOSIS — Z1231 Encounter for screening mammogram for malignant neoplasm of breast: Secondary | ICD-10-CM | POA: Insufficient documentation

## 2021-06-24 ENCOUNTER — Ambulatory Visit: Payer: Medicare PPO | Admitting: Family Medicine

## 2021-06-29 ENCOUNTER — Encounter: Payer: Self-pay | Admitting: Family Medicine

## 2021-06-29 ENCOUNTER — Ambulatory Visit (INDEPENDENT_AMBULATORY_CARE_PROVIDER_SITE_OTHER): Payer: Medicare PPO | Admitting: Family Medicine

## 2021-06-29 VITALS — BP 120/76 | HR 70 | Temp 97.7°F | Ht 68.0 in | Wt 225.5 lb

## 2021-06-29 DIAGNOSIS — R0609 Other forms of dyspnea: Secondary | ICD-10-CM

## 2021-06-29 DIAGNOSIS — R2681 Unsteadiness on feet: Secondary | ICD-10-CM

## 2021-06-29 DIAGNOSIS — N3941 Urge incontinence: Secondary | ICD-10-CM | POA: Diagnosis not present

## 2021-06-29 DIAGNOSIS — H9192 Unspecified hearing loss, left ear: Secondary | ICD-10-CM | POA: Diagnosis not present

## 2021-06-29 DIAGNOSIS — R42 Dizziness and giddiness: Secondary | ICD-10-CM | POA: Diagnosis not present

## 2021-06-29 MED ORDER — OXYBUTYNIN CHLORIDE 5 MG PO TABS: 5.0000 mg | ORAL_TABLET | Freq: Two times a day (BID) | ORAL | 6 refills | Status: DC | PRN

## 2021-06-29 NOTE — Patient Instructions (Addendum)
We will try oxybutynin '5mg'$  nightly for bladder. May take 2nd dose during the day as needed.  I will refer you to ENT in [facility] (ear doctor) for evaluation of left sided hearing loss + vertigo.  Good to see you today Return in 6 months for physical/wellness visit

## 2021-06-29 NOTE — Progress Notes (Unsigned)
Patient ID: Nalah Macioce, female    DOB: [birth year], 81 y.o.   MRN: [id number]  This visit was conducted in person.  BP 120/76   Pulse 70   Temp 97.7 F (36.5 C) (Temporal)   Ht '5\' 8"'$  (1.727 m)   Wt 225 lb 8 oz (102.3 kg)   SpO2 95%   BMI 34.29 kg/m    CC: 6 mo f/u visit  Subjective:   HPI: Eilidh Marcano is a 81 y.o. female presenting on 06/29/2021 for Follow-up (Here for 6 mo f/u.  States Myrbetriq was too expensive [about $300/mth].  Wants to try oxybutynin XR as alternative. )   See prior note for details.  Saw cardiology earlier this year for CAD and worsening exertional dyspnea ([place]).  Chest CT overall reassuring.  S/p full PFTs ([place] - 02/2021) - mild restriction o/w WNL.  S/p echocardiogram 02/2021 - EF 60-65%, mild LVH, aortic sclerosis without stenosis.   For the past month notes progressively worsening L lower ribcage pain (below the ribs). Worsening in the past week. Denies inciting trauma/injury to area, no bruising or rash. No bowel changes, blood in stool, other abd pain, nausea/vomiting. H/o colon cancer, UTD colonoscopy (2022).  Urinary incontinence - Myrbetriq '25mg'$  became too expensive 01/2021 (>$300/month). Notes dribbling with standing - wears depends diapers daily, nocturia almost every hour. She does avoid drinking past 6pm.   Notes ongoing vertigo dizziness worse with head turning affecting balance, present for several years. Mild tinnitus. No lightheadedness or presyncope. No falls. Modified epleys are helpful. Has not underwent vestibular rehab. Occasional tinnitus. Has seen audiologist, wears R ear hearing aide but doesn't like this due to increased feedback. No n/v.      Relevant past medical, surgical, family and social history reviewed and updated as indicated. Interim medical history since our last visit reviewed. Allergies and medications reviewed and updated. Outpatient Medications Prior to Visit  Medication Sig Dispense Refill    APPLE CIDER VINEGAR PO Take 1 capsule by mouth daily.     atorvastatin (LIPITOR) 10 MG tablet Take 1 tablet (10 mg total) by mouth daily. 90 tablet 3   Calcium Citrate-Vitamin D (CALCIUM CITRATE + D PO) Take 1 tablet by mouth daily.      Coenzyme Q10 (COQ10) 200 MG CAPS Take 1 capsule by mouth daily.      CRANBERRY PO Take by mouth.     Ferrous Sulfate (IRON) 325 (65 Fe) MG TABS Take by mouth daily.     lansoprazole (PREVACID) 15 MG capsule Take 1 capsule (15 mg total) by mouth daily. 90 capsule 3   levothyroxine (SYNTHROID) 25 MCG tablet Take 1 tablet (25 mcg total) by mouth daily. 90 tablet 3   loratadine (CLARITIN) 10 MG tablet Take 10 mg by mouth daily as needed for allergies. Reported on 03/17/2015     Melatonin 5 MG TABS Take 5 mg by mouth at bedtime.      Omega-3 Fatty Acids (FISH OIL) 1000 MG CAPS Take 1 capsule by mouth daily.     sertraline (ZOLOFT) 100 MG tablet Take 1 tablet (100 mg total) by mouth daily. 90 tablet 3   vitamin B-12 (CYANOCOBALAMIN) 1000 MCG tablet Take 1,000 mcg by mouth daily.     mirabegron ER (MYRBETRIQ) 25 MG TB24 tablet Take 1 tablet (25 mg total) by mouth daily. 30 tablet 3   No facility-administered medications prior to visit.     Per HPI unless specifically indicated in ROS section  below Review of Systems  Objective:  BP 120/76   Pulse 70   Temp 97.7 F (36.5 C) (Temporal)   Ht '5\' 8"'$  (1.727 m)   Wt 225 lb 8 oz (102.3 kg)   SpO2 95%   BMI 34.29 kg/m   Wt Readings from Last 3 Encounters:  06/29/21 225 lb 8 oz (102.3 kg)  02/23/21 229 lb 12.8 oz (104.2 kg)  12/23/20 232 lb 2 oz (105.3 kg)      Physical Exam Vitals and nursing note reviewed.  Constitutional:      Appearance: Normal appearance. She is not ill-appearing.  HENT:     Head: Normocephalic and atraumatic.     Right Ear: Tympanic membrane, ear canal and external ear normal. Decreased hearing noted. There is no impacted cerumen.     Left Ear: Tympanic membrane, ear canal and  external ear normal. Decreased hearing noted. There is no impacted cerumen.     Ears:     Comments: L>>R hearing loss    Nose: Nose normal. No congestion.     Mouth/Throat:     Mouth: Mucous membranes are moist.     Pharynx: Oropharynx is clear. No oropharyngeal exudate or posterior oropharyngeal erythema.  Eyes:     Extraocular Movements: Extraocular movements intact.     Conjunctiva/sclera: Conjunctivae normal.     Pupils: Pupils are equal, round, and reactive to light.  Neck:     Vascular: No carotid bruit.  Cardiovascular:     Rate and Rhythm: Normal rate and regular rhythm.     Pulses: Normal pulses.     Heart sounds: Normal heart sounds. No murmur heard. Pulmonary:     Effort: Pulmonary effort is normal. No respiratory distress.     Breath sounds: Normal breath sounds. No wheezing, rhonchi or rales.  Musculoskeletal:     Cervical back: Normal range of motion and neck supple.     Right lower leg: No edema.     Left lower leg: No edema.  Lymphadenopathy:     Cervical: No cervical adenopathy.  Skin:    General: Skin is warm and dry.     Findings: No rash.  Neurological:     General: No focal deficit present.     Mental Status: She is alert.     Comments:  CN 2-12 intact FTN intact EOMI + Dix Hallpike R>L, no nystagmus with testing  Psychiatric:        Mood and Affect: Mood normal.        Behavior: Behavior normal.       Assessment & Plan:   Problem List Items Addressed This Visit     Urge incontinence - Primary    Anticipate urinary urge incontinence due to overactive bladder - myrbetriq effective but became unaffordable. Will trial oxybutynin '5mg'$  BID PRN, reviewing possible side effects to watch for. Update with effect.        Relevant Medications   oxybutynin (DITROPAN) 5 MG tablet   Acquired deafness of left ear    Longstanding with associated vertigo. ?meniere vs other - given progressive unilateral nature of symptoms, will refer to ENT for evaluation.  Doesn't like her hearing aides due to high feedback.        Relevant Orders   Ambulatory referral to ENT   Unsteadiness on feet   Exertional dyspnea    Appreciate cardiology care.        Vertigo    Longstanding. Seems consistent with peripheral vertigo. Non-focal neurological exam today,  no nystagmus with Dix-Hallpike. ?meniere vs other.  Considered imaging, will refer to ENT instead.  Consider vestibular rehab pending results.        Relevant Orders   Ambulatory referral to ENT     Meds ordered this encounter  Medications   oxybutynin (DITROPAN) 5 MG tablet    Sig: Take 1 tablet (5 mg total) by mouth 2 (two) times daily as needed for bladder spasms.    Dispense:  40 tablet    Refill:  6   Orders Placed This Encounter  Procedures   Ambulatory referral to ENT    Referral Priority:   Routine    Referral Type:   Consultation    Referral Reason:   Specialty Services Required    Requested Specialty:   Otolaryngology    Number of Visits Requested:   1     Patient Instructions  We will try oxybutynin '5mg'$  nightly for bladder. May take 2nd dose during the day as needed.  I will refer you to ENT in [facility] (ear doctor) for evaluation of left sided hearing loss + vertigo.  Good to see you today Return in 6 months for physical/wellness visit   Follow up plan: Return in about 6 months (around 12/29/2021) for annual exam, prior fasting for blood work, medicare wellness visit.  Ria Bush, MD

## 2021-06-30 DIAGNOSIS — R42 Dizziness and giddiness: Secondary | ICD-10-CM | POA: Insufficient documentation

## 2021-06-30 NOTE — Assessment & Plan Note (Addendum)
Longstanding. Seems consistent with peripheral vertigo. Non-focal neurological exam today, no nystagmus with Dix-Hallpike. ?meniere vs other.  Considered imaging, will refer to ENT instead.  Consider vestibular rehab pending results.

## 2021-06-30 NOTE — Assessment & Plan Note (Addendum)
Longstanding with associated vertigo. ?meniere vs other - given progressive unilateral nature of symptoms, will refer to ENT for evaluation. Doesn't like her hearing aides due to high feedback.

## 2021-06-30 NOTE — Assessment & Plan Note (Signed)
Anticipate urinary urge incontinence due to overactive bladder - myrbetriq effective but became unaffordable. Will trial oxybutynin '5mg'$  BID PRN, reviewing possible side effects to watch for. Update with effect.

## 2021-06-30 NOTE — Assessment & Plan Note (Signed)
Appreciate cardiology care.  °

## 2021-07-31 ENCOUNTER — Encounter: Payer: Self-pay | Admitting: Family Medicine

## 2021-07-31 ENCOUNTER — Telehealth: Payer: Self-pay | Admitting: Family Medicine

## 2021-07-31 DIAGNOSIS — H903 Sensorineural hearing loss, bilateral: Secondary | ICD-10-CM | POA: Insufficient documentation

## 2021-07-31 NOTE — Telephone Encounter (Signed)
Pt was referred to [facility] ENT Dr Benjamine Mola last month.  Plz see if pt ended up going? If so, plz request office note.  Thanks.

## 2021-07-31 NOTE — Telephone Encounter (Signed)
Lvm asking pt to call back.  Need to see if pt was seen at ENT last month.

## 2021-08-03 NOTE — Telephone Encounter (Signed)
Lvm asking pt to call back.  Need to see if pt was seen at ENT last month.

## 2021-08-04 NOTE — Telephone Encounter (Signed)
Spoke with pt asking if she was seen at [facility] ENT last month.  States she didn't know anything about it but she's been having cell phn issues.  I gave her their phn # [phone].  Says she will call to see if they have tried to reach her to get scheduled. Fyi to Dr. Darnell Level.

## 2021-11-12 DIAGNOSIS — H2513 Age-related nuclear cataract, bilateral: Secondary | ICD-10-CM | POA: Diagnosis not present

## 2021-11-12 DIAGNOSIS — H25033 Anterior subcapsular polar age-related cataract, bilateral: Secondary | ICD-10-CM | POA: Diagnosis not present

## 2021-11-12 DIAGNOSIS — H25013 Cortical age-related cataract, bilateral: Secondary | ICD-10-CM | POA: Diagnosis not present

## 2021-11-12 DIAGNOSIS — H21233 Degeneration of iris (pigmentary), bilateral: Secondary | ICD-10-CM | POA: Diagnosis not present

## 2021-11-12 DIAGNOSIS — E119 Type 2 diabetes mellitus without complications: Secondary | ICD-10-CM | POA: Diagnosis not present

## 2021-11-12 DIAGNOSIS — H5203 Hypermetropia, bilateral: Secondary | ICD-10-CM | POA: Diagnosis not present

## 2021-11-12 DIAGNOSIS — H524 Presbyopia: Secondary | ICD-10-CM | POA: Diagnosis not present

## 2021-11-12 LAB — HM DIABETES EYE EXAM

## 2021-11-13 ENCOUNTER — Encounter: Payer: Self-pay | Admitting: Family Medicine

## 2021-11-13 ENCOUNTER — Telehealth: Payer: Self-pay | Admitting: Family Medicine

## 2021-11-13 NOTE — Telephone Encounter (Signed)
Patient returned a call to [place], I did not see a note where she was contacted. She would like a return call.

## 2021-11-16 NOTE — Telephone Encounter (Signed)
Spoke with pt asking what my message was in reference to, since the last call I made to her was in 07/2021.  Pt wanted to let us know she scheduled ENT appt.

## 2021-11-30 DIAGNOSIS — H903 Sensorineural hearing loss, bilateral: Secondary | ICD-10-CM | POA: Diagnosis not present

## 2021-12-10 ENCOUNTER — Telehealth: Payer: Self-pay | Admitting: Family Medicine

## 2021-12-10 DIAGNOSIS — E039 Hypothyroidism, unspecified: Secondary | ICD-10-CM

## 2021-12-10 DIAGNOSIS — K219 Gastro-esophageal reflux disease without esophagitis: Secondary | ICD-10-CM

## 2021-12-10 DIAGNOSIS — E785 Hyperlipidemia, unspecified: Secondary | ICD-10-CM

## 2021-12-11 NOTE — Telephone Encounter (Signed)
E-scribed refills.  Plz schedule CPE and lab visits to prevent delays in future refills.

## 2021-12-11 NOTE — Telephone Encounter (Signed)
LVM for patient to call back and schedule

## 2021-12-11 NOTE — Telephone Encounter (Signed)
Noted  

## 2021-12-24 ENCOUNTER — Ambulatory Visit (INDEPENDENT_AMBULATORY_CARE_PROVIDER_SITE_OTHER): Payer: Medicare PPO

## 2021-12-24 VITALS — Ht 68.0 in | Wt 225.0 lb

## 2021-12-24 DIAGNOSIS — Z0000 Encounter for general adult medical examination without abnormal findings: Secondary | ICD-10-CM

## 2021-12-24 DIAGNOSIS — Z780 Asymptomatic menopausal state: Secondary | ICD-10-CM | POA: Diagnosis not present

## 2021-12-24 NOTE — Patient Instructions (Signed)
Lisa Osborne , Thank you for taking time to come for your Medicare Wellness Visit. I appreciate your ongoing commitment to your health goals. Please review the following plan we discussed and let me know if I can assist you in the future.   Screening recommendations/referrals: Colonoscopy: 09/26/20, aged out Mammogram: 04/07/21, aged out Bone Density: referral sent Recommended yearly ophthalmology/optometry visit for glaucoma screening and checkup Recommended yearly dental visit for hygiene and checkup  Vaccinations: Influenza vaccine: 10/29/20 Pneumococcal vaccine: 05/22/13 Tdap vaccine: 08/22/08, due if have injury Shingles vaccine: Shingrix 02/10/21, 04/15/21   Covid-19:03/01/19, 03/26/19, 01/13/21, 10/27/21  Advanced directives: yes  Conditions/risks identified: none  Next appointment: Follow up in one year for your annual wellness visit 12/27/22 @ 1:45 pm by phone   Preventive Care 80 Years and Older, Female Preventive care refers to lifestyle choices and visits with your health care provider that can promote health and wellness. What does preventive care include? A yearly physical exam. This is also called an annual well check. Dental exams once or twice a year. Routine eye exams. Ask your health care provider how often you should have your eyes checked. Personal lifestyle choices, including: Daily care of your teeth and gums. Regular physical activity. Eating a healthy diet. Avoiding tobacco and drug use. Limiting alcohol use. Practicing safe sex. Taking low-dose aspirin every day. Taking vitamin and mineral supplements as recommended by your health care provider. What happens during an annual well check? The services and screenings done by your health care provider during your annual well check will depend on your age, overall health, lifestyle risk factors, and family history of disease. Counseling  Your health care provider may ask you questions about your: Alcohol  use. Tobacco use. Drug use. Emotional well-being. Home and relationship well-being. Sexual activity. Eating habits. History of falls. Memory and ability to understand (cognition). Work and work Statistician. Reproductive health. Screening  You may have the following tests or measurements: Height, weight, and BMI. Blood pressure. Lipid and cholesterol levels. These may be checked every 5 years, or more frequently if you are over 55 years old. Skin check. Lung cancer screening. You may have this screening every year starting at age 80 if you have a 30-pack-year history of smoking and currently smoke or have quit within the past 15 years. Fecal occult blood test (FOBT) of the stool. You may have this test every year starting at age 80. Flexible sigmoidoscopy or colonoscopy. You may have a sigmoidoscopy every 5 years or a colonoscopy every 10 years starting at age 80. Hepatitis C blood test. Hepatitis B blood test. Sexually transmitted disease (STD) testing. Diabetes screening. This is done by checking your blood sugar (glucose) after you have not eaten for a while (fasting). You may have this done every 1-3 years. Bone density scan. This is done to screen for osteoporosis. You may have this done starting at age 80. Mammogram. This may be done every 1-2 years. Talk to your health care provider about how often you should have regular mammograms. Talk with your health care provider about your test results, treatment options, and if necessary, the need for more tests. Vaccines  Your health care provider may recommend certain vaccines, such as: Influenza vaccine. This is recommended every year. Tetanus, diphtheria, and acellular pertussis (Tdap, Td) vaccine. You may need a Td booster every 10 years. Zoster vaccine. You may need this after age 80. Pneumococcal 13-valent conjugate (PCV13) vaccine. One dose is recommended after age 80. Pneumococcal polysaccharide (PPSV23) vaccine.  One dose is  recommended after age 80. Talk to your health care provider about which screenings and vaccines you need and how often you need them. This information is not intended to replace advice given to you by your health care provider. Make sure you discuss any questions you have with your health care provider. Document Released: 02/07/2015 Document Revised: 10/01/2015 Document Reviewed: 11/12/2014 Elsevier Interactive Patient Education  2017 [employer] Prevention in the Home Falls can cause injuries. They can happen to people of all ages. There are many things you can do to make your home safe and to help prevent falls. What can I do on the outside of my home? Regularly fix the edges of walkways and driveways and fix any cracks. Remove anything that might make you trip as you walk through a door, such as a raised step or threshold. Trim any bushes or trees on the path to your home. Use bright outdoor lighting. Clear any walking paths of anything that might make someone trip, such as rocks or tools. Regularly check to see if handrails are loose or broken. Make sure that both sides of any steps have handrails. Any raised decks and porches should have guardrails on the edges. Have any leaves, snow, or ice cleared regularly. Use sand or salt on walking paths during winter. Clean up any spills in your garage right away. This includes oil or grease spills. What can I do in the bathroom? Use night lights. Install grab bars by the toilet and in the tub and shower. Do not use towel bars as grab bars. Use non-skid mats or decals in the tub or shower. If you need to sit down in the shower, use a plastic, non-slip stool. Keep the floor dry. Clean up any water that spills on the floor as soon as it happens. Remove soap buildup in the tub or shower regularly. Attach bath mats securely with double-sided non-slip rug tape. Do not have throw rugs and other things on the floor that can make you  trip. What can I do in the bedroom? Use night lights. Make sure that you have a light by your bed that is easy to reach. Do not use any sheets or blankets that are too big for your bed. They should not hang down onto the floor. Have a firm chair that has side arms. You can use this for support while you get dressed. Do not have throw rugs and other things on the floor that can make you trip. What can I do in the kitchen? Clean up any spills right away. Avoid walking on wet floors. Keep items that you use a lot in easy-to-reach places. If you need to reach something above you, use a strong step stool that has a grab bar. Keep electrical cords out of the way. Do not use floor polish or wax that makes floors slippery. If you must use wax, use non-skid floor wax. Do not have throw rugs and other things on the floor that can make you trip. What can I do with my stairs? Do not leave any items on the stairs. Make sure that there are handrails on both sides of the stairs and use them. Fix handrails that are broken or loose. Make sure that handrails are as long as the stairways. Check any carpeting to make sure that it is firmly attached to the stairs. Fix any carpet that is loose or worn. Avoid having throw rugs at the top or bottom of  the stairs. If you do have throw rugs, attach them to the floor with carpet tape. Make sure that you have a light switch at the top of the stairs and the bottom of the stairs. If you do not have them, ask someone to add them for you. What else can I do to help prevent falls? Wear shoes that: Do not have high heels. Have rubber bottoms. Are comfortable and fit you well. Are closed at the toe. Do not wear sandals. If you use a stepladder: Make sure that it is fully opened. Do not climb a closed stepladder. Make sure that both sides of the stepladder are locked into place. Ask someone to hold it for you, if possible. Clearly mark and make sure that you can  see: Any grab bars or handrails. First and last steps. Where the edge of each step is. Use tools that help you move around (mobility aids) if they are needed. These include: Canes. Walkers. Scooters. Crutches. Turn on the lights when you go into a dark area. Replace any light bulbs as soon as they burn out. Set up your furniture so you have a clear path. Avoid moving your furniture around. If any of your floors are uneven, fix them. If there are any pets around you, be aware of where they are. Review your medicines with your doctor. Some medicines can make you feel dizzy. This can increase your chance of falling. Ask your doctor what other things that you can do to help prevent falls. This information is not intended to replace advice given to you by your health care provider. Make sure you discuss any questions you have with your health care provider. Document Released: 11/07/2008 Document Revised: 06/19/2015 Document Reviewed: 02/15/2014 Elsevier Interactive Patient Education  2017 [employer].

## 2021-12-24 NOTE — Progress Notes (Signed)
Virtual Visit via Telephone Note  I connected with  Lisa Osborne on 12/24/21 at  2:30 PM EST by telephone and verified that I am speaking with the correct person using two identifiers.  Location: Patient: home Provider: [place] Persons participating in the virtual visit: [place]   I discussed the limitations, risks, security and privacy concerns of performing an evaluation and management service by telephone and the availability of in person appointments. The patient expressed understanding and agreed to proceed.  Interactive audio and video telecommunications were attempted between this nurse and patient, however failed, due to patient having technical difficulties OR patient did not have access to video capability.  We continued and completed visit with audio only.  Some vital signs may be absent or patient reported.   Dionisio David, LPN  Subjective:   Lisa Osborne is a 81 y.o. female who presents for Medicare Annual (Subsequent) preventive examination.  Review of Systems     Cardiac Risk Factors include: advanced age (>46mn, >>58women);diabetes mellitus;dyslipidemia     Objective:    There were no vitals filed for this visit. There is no height or weight on file to calculate BMI.     12/24/2021    2:32 PM 08/21/2018   12:04 PM 08/19/2017   12:30 PM 06/17/2016   11:11 AM 03/12/2016   10:17 AM 08/11/2015   10:53 AM 07/25/2015   10:37 AM  Advanced Directives  Does Patient Have a Medical Advance Directive? _0  Yes Yes  Type of AParamed[place]of AP[place]Living will HSoddy-DaisyLiving will HR[place]yLiving will HWyevilleLiving will HNinety SixLiving will  HSevernLiving will  Does patient want to make changes to medical advance directive? No - Patient declined      No - Patient declined  Copy of  [place]in Chart? Yes - validated most recent copy scanned in chart (See row information) Yes - validated most recent copy scanned in chart (See row information) Yes No - copy requested No - copy requested No - copy requested No - copy requested    Current Medications (verified) Outpatient Encounter Medications as of 12/24/2021  Medication Sig   atorvastatin (LIPITOR) 10 MG tablet TAKE 1 TABLET BY MOUTH EVERY DAY   Calcium Citrate-Vitamin D (CALCIUM CITRATE + D PO) Take 1 tablet by mouth daily.    Coenzyme Q10 (COQ10) 200 MG CAPS Take 1 capsule by mouth daily.    CRANBERRY PO Take by mouth.   Ferrous Sulfate (IRON) 325 (65 Fe) MG TABS Take by mouth daily.   lansoprazole (PREVACID) 15 MG capsule TAKE 1 CAPSULE BY MOUTH EVERY DAY   levothyroxine (SYNTHROID) 25 MCG tablet TAKE 1 TABLET BY MOUTH EVERY DAY   loratadine (CLARITIN) 10 MG tablet Take 10 mg by mouth daily as needed for allergies. Reported on 03/17/2015   Melatonin 5 MG TABS Take 5 mg by mouth at bedtime.    Omega-3 Fatty Acids (FISH OIL) 1000 MG CAPS Take 1 capsule by mouth daily.   oxybutynin (DITROPAN) 5 MG tablet Take 1 tablet (5 mg total) by mouth 2 (two) times daily as needed for bladder spasms.   sertraline (ZOLOFT) 100 MG tablet Take 1 tablet (100 mg total) by mouth daily.   vitamin B-12 (CYANOCOBALAMIN) 1000 MCG tablet Take 1,000 mcg by mouth daily.   APPLE CIDER VINEGAR PO Take 1 capsule by  mouth daily. (Patient not taking: Reported on 12/24/2021)   No facility-administered encounter medications on file as of 12/24/2021.    Allergies (verified) Latex and Nickel   History: Past Medical History:  Diagnosis Date   Abnormal EKG    incomplete RBBB   Acquired deafness of left ear    Acute cholecystitis    Allergy    Anemia    Anxiety    Arthritis    left side lower back pain   Brown recluse spider bite 03/2018   Colon adenocarcinoma - rectosigmoid T3N1 (1/15) s/p lap LAR 04/14/2012   Status post  laparoscopic lower anterior resection   Dehydration 02/2015   Depression    severe in past   Diabetes mellitus type 2, controlled ([place]) 2006   diet controlled   GERD (gastroesophageal reflux disease)    bad if off PPI   History of chicken pox    History of iron deficiency anemia    HLD (hyperlipidemia) 08/03/2015   Hypothyroidism    mild   Laceration of fifth toe, right 08/26/2017   Neuromuscular disorder (HCC)    neuropathy both great toes   Personal history of chemotherapy    Rosacea    Seasonal allergies    Unilateral deafnesses    left ear deaf, wears hearing aid right side   Urine incontinence    mild, wears pad   Past Surgical History:  Procedure Laterality Date   ABDOMINOPLASTY  1996   CHOLECYSTECTOMY N/A 03/20/2015   Procedure: LAPAROSCOPIC CHOLECYSTECTOMY;  Surgeon: Florene Glen, MD;  Location: [facility] ORS;  Service: General;  Laterality: N/A;   COLONOSCOPY  08/2006   normal   COLONOSCOPY  03/2013   2 polyps, mild diverticulosis, rpt 3 yrs [name])   COLONOSCOPY  03/2016   5 TAs, fair prep, patent anastomosis, rpt 6-12 mo ([place])   COLONOSCOPY  01/2017   diverticulosis, healthy anastomosis, rpt 5 yrs ([place])   COLONOSCOPY  09/2020   TA x10, int hem, consider rpt 3 yrs ([place])   FACELIFT  05/2011   INCONTINENCE SURGERY  1996   LAPAROSCOPIC LOW ANTERIOR RESECTION N/A 04/14/2012   colon cancer at rectosigmoid junction s/p rigid proctoscopy splenic flexure mobilization lysis of adhesion;  Surgeon: Adin Hector, MD;    MOUTH SURGERY     crown repair   TOTAL VAGINAL HYSTERECTOMY  1985   cancer cells on cervix   Family History  Problem Relation Age of Onset   Breast cancer Mother 2       breast cancer then multiple myeloma   Alcohol abuse Father    Heart disease Father        heart failure   Mental illness Brother        2 brothers severe depression, suicide x2   Diabetes Maternal Aunt    Diabetes Maternal Uncle    Diabetes Paternal  Uncle    Diabetes Paternal Grandfather    Coronary artery disease Neg Hx    Stroke Neg Hx    Colon cancer Neg Hx    Esophageal cancer Neg Hx    Rectal cancer Neg Hx    Stomach cancer Neg Hx    Liver disease Neg Hx    Pancreatic cancer Neg Hx    Prostate cancer Neg Hx    Colon polyps Neg Hx    Social History   Socioeconomic History   Marital status: Single    Spouse name: Not on file   Number of children: 1  Years of education: Not on file   Highest education level: Not on file  Occupational History   Occupation: Retired Product manager: RETIRED  Tobacco Use   Smoking status: Former    Types: Cigarettes    Quit date: 04/06/1967    Years since quitting: 54.7   Smokeless tobacco: Never  Vaping Use   Vaping Use: Never used  Substance and Sexual Activity   Alcohol use: Yes    Alcohol/week: 1.0 standard drink of alcohol    Types: 1 Shots of liquor per week    Comment: 1 per week   Drug use: No   Sexual activity: Not Currently  Other Topics Concern   Not on file  Social History Narrative   Caffeine: 1 cup coffee/day   Lives alone.  1 daughter [name]), 3 stillborns   Occupation: retired, was Chief Technology Officer.   Activity: no regular activity but does park far away, has treadmill at home   Diet: good fruits/vegetables, more fruit/vegetable smoothies  good amt water   Social Determinants of Health   Financial Resource Strain: Low Risk  (12/24/2021)   Overall Financial Resource Strain (CARDIA)    Difficulty of Paying Living Expenses: Not hard at all  Food Insecurity: No Food Insecurity (12/24/2021)   Hunger Vital Sign    Worried About Running Out of Food in the Last Year: Never true    Ran Out of Food in the Last Year: Never true  Transportation Needs: No Transportation Needs (12/24/2021)   PRAPARE - Hydrologist (Medical): No    Lack of Transportation (Non-Medical): No  Physical Activity: Insufficiently Active (12/24/2021)   Exercise  Vital Sign    Days of Exercise per Week: 2 days    Minutes of Exercise per Session: 30 min  Stress: No Stress Concern Present (12/24/2021)   [place]    Feeling of Stress : Only a little  Social Connections: Socially Isolated (12/24/2021)   Social Connection and Isolation Panel [NHANES]    Frequency of Communication with Friends and Family: More than three times a week    Frequency of Social Gatherings with Friends and Family: Once a week    Attends Religious Services: Never    Marine scientist or Organizations: No    Attends Music therapist: Never    Marital Status: Divorced    Tobacco Counseling Counseling given: Not Answered   Clinical Intake:  Pre-visit preparation completed: Yes  Pain : No/denies pain     Nutritional Risks: None Diabetes: No  How often do you need to have someone help you when you read instructions, pamphlets, or other written materials from your doctor or pharmacy?: 1 - Never  Diabetic?yes Nutrition Risk Assessment:  Has the patient had any N/V/D within the last 2 months?  No  Does the patient have any non-healing wounds?  No  Has the patient had any unintentional weight loss or weight gain?  No   Diabetes:  Is the patient diabetic?  Yes  If diabetic, was a CBG obtained today?  No  Did the patient bring in their glucometer from home?  No  How often do you monitor your CBG's? Once per week.   Financial Strains and Diabetes Management:  Are you having any financial strains with the device, your supplies or your medication? No .  Does the patient want to be seen by Chronic Care Management for management of  their diabetes?  No  Would the patient like to be referred to a Nutritionist or for Diabetic Management?  No   Diabetic Exams:  Diabetic Eye Exam: Completed 11/12/21.  Pt has been advised about the importance in completing this exam.  Diabetic Foot  Exam: Completed 05/22/13. Pt has been advised about the importance in completing this exam.    Interpreter Needed?: No  Information entered by :: Kirke Shaggy, LPN   Activities of Daily Living    12/24/2021    2:34 PM  In your present state of health, do you have any difficulty performing the following activities:  Hearing? 1  Vision? 0  Difficulty concentrating or making decisions? 0  Walking or climbing stairs? 1  Dressing or bathing? 0  Doing errands, shopping? 0  Preparing Food and eating ? N  Using the Toilet? N  In the past six months, have you accidently leaked urine? N  Do you have problems with loss of bowel control? N  Managing your Medications? N  Managing your Finances? N  Housekeeping or managing your Housekeeping? N    Patient Care Team: Ria Bush, MD as PCP - General (Family Medicine) Josue Hector, MD as PCP - Cardiology (Cardiology) Michael Boston, MD as Consulting Physician (General Surgery) Inda Castle, MD (Inactive) as Consulting Physician (Gastroenterology) Carola Frost, RN as Registered Nurse (Oncology) Ladell Pier, MD as Attending Physician (Medical Oncology) Thelma Comp, [place] as Referring Physician (Optometry) Owens Shark, NP as Nurse Practitioner (Hematology and Oncology) Ladell Pier, MD as Consulting Physician (Oncology)  Indicate any recent Medical Services you may have received from other than [facility] providers in the past year (date may be approximate).     Assessment:   This is a routine wellness examination for Lisa Osborne.  Hearing/Vision screen Hearing Screening - Comments:: Wears aids Vision Screening - Comments:: Readers- Dr.Bulakowski  Dietary issues and exercise activities discussed: Current Exercise Habits: Home exercise routine, Type of exercise: walking, Time (Minutes): 30, Frequency (Times/Week): 2, Weekly Exercise (Minutes/Week): 60, Intensity: Mild   Goals Addressed             This Visit's  Progress    DIET - EAT MORE FRUITS AND VEGETABLES         Depression Screen    12/24/2021    2:31 PM 12/23/2020    2:56 PM 09/04/2019   12:53 PM 08/28/2018    2:32 PM 08/21/2018   12:05 PM 08/19/2017   12:28 PM 06/17/2016   11:10 AM  PHQ 2/9 Scores  PHQ - 2 Score 0 0 0 0 0 0 0  PHQ- 9 Score 0 _0 0 0     Fall Risk    12/24/2021    2:33 PM 12/23/2020    2:04 PM 09/04/2019   12:07 PM 08/28/2018    2:21 PM 08/21/2018   12:05 PM  Fall Risk   Falls in the past year? 1 0 1 0 0  Number falls in past yr: 0  1    Injury with Fall? 0  1    Comment   Concussion    Risk for fall due to : History of fall(s)    Medication side effect  Follow up Falls prevention discussed;Falls evaluation completed    Falls evaluation completed;Falls prevention discussed    FALL RISK PREVENTION PERTAINING TO THE HOME:  Any stairs in or around the home? No  If so, are there any without handrails? No  Home  free of loose throw rugs in walkways, pet beds, electrical cords, etc? Yes  Adequate lighting in your home to reduce risk of falls? Yes   ASSISTIVE DEVICES UTILIZED TO PREVENT FALLS:  Life alert? No  Use of a cane, walker or w/c? No  Grab bars in the bathroom? Yes  Shower chair or bench in shower? No  Elevated toilet seat or a handicapped toilet? No    Cognitive Function:    08/21/2018   12:09 PM 08/19/2017   12:30 PM 06/17/2016   11:11 AM 07/25/2015   10:45 AM  MMSE - Mini Mental State Exam  Orientation to time _0 Orientation to time comments missed date     Orientation to Place _1 Registration _2 Attention/ Calculation 5 0 0 0  Recall _3 Language- name 2 objects 0 0 0 0  Language- repeat _4 Language- follow 3 step command 0 _5 Language- read & follow direction 0 0 0 0  Write a sentence 0 0 0 0  Copy design 0 0 0 0  Total score _6 12/24/2021    2:36 PM  6CIT Screen  What Year? 0 points  What month? 0 points  What time? 0 points   Count back from 20 0 points  Months in reverse 0 points  Repeat phrase 2 points  Total Score 2 points    Immunizations Immunization History  Administered Date(s) Administered   Fluad Quad(high Dose 65+) 10/05/2018, 10/27/2021   Influenza Split 11/26/2010   Influenza, High Dose Seasonal PF 10/27/2015, 11/01/2016, 10/27/2017, 10/29/2020   Influenza,inj,Quad PF,6+ Mos 11/09/2012   Influenza-Unspecified 12/26/2013   PFIZER Comirnaty(Gray Top)Covid-19 Tri-Sucrose Vaccine 10/27/2021   PFIZER(Purple Top)SARS-COV-2 Vaccination 03/01/2019, 03/26/2019   Pfizer Covid-19 Vaccine Bivalent Booster 88yr & up 01/13/2021   Pneumococcal Conjugate-13 05/22/2013   Pneumococcal Polysaccharide-23 01/07/2007   Td 08/22/2008   Zoster Recombinat (Shingrix) 02/10/2021, 04/15/2021    TDAP status: Due, Education has been provided regarding the importance of this vaccine. Advised may receive this vaccine at local pharmacy or Health Dept. Aware to provide a copy of the vaccination record if obtained from local pharmacy or Health Dept. Verbalized acceptance and understanding.  Flu Vaccine status: Up to date  Pneumococcal vaccine status: Up to date  Covid-19 vaccine status: Completed vaccines  Qualifies for Shingles Vaccine? Yes   Zostavax completed No   Shingrix Completed?: Yes  Screening Tests Health Maintenance  Topic Date Due   DTaP/Tdap/Td (2 - Tdap) 08/23/2018   COVID-19 Vaccine (5 - 2023-24 season) 12/22/2021   MAMMOGRAM  04/08/2022   Medicare Annual Wellness (AWV)  12/25/2022   COLONOSCOPY (Pts 45-452yrInsurance coverage will need to be confirmed)  09/27/2023   Pneumonia Vaccine 80Years old  Completed   INFLUENZA VACCINE  Completed   DEXA SCAN  Completed   Hepatitis C Screening  Completed   Zoster Vaccines- Shingrix  Completed   HPV VACCINES  Aged Out    Health Maintenance  Health Maintenance Due  Topic Date Due   DTaP/Tdap/Td (2 - Tdap) 08/23/2018   COVID-19 Vaccine (5 -  2023-24 season) 12/22/2021    Colorectal cancer screening: No longer required.   Mammogram status: Completed 04/07/21. Repeat every year  Bone Density status: Ordered 12/24/21. Pt provided with contact info and advised to call to schedule appt.  Lung  Cancer Screening: (Low Dose CT Chest recommended if Age 21-80 years, 30 pack-year currently smoking OR have quit w/in 15years.) does not qualify.   Additional Screening:  Hepatitis C Screening: does qualify; Completed 12/23/20  Vision Screening: Recommended annual ophthalmology exams for early detection of glaucoma and other disorders of the eye. Is the patient up to date with their annual eye exam?  Yes  Who is the provider or what is the name of the office in which the patient attends annual eye exams? Dr.Bulakowski  If pt is not established with a provider, would they like to be referred to a provider to establish care? No .   Dental Screening: Recommended annual dental exams for proper oral hygiene  Community Resource Referral / Chronic Care Management: CRR required this visit?  No   CCM required this visit?  No      Plan:     I have personally reviewed and noted the following in the patient's chart:   Medical and social history Use of alcohol, tobacco or illicit drugs  Current medications and supplements including opioid prescriptions. Patient is not currently taking opioid prescriptions. Functional ability and status Nutritional status Physical activity Advanced directives List of other physicians Hospitalizations, surgeries, and ER visits in previous 12 months Vitals Screenings to include cognitive, depression, and falls Referrals and appointments  In addition, I have reviewed and discussed with patient certain preventive protocols, quality metrics, and best practice recommendations. A written personalized care plan for preventive services as well as general preventive health recommendations were provided to  patient.     Dionisio David, LPN   30/07/6224   Nurse Notes: none

## 2022-01-25 ENCOUNTER — Other Ambulatory Visit: Payer: Self-pay | Admitting: Family Medicine

## 2022-01-25 DIAGNOSIS — R7303 Prediabetes: Secondary | ICD-10-CM

## 2022-01-25 DIAGNOSIS — E785 Hyperlipidemia, unspecified: Secondary | ICD-10-CM

## 2022-01-25 DIAGNOSIS — N183 Chronic kidney disease, stage 3 unspecified: Secondary | ICD-10-CM

## 2022-01-25 DIAGNOSIS — E039 Hypothyroidism, unspecified: Secondary | ICD-10-CM

## 2022-01-27 ENCOUNTER — Other Ambulatory Visit (INDEPENDENT_AMBULATORY_CARE_PROVIDER_SITE_OTHER): Payer: Medicare PPO

## 2022-01-27 DIAGNOSIS — E039 Hypothyroidism, unspecified: Secondary | ICD-10-CM | POA: Diagnosis not present

## 2022-01-27 DIAGNOSIS — E785 Hyperlipidemia, unspecified: Secondary | ICD-10-CM | POA: Diagnosis not present

## 2022-01-27 DIAGNOSIS — R7303 Prediabetes: Secondary | ICD-10-CM | POA: Diagnosis not present

## 2022-01-27 DIAGNOSIS — N183 Chronic kidney disease, stage 3 unspecified: Secondary | ICD-10-CM | POA: Diagnosis not present

## 2022-01-27 LAB — CBC WITH DIFFERENTIAL/PLATELET
Basophils Absolute: 0 10*3/uL (ref 0.0–0.1)
Basophils Relative: 0.1 % (ref 0.0–3.0)
Eosinophils Absolute: 0 10*3/uL (ref 0.0–0.7)
Eosinophils Relative: 0.2 % (ref 0.0–5.0)
HCT: 36.2 % (ref 36.0–46.0)
Hemoglobin: 12.3 g/dL (ref 12.0–15.0)
Lymphocytes Relative: 48.7 % — ABNORMAL HIGH (ref 12.0–46.0)
Lymphs Abs: 2.7 10*3/uL (ref 0.7–4.0)
MCHC: 34.1 g/dL (ref 30.0–36.0)
MCV: 98.3 fl (ref 78.0–100.0)
Monocytes Absolute: 0.9 10*3/uL (ref 0.1–1.0)
Monocytes Relative: 16.9 % — ABNORMAL HIGH (ref 3.0–12.0)
Neutro Abs: 1.9 10*3/uL (ref 1.4–7.7)
Neutrophils Relative %: 34.1 % — ABNORMAL LOW (ref 43.0–77.0)
Platelets: 158 10*3/uL (ref 150.0–400.0)
RBC: 3.69 Mil/uL — ABNORMAL LOW (ref 3.87–5.11)
RDW: 12.9 % (ref 11.5–15.5)
WBC: 5.5 10*3/uL (ref 4.0–10.5)

## 2022-01-27 LAB — MICROALBUMIN / CREATININE URINE RATIO
Creatinine,U: 212.2 mg/dL
Microalb Creat Ratio: 2.9 mg/g (ref 0.0–30.0)
Microalb, Ur: 6.2 mg/dL — ABNORMAL HIGH (ref 0.0–1.9)

## 2022-01-27 LAB — COMPREHENSIVE METABOLIC PANEL
ALT: 9 U/L (ref 0–35)
AST: 18 U/L (ref 0–37)
Albumin: 4.5 g/dL (ref 3.5–5.2)
Alkaline Phosphatase: 60 U/L (ref 39–117)
BUN: 20 mg/dL (ref 6–23)
CO2: 26 mEq/L (ref 19–32)
Calcium: 9.1 mg/dL (ref 8.4–10.5)
Chloride: 103 mEq/L (ref 96–112)
Creatinine, Ser: 1.05 mg/dL (ref 0.40–1.20)
GFR: 50.34 mL/min — ABNORMAL LOW (ref >60.00)
Glucose, Bld: 129 mg/dL — ABNORMAL HIGH (ref 70–99)
Potassium: 3.9 mEq/L (ref 3.5–5.1)
Sodium: 141 mEq/L (ref 135–145)
Total Bilirubin: 0.4 mg/dL (ref 0.2–1.2)
Total Protein: 7.2 g/dL (ref 6.0–8.3)

## 2022-01-27 LAB — LIPID PANEL
Cholesterol: 177 mg/dL (ref 0–200)
HDL: 56.8 mg/dL (ref >39.00)
LDL Cholesterol: 84 mg/dL (ref 0–99)
NonHDL: 120.24
Total CHOL/HDL Ratio: 3
Triglycerides: 180 mg/dL — ABNORMAL HIGH (ref 0.0–149.0)
VLDL: 36 mg/dL (ref 0.0–40.0)

## 2022-01-27 LAB — HEMOGLOBIN A1C: Hgb A1c MFr Bld: 6.3 % (ref 4.6–6.5)

## 2022-01-27 LAB — VITAMIN D 25 HYDROXY (VIT D DEFICIENCY, FRACTURES): VITD: 64.4 ng/mL (ref 30.00–100.00)

## 2022-01-27 LAB — TSH: TSH: 3.39 u[IU]/mL (ref 0.35–5.50)

## 2022-01-28 LAB — PARATHYROID HORMONE, INTACT (NO CA): PTH: 139 pg/mL — ABNORMAL HIGH (ref 16–77)

## 2022-02-03 ENCOUNTER — Encounter: Payer: Medicare PPO | Admitting: Family Medicine

## 2022-02-20 ENCOUNTER — Other Ambulatory Visit: Payer: Self-pay | Admitting: Family Medicine

## 2022-02-20 DIAGNOSIS — F331 Major depressive disorder, recurrent, moderate: Secondary | ICD-10-CM

## 2022-03-09 ENCOUNTER — Other Ambulatory Visit: Payer: Self-pay | Admitting: Family Medicine

## 2022-03-09 DIAGNOSIS — Z1231 Encounter for screening mammogram for malignant neoplasm of breast: Secondary | ICD-10-CM

## 2022-03-21 ENCOUNTER — Other Ambulatory Visit: Payer: Self-pay | Admitting: Family Medicine

## 2022-03-21 DIAGNOSIS — F331 Major depressive disorder, recurrent, moderate: Secondary | ICD-10-CM

## 2022-03-21 DIAGNOSIS — E039 Hypothyroidism, unspecified: Secondary | ICD-10-CM

## 2022-03-21 DIAGNOSIS — E785 Hyperlipidemia, unspecified: Secondary | ICD-10-CM

## 2022-03-21 DIAGNOSIS — R7303 Prediabetes: Secondary | ICD-10-CM

## 2022-03-21 DIAGNOSIS — N183 Chronic kidney disease, stage 3 unspecified: Secondary | ICD-10-CM

## 2022-03-21 DIAGNOSIS — C189 Malignant neoplasm of colon, unspecified: Secondary | ICD-10-CM

## 2022-03-21 DIAGNOSIS — N3941 Urge incontinence: Secondary | ICD-10-CM

## 2022-03-22 ENCOUNTER — Telehealth: Payer: Self-pay

## 2022-03-22 NOTE — Telephone Encounter (Signed)
I left patient a vmail that her labs only appt on 03/23/22 has been cancelled, per Dr Darnell Level.  She had lab work done in 01/2022 and does not need any new labs.  She still has an appt on 03/30/22 for her physical.

## 2022-03-23 ENCOUNTER — Other Ambulatory Visit: Payer: Medicare PPO

## 2022-03-30 ENCOUNTER — Encounter: Payer: Self-pay | Admitting: Family Medicine

## 2022-03-30 ENCOUNTER — Ambulatory Visit (INDEPENDENT_AMBULATORY_CARE_PROVIDER_SITE_OTHER): Payer: Medicare PPO | Admitting: Family Medicine

## 2022-03-30 VITALS — BP 130/70 | HR 64 | Temp 97.3°F | Ht 67.5 in | Wt 220.0 lb

## 2022-03-30 DIAGNOSIS — I499 Cardiac arrhythmia, unspecified: Secondary | ICD-10-CM | POA: Diagnosis not present

## 2022-03-30 DIAGNOSIS — H9192 Unspecified hearing loss, left ear: Secondary | ICD-10-CM

## 2022-03-30 DIAGNOSIS — N1831 Chronic kidney disease, stage 3a: Secondary | ICD-10-CM

## 2022-03-30 DIAGNOSIS — I700 Atherosclerosis of aorta: Secondary | ICD-10-CM

## 2022-03-30 DIAGNOSIS — L989 Disorder of the skin and subcutaneous tissue, unspecified: Secondary | ICD-10-CM | POA: Diagnosis not present

## 2022-03-30 DIAGNOSIS — R42 Dizziness and giddiness: Secondary | ICD-10-CM | POA: Diagnosis not present

## 2022-03-30 DIAGNOSIS — E785 Hyperlipidemia, unspecified: Secondary | ICD-10-CM

## 2022-03-30 DIAGNOSIS — R0609 Other forms of dyspnea: Secondary | ICD-10-CM

## 2022-03-30 DIAGNOSIS — Z0001 Encounter for general adult medical examination with abnormal findings: Secondary | ICD-10-CM | POA: Diagnosis not present

## 2022-03-30 DIAGNOSIS — Z7189 Other specified counseling: Secondary | ICD-10-CM

## 2022-03-30 DIAGNOSIS — E039 Hypothyroidism, unspecified: Secondary | ICD-10-CM

## 2022-03-30 DIAGNOSIS — F331 Major depressive disorder, recurrent, moderate: Secondary | ICD-10-CM | POA: Diagnosis not present

## 2022-03-30 DIAGNOSIS — C189 Malignant neoplasm of colon, unspecified: Secondary | ICD-10-CM

## 2022-03-30 DIAGNOSIS — H903 Sensorineural hearing loss, bilateral: Secondary | ICD-10-CM | POA: Diagnosis not present

## 2022-03-30 DIAGNOSIS — N3941 Urge incontinence: Secondary | ICD-10-CM

## 2022-03-30 DIAGNOSIS — R7303 Prediabetes: Secondary | ICD-10-CM

## 2022-03-30 DIAGNOSIS — K219 Gastro-esophageal reflux disease without esophagitis: Secondary | ICD-10-CM

## 2022-03-30 MED ORDER — OXYBUTYNIN CHLORIDE 5 MG PO TABS: 5.0000 mg | ORAL_TABLET | Freq: Two times a day (BID) | ORAL | 3 refills | Status: DC | PRN

## 2022-03-30 MED ORDER — SERTRALINE HCL 100 MG PO TABS: 100.0000 mg | ORAL_TABLET | Freq: Every day | ORAL | 4 refills | Status: DC

## 2022-03-30 MED ORDER — LEVOTHYROXINE SODIUM 25 MCG PO TABS: 25.0000 ug | ORAL_TABLET | Freq: Every day | ORAL | 4 refills | Status: DC

## 2022-03-30 MED ORDER — ATORVASTATIN CALCIUM 10 MG PO TABS: 10.0000 mg | ORAL_TABLET | Freq: Every day | ORAL | 4 refills | Status: DC

## 2022-03-30 MED ORDER — LANSOPRAZOLE 15 MG PO CPDR: 15.0000 mg | DELAYED_RELEASE_CAPSULE | Freq: Every day | ORAL | 4 refills | Status: DC

## 2022-03-30 NOTE — Patient Instructions (Addendum)
For right sided vertigo - try repositioning maneuvers provided today. If this doesn't help, let me know for referral to vestibular rehab (physical therapy).  Medicines refilled today.  Spots on legs look ok Repeat EKG today.  We will request records from [facility] 11/2021 - for ENT office visit.

## 2022-03-30 NOTE — Assessment & Plan Note (Signed)
Preventative protocols reviewed and updated unless pt declined. Discussed healthy diet and lifestyle.  

## 2022-03-30 NOTE — Progress Notes (Unsigned)
Patient ID: Lisa Osborne, female    DOB: September 04, 1940, 82 y.o.   MRN: [id number]  This visit was conducted in person.  BP 130/70   Pulse 64   Temp (!) 97.3 F (36.3 C) (Temporal)   Ht 5' 7.5" (1.715 m)   Wt 220 lb (99.8 kg)   SpO2 96%   BMI 33.95 kg/m    CC: CPE Subjective:   HPI: Lisa Osborne is a 82 y.o. female presenting on 03/30/2022 for Medicare Wellness   Saw health advisor 11/2021 for medicare wellness visit. Note reviewed.   Hearing Screening   '500Hz'$  '1000Hz'$  '2000Hz'$  '4000Hz'$   Right ear 40 40 40 40  Left ear      Comments: Wears R hearing aid. Not wearing at today's OV. Pt totally deaf in L ear.   Vision Screening - Comments:: Last eye exam, 07/2021.  [place] Office Visit from 03/30/2022 in [place] at [place]  PHQ-2 Total Score 1          03/30/2022    3:03 PM 12/24/2021    2:33 PM 12/23/2020    2:04 PM 09/04/2019   12:07 PM 08/28/2018    2:21 PM  [place] in the past year? 0 1 0 1 0  Number falls in past yr:  0  1   Injury with Fall?  0  1   Comment    Concussion   Risk for fall due to :  History of fall(s)     Follow up  Falls prevention discussed;Falls evaluation completed      Progressive exertional dyspnea noted last year - s/p reassuring cardiology evaluation with echocardiogram (aortic sclerosis without stenosis) and PFTs.   Notes progressive room spinning dizziness worse when turning to the right. This has been ongoing for "quite a while" several years, worsening over the past 3-4 months.   New hard spots to bilateral legs present over the past year. Not painful, itchy, not changing.   Chronic L ear hearing loss since age 65s.  Saw audiologist for decreased hearing.  Saw ENT who recommended head imaging but she has not yet completed.  She has R sided hearing aide but doesn't wear this due to high feedback.   Preventative: Colon cancer s/p lap LAR 03/2012 - clinical remission followed by Dr  Benay Spice ONC. COLONOSCOPY Date: 03/2013 2 polyps, mild diverticulosis, rpt 3 yrs [name]) COLONOSCOPY 03/2016 5 TAs, fair prep, patent anastomosis, rpt 6-12 mo ([facility]) COLONOSCOPY 01/2017 diverticulosis, healthy anastomosis, rpt 5 yrs ([facility])  Colonoscopy 09/2020 - TA x10, int hem, consider rpt 3 yrs ([facility])  Well woman - s/p total hysterectomy 1985, ovaries removed as well.  Mammogram 03/2021 - [facility] @ [facility], upcoming already scheduled DEXA - done ~2005 in [place] and normal per patient.  Lung cancer screening - not eligible  Flu yearly Toco 02/2019, 03/2019, no booster but planning to get bivalent one Tetanus - 2010 Pneumovax 2008, prevnar-13 2015 Shingrix - 01/2021, 03/2021  Advanced directive: Received, scanned 2019. HCPOA are daughter [name] or SIL [name]. Does not want prolonged life support if terminal condition but does want to receive artifical nutrition and hydration. If advanced dementia, would grant discretion to [facility] for organ donor. Wants to be cremated.  Seat belt use discussed Sunscreen use discussed. notes new spots on legs  Ex smoker quit 1969 Alcohol - occasional Dentist - Q6 mo Eye exam - yearly Bowels - no constipation  Bladder - notes urgency and frequency as well as nocturia x4-6 - last year we started oxybutynin with some benefit. Myrbetriq was helpful but unaffordable.    Caffeine: 1 cup coffee/day Lives alone. 1 daughter [name]) Occupation: retired, was Chief Technology Officer. Activity: walking and swimming  Diet: good water, fruits/vegetables     Relevant past medical, surgical, family and social history reviewed and updated as indicated. Interim medical history since our last visit reviewed. Allergies and medications reviewed and updated. Outpatient Medications Prior to Visit  Medication Sig Dispense Refill   APPLE CIDER VINEGAR PO Take 1 capsule by mouth daily.     Calcium Citrate-Vitamin D (CALCIUM CITRATE + D PO)  Take 1 tablet by mouth daily.      Coenzyme Q10 (COQ10) 200 MG CAPS Take 1 capsule by mouth daily.      CRANBERRY PO Take by mouth.     Ferrous Sulfate (IRON) 325 (65 Fe) MG TABS Take by mouth daily.     loratadine (CLARITIN) 10 MG tablet Take 10 mg by mouth daily as needed for allergies. Reported on 03/17/2015     Melatonin 5 MG TABS Take 5 mg by mouth at bedtime.      Omega-3 Fatty Acids (FISH OIL) 1000 MG CAPS Take 1 capsule by mouth daily.     vitamin B-12 (CYANOCOBALAMIN) 1000 MCG tablet Take 1,000 mcg by mouth daily.     atorvastatin (LIPITOR) 10 MG tablet TAKE 1 TABLET BY MOUTH EVERY DAY 90 tablet 0   lansoprazole (PREVACID) 15 MG capsule TAKE 1 CAPSULE BY MOUTH EVERY DAY 90 capsule 0   levothyroxine (SYNTHROID) 25 MCG tablet TAKE 1 TABLET BY MOUTH EVERY DAY 90 tablet 0   oxybutynin (DITROPAN) 5 MG tablet TAKE 1 TABLET (5 MG TOTAL) BY MOUTH 2 (TWO) TIMES DAILY AS NEEDED FOR BLADDER SPASMS. 40 tablet 0   sertraline (ZOLOFT) 100 MG tablet TAKE 1 TABLET BY MOUTH EVERY DAY 90 tablet 0   No facility-administered medications prior to visit.     Per HPI unless specifically indicated in ROS section below Review of Systems  Constitutional:  Negative for activity change, appetite change, chills, fatigue, fever and unexpected weight change.  HENT:  Negative for hearing loss.   Eyes:  Negative for visual disturbance.  Respiratory:  Positive for shortness of breath (exertional). Negative for cough, chest tightness and wheezing.   Cardiovascular:  Positive for palpitations. Negative for chest pain and leg swelling.  Gastrointestinal:  Negative for abdominal distention, abdominal pain, blood in stool, constipation, diarrhea, nausea and vomiting.  Genitourinary:  Negative for difficulty urinating and hematuria.  Musculoskeletal:  Negative for arthralgias, myalgias and neck pain.  Skin:  Negative for rash.  Neurological:  Positive for dizziness (vertigo). Negative for seizures, syncope and  headaches.  Hematological:  Negative for adenopathy. Does not bruise/bleed easily.  Psychiatric/Behavioral:  Negative for dysphoric mood. The patient is nervous/anxious.     Objective:  BP 130/70   Pulse 64   Temp (!) 97.3 F (36.3 C) (Temporal)   Ht 5' 7.5" (1.715 m)   Wt 220 lb (99.8 kg)   SpO2 96%   BMI 33.95 kg/m   Wt Readings from Last 3 Encounters:  03/30/22 220 lb (99.8 kg)  12/24/21 225 lb (102.1 kg)  06/29/21 225 lb 8 oz (102.3 kg)      Physical Exam Vitals and nursing note reviewed.  Constitutional:      Appearance: Normal appearance. She is not ill-appearing.  HENT:  Head: Normocephalic and atraumatic.     Right Ear: Tympanic membrane, ear canal and external ear normal. There is no impacted cerumen.     Left Ear: Tympanic membrane, ear canal and external ear normal. There is no impacted cerumen.     Mouth/Throat:     Comments: Wearing mask Eyes:     General:        Right eye: No discharge.        Left eye: No discharge.     Extraocular Movements: Extraocular movements intact.     Conjunctiva/sclera: Conjunctivae normal.     Pupils: Pupils are equal, round, and reactive to light.  Neck:     Thyroid: No thyroid mass or thyromegaly.     Vascular: No carotid bruit.  Cardiovascular:     Rate and Rhythm: Normal rate. Rhythm irregularly irregular.     Pulses: Normal pulses.     Heart sounds: Normal heart sounds. No murmur heard. Pulmonary:     Effort: Pulmonary effort is normal. No respiratory distress.     Breath sounds: Normal breath sounds. No wheezing, rhonchi or rales.  Abdominal:     General: Bowel sounds are normal. There is no distension.     Palpations: Abdomen is soft. There is no mass.     Tenderness: There is no abdominal tenderness. There is no guarding or rebound.     Hernia: No hernia is present.  Musculoskeletal:     Cervical back: Normal range of motion and neck supple. No rigidity.     Right lower leg: No edema.     Left lower leg: No  edema.  Lymphadenopathy:     Cervical: No cervical adenopathy.  Skin:    General: Skin is warm and dry.     Findings: Lesion present. No rash.     Comments: Few pale rough patches to BLE, stuck on appearance  Neurological:     General: No focal deficit present.     Mental Status: She is alert. Mental status is at baseline.     Comments:  CN grossly intact [place] mildly positive on right without significant nystagmus  Psychiatric:        Mood and Affect: Mood normal.        Behavior: Behavior normal.       Results for orders placed or performed in visit on 01/27/22  VITAMIN D 25 Hydroxy (Vit-D Deficiency, Fractures)  Result Value Ref Range   VITD 64.40 30.00 - 100.00 ng/mL  Parathyroid hormone, intact (no Ca)  Result Value Ref Range   PTH 139 (H) 16 - 77 pg/mL  Microalbumin / creatinine urine ratio  Result Value Ref Range   Microalb, Ur 6.2 (H) 0.0 - 1.9 mg/dL   Creatinine,U 212.2 mg/dL   Microalb Creat Ratio 2.9 0.0 - 30.0 mg/g  CBC with Differential/Platelet  Result Value Ref Range   WBC 5.5 4.0 - 10.5 K/uL   RBC 3.69 (L) 3.87 - 5.11 Mil/uL   Hemoglobin 12.3 12.0 - 15.0 g/dL   HCT 36.2 36.0 - 46.0 %   MCV 98.3 78.0 - 100.0 fl   MCHC 34.1 30.0 - 36.0 g/dL   RDW 12.9 11.5 - 15.5 %   Platelets 158.0 150.0 - 400.0 K/uL   Neutrophils Relative % 34.1 (L) 43.0 - 77.0 %   Lymphocytes Relative 48.7 (H) 12.0 - 46.0 %   Monocytes Relative 16.9 (H) 3.0 - 12.0 %   Eosinophils Relative 0.2 0.0 - 5.0 %   Basophils  Relative 0.1 0.0 - 3.0 %   Neutro Abs 1.9 1.4 - 7.7 K/uL   Lymphs Abs 2.7 0.7 - 4.0 K/uL   Monocytes Absolute 0.9 0.1 - 1.0 K/uL   Eosinophils Absolute 0.0 0.0 - 0.7 K/uL   Basophils Absolute 0.0 0.0 - 0.1 K/uL  Hemoglobin A1c  Result Value Ref Range   Hgb A1c MFr Bld 6.3 4.6 - 6.5 %  TSH  Result Value Ref Range   TSH 3.39 0.35 - 5.50 uIU/mL  Comprehensive metabolic panel  Result Value Ref Range   Sodium 141 135 - 145 mEq/L   Potassium 3.9 3.5 - 5.1 mEq/L    Chloride 103 96 - 112 mEq/L   CO2 26 19 - 32 mEq/L   Glucose, Bld 129 (H) 70 - 99 mg/dL   BUN 20 6 - 23 mg/dL   Creatinine, Ser 1.05 0.40 - 1.20 mg/dL   Total Bilirubin 0.4 0.2 - 1.2 mg/dL   Alkaline Phosphatase 60 39 - 117 U/L   AST 18 0 - 37 U/L   ALT 9 0 - 35 U/L   Total Protein 7.2 6.0 - 8.3 g/dL   Albumin 4.5 3.5 - 5.2 g/dL   GFR 50.34 (L) >60.00 mL/min   Calcium 9.1 8.4 - 10.5 mg/dL  Lipid panel  Result Value Ref Range   Cholesterol 177 0 - 200 mg/dL   Triglycerides 180.0 (H) 0.0 - 149.0 mg/dL   HDL 56.80 >39.00 mg/dL   VLDL 36.0 0.0 - 40.0 mg/dL   LDL Cholesterol 84 0 - 99 mg/dL   Total CHOL/HDL Ratio 3    NonHDL 120.24    Echocardiogram 02/2021: LVEF 60-65%, normal wall motion, mild LVH, G1DD, normal RV function, trivial MR, AV sclerosis without stenosis, normal RA pressure 38mHg  EKG today - NSR rate 60s with rate variability and frequent PACs, mild LAD, normal intervals, p mitrale with LAA, no acute ST/T changes, poor R wave progression  Assessment & Plan:   Problem List Items Addressed This Visit     Encounter for general adult medical examination with abnormal findings - Primary (Chronic)    Preventative protocols reviewed and updated unless pt declined. Discussed healthy diet and lifestyle.       Advanced care planning/counseling discussion (Chronic)    Previously discussed      MDD (major depressive disorder), recurrent episode, moderate (HCC)    Chronic, stable period on sertraline '100mg'$  daily with melatonin for sleep.       Relevant Medications   sertraline (ZOLOFT) 100 MG tablet   Prediabetes    Chronic, stable in prediabetes range.       GERD (gastroesophageal reflux disease)    Stable period on prevacid '15mg'$  daily      Relevant Medications   lansoprazole (PREVACID) 15 MG capsule   Hypothyroidism    Chronic, stable on levothyroxine 268m daily.       Relevant Medications   levothyroxine (SYNTHROID) 25 MCG tablet   Urge incontinence     Managed with oxybutynin '5mg'$  BID PRN.  Myrbetriq was beneficial but unaffordable.       Relevant Medications   oxybutynin (DITROPAN) 5 MG tablet   [place]   Last saw onc 2019. Continued surveillance, latest reassuring colonoscopy 2022 with rpt recommended in 3 yrs.       Severe obesity (BMI 35.0-39.9) with comorbidity ([facility]   Encouraged healthy diet and lifestyle choices to affect sustainable weight loss.  HLD (hyperlipidemia)    Chronic, stable on low dose atorvastatin - continue. She also takes coq10 daily supplemetn The ASCVD Risk score (Arnett DK, et al., 2019) failed to calculate for the following reasons:   The 2019 ASCVD risk score is only valid for ages 99 to 44       Relevant Medications   atorvastatin (LIPITOR) 10 MG tablet   Acquired deafness of left ear    Chronic, longstanding. Saw [id number] audiologist Dr Sherilyn Banker 11/2021 - asymmetric sensorineural hearing loss  Saw [facility] Shanon Brow Spainhoor PA) - sounds like he recommended MRI but pt did not complete. Will request OV records.       Chronic vertigo    Chronic issue with acquired L ear deafness.  She has previously been referred for vestibular rehab.  Most recently saw ENT and audiologist 11/2021 - she was recommended brain imaging but didn't complete as she was dissatisfied with evaluation. I reviewed ENT OV - rec MRI r/o acoustic neuroma. Will see if she would like to proceed with this.       CKD (chronic kidney disease) stage 3, GFR 30-59 ml/min (HCC)    Chronic, stable with latest GFR 50.       Exertional dyspnea    Overall stable period - s/p reassuring cardiac eval last year.       Atherosclerosis of aorta (HCC)    Continue low dose atorvastatin.       Relevant Medications   atorvastatin (LIPITOR) 10 MG tablet   Asymmetric SNHL (sensorineural hearing loss)    Chronic. See below      Skin lesions    Few spots to legs - anticipate benign SKs, reassurance provided.       Irregular heart beat    Update EKG today - not consistent with afib but rather PACs.       Relevant Orders   EKG 12-Lead (Completed)     Meds ordered this encounter  Medications   atorvastatin (LIPITOR) 10 MG tablet    Sig: Take 1 tablet (10 mg total) by mouth daily.    Dispense:  90 tablet    Refill:  4   lansoprazole (PREVACID) 15 MG capsule    Sig: Take 1 capsule (15 mg total) by mouth daily.    Dispense:  90 capsule    Refill:  4   levothyroxine (SYNTHROID) 25 MCG tablet    Sig: Take 1 tablet (25 mcg total) by mouth daily.    Dispense:  90 tablet    Refill:  4   oxybutynin (DITROPAN) 5 MG tablet    Sig: Take 1 tablet (5 mg total) by mouth 2 (two) times daily as needed for bladder spasms.    Dispense:  40 tablet    Refill:  3   sertraline (ZOLOFT) 100 MG tablet    Sig: Take 1 tablet (100 mg total) by mouth daily.    Dispense:  90 tablet    Refill:  4    Orders Placed This Encounter  Procedures   EKG 12-Lead    Patient Instructions  For right sided vertigo - try repositioning maneuvers provided today. If this doesn't help, let me know for referral to vestibular rehab (physical therapy).  Medicines refilled today.  Spots on legs look ok Repeat EKG today.  We will request records from [facility] 11/2021 - for ENT office visit.    Follow up plan: Return in about 1 year (around 03/30/2023), or if  symptoms worsen or fail to improve, for annual exam, prior fasting for blood work, medicare wellness visit.  Ria Bush, MD

## 2022-03-30 NOTE — Assessment & Plan Note (Signed)
Previously discussed.

## 2022-03-31 ENCOUNTER — Encounter: Payer: Self-pay | Admitting: Family Medicine

## 2022-04-01 ENCOUNTER — Telehealth: Payer: Self-pay | Admitting: Family Medicine

## 2022-04-01 DIAGNOSIS — I499 Cardiac arrhythmia, unspecified: Secondary | ICD-10-CM | POA: Insufficient documentation

## 2022-04-01 DIAGNOSIS — H903 Sensorineural hearing loss, bilateral: Secondary | ICD-10-CM

## 2022-04-01 DIAGNOSIS — R42 Dizziness and giddiness: Secondary | ICD-10-CM

## 2022-04-01 DIAGNOSIS — L989 Disorder of the skin and subcutaneous tissue, unspecified: Secondary | ICD-10-CM | POA: Insufficient documentation

## 2022-04-01 DIAGNOSIS — H9192 Unspecified hearing loss, left ear: Secondary | ICD-10-CM

## 2022-04-01 NOTE — Assessment & Plan Note (Signed)
Chronic, stable in prediabetes range.

## 2022-04-01 NOTE — Assessment & Plan Note (Signed)
Managed with oxybutynin '5mg'$  BID PRN.  Myrbetriq was beneficial but unaffordable.

## 2022-04-01 NOTE — Telephone Encounter (Signed)
Plz notify I reviewed OV from ENT - recommendation was for contrasted MRI of brain to evaluate for possible mass of cranial nerve called acoustic neuroma which can cause hearing loss, vertigo and ringing in ear. If this is contributing to symptoms, she could have a treatment done which may help control vertigo (surgery vs radiation). Let me know if she'd like MRI ordered and I can order

## 2022-04-01 NOTE — Assessment & Plan Note (Signed)
Continue low dose atorvastatin  

## 2022-04-01 NOTE — Assessment & Plan Note (Signed)
Chronic, stable on levothyroxine 53mg daily.

## 2022-04-01 NOTE — Assessment & Plan Note (Signed)
Few spots to legs - anticipate benign SKs, reassurance provided.

## 2022-04-01 NOTE — Assessment & Plan Note (Addendum)
Chronic issue with acquired L ear deafness.  She has previously been referred for vestibular rehab.  Most recently saw ENT and audiologist 11/2021 - she was recommended brain imaging but didn't complete as she was dissatisfied with evaluation. I reviewed ENT OV - rec MRI r/o acoustic neuroma. Will see if she would like to proceed with this.

## 2022-04-01 NOTE — Assessment & Plan Note (Addendum)
Chronic. See below

## 2022-04-01 NOTE — Assessment & Plan Note (Addendum)
Chronic, stable on low dose atorvastatin - continue. She also takes coq10 daily supplemetn The ASCVD Risk score (Arnett DK, et al., 2019) failed to calculate for the following reasons:   The 2019 ASCVD risk score is only valid for ages 17 to 59

## 2022-04-01 NOTE — Addendum Note (Signed)
Addended by: Ria Bush on: 04/01/2022 04:43 PM   Modules accepted: Orders

## 2022-04-01 NOTE — Assessment & Plan Note (Addendum)
Encouraged healthy diet and lifestyle choices to affect sustainable weight loss.  ?

## 2022-04-01 NOTE — Assessment & Plan Note (Signed)
Chronic, longstanding. Saw [id number] audiologist Dr Sherilyn Banker 11/2021 - asymmetric sensorineural hearing loss  Saw [facility] Shanon Brow [place] [facility]) - sounds like he recommended MRI but pt did not complete. Will request OV records.

## 2022-04-01 NOTE — Assessment & Plan Note (Signed)
Stable period on prevacid '15mg'$  daily

## 2022-04-01 NOTE — Telephone Encounter (Signed)
Spoke with pt relaying Dr. Synthia Innocent message. Pt verbalizes understanding stating the recommended MRI was explained to her. Pt agrees and wants Dr. Darnell Level to order it.

## 2022-04-01 NOTE — Assessment & Plan Note (Addendum)
Last saw onc 2019. Continued surveillance, latest reassuring colonoscopy 2022 with rpt recommended in 3 yrs.

## 2022-04-01 NOTE — Assessment & Plan Note (Addendum)
Update EKG today - not consistent with afib but rather PACs.

## 2022-04-01 NOTE — Assessment & Plan Note (Signed)
Chronic, stable period on sertraline '100mg'$  daily with melatonin for sleep.

## 2022-04-01 NOTE — Telephone Encounter (Signed)
MR brain ordered to [facility].

## 2022-04-01 NOTE — Assessment & Plan Note (Signed)
Chronic, stable with latest GFR 50.

## 2022-04-01 NOTE — Assessment & Plan Note (Signed)
Overall stable period - s/p reassuring cardiac eval last year.

## 2022-04-02 NOTE — Addendum Note (Signed)
Addended by: Ria Bush on: 04/02/2022 01:27 PM   Modules accepted: Orders

## 2022-04-03 ENCOUNTER — Encounter: Payer: Self-pay | Admitting: Family Medicine

## 2022-04-03 DIAGNOSIS — D72821 Monocytosis (symptomatic): Secondary | ICD-10-CM | POA: Insufficient documentation

## 2022-04-05 ENCOUNTER — Encounter: Payer: Self-pay | Admitting: *Deleted

## 2022-04-09 ENCOUNTER — Ambulatory Visit
Admission: RE | Admit: 2022-04-09 | Discharge: 2022-04-09 | Disposition: A | Discharge status: Home / Self Care | Payer: Medicare PPO | Source: Ambulatory Visit | Attending: Family Medicine | Admitting: Family Medicine

## 2022-04-09 DIAGNOSIS — Z1231 Encounter for screening mammogram for malignant neoplasm of breast: Secondary | ICD-10-CM | POA: Diagnosis not present

## 2022-04-28 ENCOUNTER — Ambulatory Visit
Admission: RE | Admit: 2022-04-28 | Discharge: 2022-04-28 | Disposition: A | Discharge status: Home / Self Care | Payer: Medicare PPO | Source: Ambulatory Visit | Attending: Family Medicine | Admitting: Family Medicine

## 2022-04-28 DIAGNOSIS — H903 Sensorineural hearing loss, bilateral: Secondary | ICD-10-CM

## 2022-04-28 DIAGNOSIS — H9192 Unspecified hearing loss, left ear: Secondary | ICD-10-CM

## 2022-04-28 DIAGNOSIS — R42 Dizziness and giddiness: Secondary | ICD-10-CM

## 2022-04-28 MED ORDER — GADOPICLENOL 0.5 MMOL/ML IV SOLN
10.0000 mL | Freq: Once | INTRAVENOUS | Status: AC | PRN
  Administered 2022-04-28: 10 mL via INTRAVENOUS

## 2022-05-17 ENCOUNTER — Encounter: Payer: Self-pay | Admitting: Family Medicine

## 2022-05-17 ENCOUNTER — Ambulatory Visit (INDEPENDENT_AMBULATORY_CARE_PROVIDER_SITE_OTHER): Payer: Medicare PPO | Admitting: Family Medicine

## 2022-05-17 VITALS — BP 126/64 | HR 61 | Temp 97.1°F | Ht 67.5 in | Wt 221.4 lb

## 2022-05-17 DIAGNOSIS — R21 Rash and other nonspecific skin eruption: Secondary | ICD-10-CM | POA: Diagnosis not present

## 2022-05-17 MED ORDER — CLOTRIMAZOLE 1 % EX CREA: 1.0000 | TOPICAL_CREAM | Freq: Two times a day (BID) | CUTANEOUS | 0 refills | Status: DC

## 2022-05-17 NOTE — Patient Instructions (Signed)
I think this is intertrigo fungal infection Treat with clotrimazole (Lotrimin) antifungal cream twice daily to affected area for 3-4 weeks.  Let us know if not improving with this.

## 2022-05-17 NOTE — Progress Notes (Signed)
Ph: [phone]       Fax: [phone]   Patient ID: Lisa Osborne, female    DOB: [birth year], 82 y.o.   MRN: [id number]  This visit was conducted in person.  BP 126/64   Pulse 61   Temp (!) 97.1 F (36.2 C) (Temporal)   Ht 5' 7.5" (1.715 m)   Wt 221 lb 6 oz (100.4 kg)   SpO2 94%   BMI 34.16 kg/m    CC: rash Subjective:   HPI: Lisa Osborne is a 82 y.o. female presenting on 05/17/2022 for Rash (C/o rash under B axilla. Started about 2 wks ago after working in the yard. Areas burn and extremely itchy and are spreading. Tried Benadryl and Cortizone cream, barely helpful. )   She's been working in her garden.  2 wk h/o rash that started to right axilla. Yesterday bumps developed under left arm. Burning, very itchy.  Has tried benadryl cream and cortizone cream with some benefit.   No new lotions, detergents, soap or shampoos,  No new foods, medicines.   No fevers/chills, abd pain, nausea, new joint pains, tick bites.  No oral lesions.   Recent brain MRI reassuringly negative for internal auditory canal mass.  Asymmetrical hearing loss continues.   Stressful period currently - despite sertraline  daily. She has h/o 4 suicides in family, all around this time.      Relevant past medical, surgical, family and social history reviewed and updated as indicated. Interim medical history since our last visit reviewed. Allergies and medications reviewed and updated. Outpatient Medications Prior to Visit  Medication Sig Dispense Refill   APPLE CIDER VINEGAR PO Take 1 capsule by mouth daily.     atorvastatin (LIPITOR) 10 MG tablet Take 1 tablet (10 mg total) by mouth daily. 90 tablet 4   Calcium Citrate-Vitamin D (CALCIUM CITRATE + D PO) Take 1 tablet by mouth daily.      Coenzyme Q10 (COQ10) 200 MG CAPS Take 1 capsule by mouth daily.      CRANBERRY PO Take by mouth.     Ferrous Sulfate (IRON) 325 (65 Fe) MG TABS Take by mouth daily.     lansoprazole  (PREVACID) 15 MG capsule Take 1 capsule (15 mg total) by mouth daily. 90 capsule 4   levothyroxine (SYNTHROID) 25 MCG tablet Take 1 tablet (25 mcg total) by mouth daily. 90 tablet 4   loratadine (CLARITIN) 10 MG tablet Take 10 mg by mouth daily as needed for allergies. Reported on 03/17/2015     Melatonin 5 MG TABS Take 5 mg by mouth at bedtime.      Omega-3 Fatty Acids (FISH OIL) 1000 MG CAPS Take 1 capsule by mouth daily.     oxybutynin (DITROPAN) 5 MG tablet Take 1 tablet (5 mg total) by mouth 2 (two) times daily as needed for bladder spasms. 40 tablet 3   sertraline (ZOLOFT) 100 MG tablet Take 1 tablet (100 mg total) by mouth daily. 90 tablet 4   vitamin B-12 (CYANOCOBALAMIN) 1000 MCG tablet Take 1,000 mcg by mouth daily.     No facility-administered medications prior to visit.     Per HPI unless specifically indicated in ROS section below Review of Systems  Objective:  BP 126/64   Pulse 61   Temp (!) 97.1 F (36.2 C) (Temporal)   Ht 5' 7.5" (1.715 m)   Wt 221 lb 6 oz (100.4 kg)   SpO2 94%   BMI 34.16 kg/m  Wt Readings from Last 3 Encounters:  05/17/22 221 lb 6 oz (100.4 kg)  03/30/22 220 lb (99.8 kg)  12/24/21 225 lb (102.1 kg)      Physical Exam Vitals and nursing note reviewed.  Constitutional:      Appearance: Normal appearance. She is not ill-appearing.  Skin:    General: Skin is warm and dry.     Findings: Erythema and rash present.     Comments:  Dark red rash under R arm with associated papules  Developing papules to L axilla without red skin changes No fluorescence under wood's lamp  Neurological:     Mental Status: She is alert.        Assessment & Plan:   Problem List Items Addressed This Visit     Skin rash - Primary    Anticipate intertrigo under arms R>L Rx lotrimin BID x 3-4 wks Update if not improving with treatment. Intertrigo handout provided.         Meds ordered this encounter  Medications   clotrimazole (LOTRIMIN AF) 1 % cream     Sig: Apply 1 Application topically 2 (two) times daily.    Dispense:  80 g    Refill:  0    No orders of the defined types were placed in this encounter.   Patient Instructions  I think this is intertrigo fungal infection Treat with clotrimazole (Lotrimin) antifungal cream twice daily to affected area for 3-4 weeks.  Let us know if not improving with this.   Follow up plan: Return if symptoms worsen or fail to improve.  Eustaquio Boyden, MD

## 2022-05-17 NOTE — Assessment & Plan Note (Signed)
Anticipate intertrigo under arms R>L Rx lotrimin BID x 3-4 wks Update if not improving with treatment. Intertrigo handout provided.

## 2022-07-13 ENCOUNTER — Ambulatory Visit
Admission: RE | Admit: 2022-07-13 | Discharge: 2022-07-13 | Disposition: A | Discharge status: Home / Self Care | Payer: Medicare PPO | Source: Ambulatory Visit | Attending: Family Medicine | Admitting: Family Medicine

## 2022-07-13 DIAGNOSIS — Z780 Asymptomatic menopausal state: Secondary | ICD-10-CM | POA: Insufficient documentation

## 2022-07-13 DIAGNOSIS — M85851 Other specified disorders of bone density and structure, right thigh: Secondary | ICD-10-CM | POA: Diagnosis not present

## 2022-07-22 ENCOUNTER — Encounter: Payer: Self-pay | Admitting: Family Medicine

## 2022-07-22 DIAGNOSIS — M8580 Other specified disorders of bone density and structure, unspecified site: Secondary | ICD-10-CM | POA: Insufficient documentation

## 2022-11-12 ENCOUNTER — Encounter: Payer: Self-pay | Admitting: Family Medicine

## 2022-11-12 ENCOUNTER — Other Ambulatory Visit: Payer: Self-pay | Admitting: Family Medicine

## 2022-11-12 ENCOUNTER — Ambulatory Visit: Payer: Medicare PPO | Admitting: Family Medicine

## 2022-11-12 VITALS — BP 124/74 | HR 80 | Temp 97.6°F | Ht 67.5 in | Wt 219.1 lb

## 2022-11-12 DIAGNOSIS — Z7985 Long-term (current) use of injectable non-insulin antidiabetic drugs: Secondary | ICD-10-CM | POA: Diagnosis not present

## 2022-11-12 DIAGNOSIS — E1169 Type 2 diabetes mellitus with other specified complication: Secondary | ICD-10-CM

## 2022-11-12 DIAGNOSIS — E785 Hyperlipidemia, unspecified: Secondary | ICD-10-CM

## 2022-11-12 DIAGNOSIS — R21 Rash and other nonspecific skin eruption: Secondary | ICD-10-CM

## 2022-11-12 DIAGNOSIS — F331 Major depressive disorder, recurrent, moderate: Secondary | ICD-10-CM

## 2022-11-12 LAB — POCT GLYCOSYLATED HEMOGLOBIN (HGB A1C): Hemoglobin A1C: 6.3 % — AB (ref 4.0–5.6)

## 2022-11-12 MED ORDER — OZEMPIC (0.25 OR 0.5 MG/DOSE) 2 MG/3ML ~~LOC~~ SOPN
0.2500 mg | PEN_INJECTOR | SUBCUTANEOUS | 0 refills | Status: DC
Start: 2022-11-12 — End: 2023-03-22

## 2022-11-12 MED ORDER — NYSTATIN 100000 UNIT/GM EX CREA
1.0000 | TOPICAL_CREAM | Freq: Two times a day (BID) | CUTANEOUS | 1 refills | Status: DC
Start: 2022-11-12 — End: 2023-04-01

## 2022-11-12 MED ORDER — OZEMPIC (0.25 OR 0.5 MG/DOSE) 2 MG/3ML ~~LOC~~ SOPN: 0.5000 mg | PEN_INJECTOR | SUBCUTANEOUS | 3 refills | Status: DC

## 2022-11-12 MED ORDER — FLUCONAZOLE 150 MG PO TABS: 150.0000 mg | ORAL_TABLET | ORAL | 0 refills | Status: DC

## 2022-11-12 NOTE — Assessment & Plan Note (Signed)
Suspect candidal intertrigo - see above.

## 2022-11-12 NOTE — Patient Instructions (Signed)
VISIT SUMMARY: Dear Lisa Osborne, thank you for coming in to discuss your recurrent skin rash and your interest in weight loss options. We have identified the rash as likely being a yeast infection, which we will treat with a cream. We will also consider a medication for your diabetes that may assist with weight loss, pending insurance approval.  YOUR PLAN: -SKIN RASH: Your skin rash is likely a yeast infection. We will treat this with Nystatin cream, which you should apply twice daily. If there's no improvement after a week, we will start you on Diflucan, an antifungal pill, once a week for two weeks. Please remember to pause your Lipitor medication for 1-2 days each time you take Diflucan to avoid potential drug interactions.  -TYPE 2 DIABETES AND WEIGHT LOSS: Your diabetes is currently controlled by diet, and your last A1c was 6.3. We will perform a point of care A1c today to assess your current blood sugar control. We are considering Ozempic, a medication that can help with both blood sugar control and weight loss, pending insurance approval. If your insurance requires a trial of Metformin first, you have agreed to try it for a couple of months. If we start Ozempic, we will begin at a low dose and gradually increase it. We will recheck your progress at your physical.  INSTRUCTIONS: Please apply the Nystatin cream to your rash twice daily. If there's no improvement after a week, please contact us to start the Diflucan treatment. Remember to pause your Lipitor medication for 1-2 days each time you take Diflucan. We will also perform a point of care A1c today to assess your current blood sugar control. If we start Ozempic or Metformin, we will provide further instructions.

## 2022-11-12 NOTE — Progress Notes (Signed)
Ph: [phone] Fax: [phone]   Patient ID: Lisa Osborne, female    DOB: 1940/08/03, 82 y.o.   MRN: [id number]  This visit was conducted in person.  BP 124/74   Pulse 80   Temp 97.6 F (36.4 C) (Oral)   Ht 5' 7.5" (1.715 m)   Wt 219 lb 2 oz (99.4 kg)   SpO2 97%   BMI 33.81 kg/m    Chief Complaint  Patient presents with   Rash    C/o fungal infection in B axillae. Seen for previously and treated with Lotrimin. Sxs resolved but have now returned.     Subjective:   Discussed the use of AI scribe software for clinical note transcription with the patient, who gave verbal consent to proceed.  History of Present Illness   Lisa Osborne, an 82 year old with a history of colon cancer, chronic kidney disease with secondary hyperparathyroidism, diet-controlled diabetes, and hypothyroidism, presents with a recurrent skin rash under her arms. She was previously seen in April for a similar rash that developed after gardening. At that time, she was treated with over-the-counter clotrimazole for suspected intertrigo, which initially improved her symptoms. However, the rash has now recurred. It did take several months to clear initially.   The patient reports that the rash reappeared about a week ago, initially in the right axilla and then in the left axilla. The rash is described as having bumps and is very itchy, particularly at night. The patient denies using any new soaps, detergents, or deodorants, and has not applied any other creams or lotions to the area. She has been washing the area.  In addition to her skin concerns, the patient expresses interest in starting Ozempic or Mounjaro for weight loss as her Type 1 diabetic daughter has had success with weight loss on this medication. She has type 2 diabetes, which has been diet-controlled, and her last A1c was 6.3. She has been trying to lose weight by eating less but finds it challenging.          Relevant past medical,  surgical, family and social history reviewed and updated as indicated. Interim medical history since our last visit reviewed. Allergies and medications reviewed and updated. Outpatient Medications Prior to Visit  Medication Sig Dispense Refill   APPLE CIDER VINEGAR PO Take 1 capsule by mouth daily.     atorvastatin (LIPITOR) 10 MG tablet TAKE 1 TABLET BY MOUTH EVERY DAY 90 tablet 4   Calcium Citrate-Vitamin D (CALCIUM CITRATE + D PO) Take 1 tablet by mouth daily.      clotrimazole (LOTRIMIN AF) 1 % cream Apply 1 Application topically 2 (two) times daily. 80 g 0   Coenzyme Q10 (COQ10) 200 MG CAPS Take 1 capsule by mouth daily.      CRANBERRY PO Take by mouth.     Ferrous Sulfate (IRON) 325 (65 Fe) MG TABS Take by mouth daily.     lansoprazole (PREVACID) 15 MG capsule Take 1 capsule (15 mg total) by mouth daily. 90 capsule 4   levothyroxine (SYNTHROID) 25 MCG tablet Take 1 tablet (25 mcg total) by mouth daily. 90 tablet 4   loratadine (CLARITIN) 10 MG tablet Take 10 mg by mouth daily as needed for allergies. Reported on 03/17/2015     Melatonin 5 MG TABS Take 5 mg by mouth at bedtime.      Omega-3 Fatty Acids (FISH OIL) 1000 MG CAPS Take 1 capsule by mouth daily.     oxybutynin (DITROPAN) 5  MG tablet Take 1 tablet (5 mg total) by mouth 2 (two) times daily as needed for bladder spasms. 40 tablet 3   sertraline (ZOLOFT) 100 MG tablet TAKE 1 TABLET BY MOUTH EVERY DAY 90 tablet 4   vitamin B-12 (CYANOCOBALAMIN) 1000 MCG tablet Take 1,000 mcg by mouth daily.     No facility-administered medications prior to visit.     Per HPI unless specifically indicated in ROS section below Review of Systems  Objective:  BP 124/74   Pulse 80   Temp 97.6 F (36.4 C) (Oral)   Ht 5' 7.5" (1.715 m)   Wt 219 lb 2 oz (99.4 kg)   SpO2 97%   BMI 33.81 kg/m   Wt Readings from Last 3 Encounters:  11/12/22 219 lb 2 oz (99.4 kg)  05/17/22 221 lb 6 oz (100.4 kg)  03/30/22 220 lb (99.8 kg)      Physical Exam    SKIN: Erythematous, pruritic rash with satellite lesions and red papules bilaterally under both arms. Rest of skin clear.       L axilla   R axilla       Lab Results  Component Value Date   HGBA1C 6.3 (A) 11/12/2022     Assessment & Plan:      Intertrigo Recurrent erythematous rash in bilateral axillae with satellite lesions, suggestive of a yeast infection. Previously responded to Clotrimazole but has recurred. -Prescribe Nystatin cream, apply twice daily. -If no improvement with Nystatin cream after 1 week, start Diflucan (antifungal pill) once weekly for 2 weeks. Hold Lipitor for 1-2 days each time Diflucan is taken due to potential drug interaction.  Type 2 Diabetes Diet-controlled with last A1c of 6.3. Patient interested in weight loss options. -Perform point of care A1c today to assess current glycemic control. -Consider Ozempic for weight loss and glycemic control, pending insurance approval and cost considerations. If insurance requires trial of Metformin first, patient agrees to trial for a couple of months. -If starting Ozempic, begin at 0.25mg  once weekly for a month, then increase to 0.5mg  once weekly. Recheck at physical.    Problem List Items Addressed This Visit     Type 2 diabetes mellitus with other specified complication [facility])    Patient is interested in [facility] or tirzepatide. Reviewed mechanism of action of medication as well as side effects and adverse events to watch for including nausea, diarrhea, constipation, pancreatitis. No fmhx medullary thyroid cancer or MEN2. Discussed titration schedule for medication. Will start Ozempic. Discussed need for regular visits for weight management to monitor medication effect and tolerance and weight loss, rec return 1 month after starting medication.  If insurance denies, will start metformin in its place.       Relevant Medications   Semaglutide,0.25 or 0.5MG /DOS, (OZEMPIC, 0.25 OR 0.5 MG/DOSE,) 2 MG/3ML SOPN    Semaglutide,0.25 or 0.5MG /DOS, (OZEMPIC, 0.25 OR 0.5 MG/DOSE,) 2 MG/3ML SOPN (Start on 12/10/2022)   Other Relevant Orders   POCT glycosylated hemoglobin (Hb A1C) (Completed)   Skin rash - Primary    Suspect candidal intertrigo - see above.         Meds ordered this encounter  Medications   nystatin cream (MYCOSTATIN)    Sig: Apply 1 Application topically 2 (two) times daily.    Dispense:  30 g    Refill:  1   fluconazole (DIFLUCAN) 150 MG tablet    Sig: Take 1 tablet (150 mg total) by mouth once a week.    Dispense:  2  tablet    Refill:  0   Semaglutide,0.25 or 0.5MG /DOS, (OZEMPIC, 0.25 OR 0.5 MG/DOSE,) 2 MG/3ML SOPN    Sig: Inject 0.25 mg into the skin once a week.    Dispense:  3 mL    Refill:  0   Semaglutide,0.25 or 0.5MG /DOS, (OZEMPIC, 0.25 OR 0.5 MG/DOSE,) 2 MG/3ML SOPN    Sig: Inject 0.5 mg into the skin once a week.    Dispense:  3 mL    Refill:  3    Orders Placed This Encounter  Procedures   POCT glycosylated hemoglobin (Hb A1C)    Patient Instructions  VISIT SUMMARY: Dear Lisa Osborne, thank you for coming in to discuss your recurrent skin rash and your interest in weight loss options. We have identified the rash as likely being a yeast infection, which we will treat with a cream. We will also consider a medication for your diabetes that may assist with weight loss, pending insurance approval.  YOUR PLAN: -SKIN RASH: Your skin rash is likely a yeast infection. We will treat this with Nystatin cream, which you should apply twice daily. If there's no improvement after a week, we will start you on Diflucan, an antifungal pill, once a week for two weeks. Please remember to pause your Lipitor medication for 1-2 days each time you take Diflucan to avoid potential drug interactions.  -TYPE 2 DIABETES AND WEIGHT LOSS: Your diabetes is currently controlled by diet, and your last A1c was 6.3. We will perform a point of care A1c today to assess your current blood sugar  control. We are considering Ozempic, a medication that can help with both blood sugar control and weight loss, pending insurance approval. If your insurance requires a trial of Metformin first, you have agreed to try it for a couple of months. If we start Ozempic, we will begin at a low dose and gradually increase it. We will recheck your progress at your physical.  INSTRUCTIONS: Please apply the Nystatin cream to your rash twice daily. If there's no improvement after a week, please contact us to start the Diflucan treatment. Remember to pause your Lipitor medication for 1-2 days each time you take Diflucan. We will also perform a point of care A1c today to assess your current blood sugar control. If we start Ozempic or Metformin, we will provide further instructions.  Follow up plan: Return if symptoms worsen or fail to improve.  Eustaquio Boyden, MD

## 2022-11-12 NOTE — Assessment & Plan Note (Signed)
Patient is interested in [place] or tirzepatide. Reviewed mechanism of action of medication as well as side effects and adverse events to watch for including nausea, diarrhea, constipation, pancreatitis. No fmhx medullary thyroid cancer or MEN2. Discussed titration schedule for medication. Will start Ozempic. Discussed need for regular visits for weight management to monitor medication effect and tolerance and weight loss, rec return 1 month after starting medication.  If insurance denies, will start metformin in its place.

## 2022-11-19 DIAGNOSIS — H524 Presbyopia: Secondary | ICD-10-CM | POA: Diagnosis not present

## 2022-11-19 DIAGNOSIS — H2513 Age-related nuclear cataract, bilateral: Secondary | ICD-10-CM | POA: Diagnosis not present

## 2022-11-19 DIAGNOSIS — H5203 Hypermetropia, bilateral: Secondary | ICD-10-CM | POA: Diagnosis not present

## 2022-11-19 DIAGNOSIS — H25013 Cortical age-related cataract, bilateral: Secondary | ICD-10-CM | POA: Diagnosis not present

## 2022-11-19 DIAGNOSIS — H21233 Degeneration of iris (pigmentary), bilateral: Secondary | ICD-10-CM | POA: Diagnosis not present

## 2022-11-19 DIAGNOSIS — E119 Type 2 diabetes mellitus without complications: Secondary | ICD-10-CM | POA: Diagnosis not present

## 2022-11-19 DIAGNOSIS — H25033 Anterior subcapsular polar age-related cataract, bilateral: Secondary | ICD-10-CM | POA: Diagnosis not present

## 2022-11-19 LAB — HM DIABETES EYE EXAM

## 2022-11-29 ENCOUNTER — Telehealth: Payer: Self-pay | Admitting: Pharmacist

## 2022-11-29 ENCOUNTER — Other Ambulatory Visit (HOSPITAL_COMMUNITY): Payer: Self-pay

## 2022-11-29 NOTE — Telephone Encounter (Signed)
Noted  

## 2022-11-29 NOTE — Telephone Encounter (Signed)
Pharmacy Patient Advocate Encounter  Received notification from [facility] that Prior Authorization for Ozempic (0.25 or 0.5 MG/DOSE) 2MG /3ML pen-injectors has been APPROVED from 11/29/2022 to 11/28/2025   PA #/Case ID/Reference #:  [id number]

## 2022-11-29 NOTE — Telephone Encounter (Signed)
Pharmacy Patient Advocate Encounter   Received notification from Physician's Office that prior authorization for Ozempic (0.25 or 0.5 MG/DOSE) 2MG /3ML pen-injectors is required/requested.   Insurance verification completed.   The patient is insured through [facility] .   Per test claim: PA required; PA submitted to above mentioned insurance via CoverMyMeds Key/confirmation #/EOC [id number] Status is pending

## 2023-01-15 ENCOUNTER — Encounter: Payer: Self-pay | Admitting: Family Medicine

## 2023-03-19 ENCOUNTER — Other Ambulatory Visit: Payer: Self-pay | Admitting: Family Medicine

## 2023-03-19 DIAGNOSIS — E1169 Type 2 diabetes mellitus with other specified complication: Secondary | ICD-10-CM

## 2023-03-21 NOTE — Telephone Encounter (Signed)
 Ozempic Last filled:  02/20/23, #3 mL Last OV:  11/12/22, skin rash Next OV:  04/01/23, CPE

## 2023-03-22 ENCOUNTER — Other Ambulatory Visit: Payer: Self-pay | Admitting: Family Medicine

## 2023-03-22 DIAGNOSIS — N1831 Chronic kidney disease, stage 3a: Secondary | ICD-10-CM

## 2023-03-22 DIAGNOSIS — E785 Hyperlipidemia, unspecified: Secondary | ICD-10-CM

## 2023-03-22 DIAGNOSIS — E1169 Type 2 diabetes mellitus with other specified complication: Secondary | ICD-10-CM

## 2023-03-22 DIAGNOSIS — E039 Hypothyroidism, unspecified: Secondary | ICD-10-CM

## 2023-03-22 NOTE — Telephone Encounter (Signed)
 ERx

## 2023-03-25 ENCOUNTER — Other Ambulatory Visit (INDEPENDENT_AMBULATORY_CARE_PROVIDER_SITE_OTHER): Payer: Medicare PPO

## 2023-03-25 DIAGNOSIS — E1169 Type 2 diabetes mellitus with other specified complication: Secondary | ICD-10-CM | POA: Diagnosis not present

## 2023-03-25 DIAGNOSIS — E039 Hypothyroidism, unspecified: Secondary | ICD-10-CM

## 2023-03-25 DIAGNOSIS — E785 Hyperlipidemia, unspecified: Secondary | ICD-10-CM | POA: Diagnosis not present

## 2023-03-25 DIAGNOSIS — N1831 Chronic kidney disease, stage 3a: Secondary | ICD-10-CM

## 2023-03-25 LAB — MICROALBUMIN / CREATININE URINE RATIO
Creatinine,U: 224.9 mg/dL
Microalb Creat Ratio: 38.9 mg/g — ABNORMAL HIGH (ref 0.0–30.0)
Microalb, Ur: 8.8 mg/dL — ABNORMAL HIGH (ref 0.0–1.9)

## 2023-03-25 LAB — COMPREHENSIVE METABOLIC PANEL
ALT: 9 U/L (ref 0–35)
AST: 17 U/L (ref 0–37)
Albumin: 4.4 g/dL (ref 3.5–5.2)
Alkaline Phosphatase: 59 U/L (ref 39–117)
BUN: 21 mg/dL (ref 6–23)
CO2: 29 meq/L (ref 19–32)
Calcium: 9.5 mg/dL (ref 8.4–10.5)
Chloride: 102 meq/L (ref 96–112)
Creatinine, Ser: 1.13 mg/dL (ref 0.40–1.20)
GFR: 45.72 mL/min — ABNORMAL LOW (ref >60.00)
Glucose, Bld: 109 mg/dL — ABNORMAL HIGH (ref 70–99)
Potassium: 4 meq/L (ref 3.5–5.1)
Sodium: 141 meq/L (ref 135–145)
Total Bilirubin: 0.5 mg/dL (ref 0.2–1.2)
Total Protein: 7.3 g/dL (ref 6.0–8.3)

## 2023-03-25 LAB — LIPID PANEL
Cholesterol: 146 mg/dL (ref 0–200)
HDL: 56.4 mg/dL (ref >39.00)
LDL Cholesterol: 62 mg/dL (ref 0–99)
NonHDL: 89.28
Total CHOL/HDL Ratio: 3
Triglycerides: 137 mg/dL (ref 0.0–149.0)
VLDL: 27.4 mg/dL (ref 0.0–40.0)

## 2023-03-25 LAB — CBC WITH DIFFERENTIAL/PLATELET
Basophils Absolute: 0 10*3/uL (ref 0.0–0.1)
Basophils Relative: 0.7 % (ref 0.0–3.0)
Eosinophils Absolute: 0 10*3/uL (ref 0.0–0.7)
Eosinophils Relative: 0.1 % (ref 0.0–5.0)
HCT: 37.8 % (ref 36.0–46.0)
Hemoglobin: 12.7 g/dL (ref 12.0–15.0)
Lymphocytes Relative: 43.2 % (ref 12.0–46.0)
Lymphs Abs: 2.7 10*3/uL (ref 0.7–4.0)
MCHC: 33.5 g/dL (ref 30.0–36.0)
MCV: 97.6 fL (ref 78.0–100.0)
Monocytes Absolute: 1 10*3/uL (ref 0.1–1.0)
Monocytes Relative: 16.4 % — ABNORMAL HIGH (ref 3.0–12.0)
Neutro Abs: 2.5 10*3/uL (ref 1.4–7.7)
Neutrophils Relative %: 39.6 % — ABNORMAL LOW (ref 43.0–77.0)
Platelets: 153 10*3/uL (ref 150.0–400.0)
RBC: 3.87 Mil/uL (ref 3.87–5.11)
RDW: 13.3 % (ref 11.5–15.5)
WBC: 6.3 10*3/uL (ref 4.0–10.5)

## 2023-03-25 LAB — VITAMIN D 25 HYDROXY (VIT D DEFICIENCY, FRACTURES): VITD: 90.92 ng/mL (ref 30.00–100.00)

## 2023-03-25 LAB — HEMOGLOBIN A1C: Hgb A1c MFr Bld: 5.6 % (ref 4.6–6.5)

## 2023-03-25 LAB — VITAMIN B12: Vitamin B-12: 647 pg/mL (ref 211–911)

## 2023-03-25 LAB — PHOSPHORUS: Phosphorus: 3.5 mg/dL (ref 2.3–4.6)

## 2023-03-25 LAB — TSH: TSH: 3.18 u[IU]/mL (ref 0.35–5.50)

## 2023-03-26 LAB — PARATHYROID HORMONE, INTACT (NO CA): PTH: 62 pg/mL (ref 16–77)

## 2023-04-01 ENCOUNTER — Ambulatory Visit: Payer: Medicare PPO | Admitting: Family Medicine

## 2023-04-01 ENCOUNTER — Other Ambulatory Visit: Payer: Self-pay | Admitting: Family Medicine

## 2023-04-01 ENCOUNTER — Encounter: Payer: Self-pay | Admitting: Family Medicine

## 2023-04-01 VITALS — BP 134/82 | HR 83 | Temp 98.2°F | Ht 66.5 in | Wt 202.0 lb

## 2023-04-01 DIAGNOSIS — C189 Malignant neoplasm of colon, unspecified: Secondary | ICD-10-CM

## 2023-04-01 DIAGNOSIS — E785 Hyperlipidemia, unspecified: Secondary | ICD-10-CM | POA: Diagnosis not present

## 2023-04-01 DIAGNOSIS — E66811 Obesity, class 1: Secondary | ICD-10-CM

## 2023-04-01 DIAGNOSIS — K219 Gastro-esophageal reflux disease without esophagitis: Secondary | ICD-10-CM

## 2023-04-01 DIAGNOSIS — F331 Major depressive disorder, recurrent, moderate: Secondary | ICD-10-CM | POA: Diagnosis not present

## 2023-04-01 DIAGNOSIS — R413 Other amnesia: Secondary | ICD-10-CM

## 2023-04-01 DIAGNOSIS — Z0001 Encounter for general adult medical examination with abnormal findings: Secondary | ICD-10-CM

## 2023-04-01 DIAGNOSIS — H9192 Unspecified hearing loss, left ear: Secondary | ICD-10-CM | POA: Diagnosis not present

## 2023-04-01 DIAGNOSIS — N1831 Chronic kidney disease, stage 3a: Secondary | ICD-10-CM | POA: Diagnosis not present

## 2023-04-01 DIAGNOSIS — E1169 Type 2 diabetes mellitus with other specified complication: Secondary | ICD-10-CM

## 2023-04-01 DIAGNOSIS — R2681 Unsteadiness on feet: Secondary | ICD-10-CM

## 2023-04-01 DIAGNOSIS — E039 Hypothyroidism, unspecified: Secondary | ICD-10-CM | POA: Diagnosis not present

## 2023-04-01 DIAGNOSIS — Z7189 Other specified counseling: Secondary | ICD-10-CM

## 2023-04-01 DIAGNOSIS — N3941 Urge incontinence: Secondary | ICD-10-CM

## 2023-04-01 DIAGNOSIS — Z0000 Encounter for general adult medical examination without abnormal findings: Secondary | ICD-10-CM

## 2023-04-01 DIAGNOSIS — H6191 Disorder of right external ear, unspecified: Secondary | ICD-10-CM

## 2023-04-01 DIAGNOSIS — Z7985 Long-term (current) use of injectable non-insulin antidiabetic drugs: Secondary | ICD-10-CM

## 2023-04-01 DIAGNOSIS — M85851 Other specified disorders of bone density and structure, right thigh: Secondary | ICD-10-CM

## 2023-04-01 DIAGNOSIS — R0609 Other forms of dyspnea: Secondary | ICD-10-CM

## 2023-04-01 DIAGNOSIS — D72821 Monocytosis (symptomatic): Secondary | ICD-10-CM

## 2023-04-01 DIAGNOSIS — H903 Sensorineural hearing loss, bilateral: Secondary | ICD-10-CM

## 2023-04-01 DIAGNOSIS — R42 Dizziness and giddiness: Secondary | ICD-10-CM | POA: Diagnosis not present

## 2023-04-01 MED ORDER — LEVOTHYROXINE SODIUM 25 MCG PO TABS
25.0000 ug | ORAL_TABLET | Freq: Every day | ORAL | 4 refills | Status: AC
Start: 2023-04-01 — End: ?

## 2023-04-01 MED ORDER — SERTRALINE HCL 100 MG PO TABS
100.0000 mg | ORAL_TABLET | Freq: Every day | ORAL | 4 refills | Status: AC
Start: 2023-04-01 — End: ?

## 2023-04-01 MED ORDER — LISINOPRIL 5 MG PO TABS: 5.0000 mg | ORAL_TABLET | Freq: Every day | ORAL | 3 refills | Status: DC

## 2023-04-01 MED ORDER — OZEMPIC (0.25 OR 0.5 MG/DOSE) 2 MG/3ML ~~LOC~~ SOPN: 0.5000 mg | PEN_INJECTOR | SUBCUTANEOUS | 3 refills | Status: DC

## 2023-04-01 MED ORDER — LANSOPRAZOLE 15 MG PO CPDR
15.0000 mg | DELAYED_RELEASE_CAPSULE | Freq: Every day | ORAL | 4 refills | Status: AC
Start: 2023-04-01 — End: ?

## 2023-04-01 MED ORDER — ATORVASTATIN CALCIUM 10 MG PO TABS
10.0000 mg | ORAL_TABLET | Freq: Every day | ORAL | 4 refills | Status: AC
Start: 2023-04-01 — End: ?

## 2023-04-01 MED ORDER — VITAMIN D3 25 MCG (1000 UT) PO CAPS: 1.0000 | ORAL_CAPSULE | Freq: Every day | ORAL | Status: AC

## 2023-04-01 NOTE — Assessment & Plan Note (Signed)

## 2023-04-01 NOTE — Telephone Encounter (Signed)
 Pt has OV today. Will send refill at this visit.

## 2023-04-01 NOTE — Progress Notes (Signed)
 Ph: [phone] Fax: [phone]   Patient ID: Lisa Osborne, female    DOB: [birth year], 83 y.o.   MRN: [id number]  This visit was conducted in person.  BP 134/82   Pulse 83   Temp 98.2 F (36.8 C) (Oral)   Ht 5' 6.5" (1.689 m)   Wt 202 lb (91.6 kg)   SpO2 96%   BMI 32.12 kg/m   BP Readings from Last 3 Encounters:  04/01/23 134/82  11/12/22 124/74  05/17/22 126/64   CC: AMW/CPE Subjective:   HPI: Lisa Osborne is a 83 y.o. female presenting on 04/01/2023 for Medicare Wellness   Did not see health advisor this year.  Hearing Screening - Comments:: Heard practice tone at 40 dBHL in R ear.   Wears R hearing aid. Not wearing at today's OV. State she's completely deaf in L ear.  Vision Screening - Comments:: Last eye exam, fall 2024.  Flowsheet Row Office Visit from 04/01/2023 in [facility] [place]  PHQ-2 Total Score 0          04/01/2023    2:29 PM 11/12/2022    9:26 AM 05/17/2022    4:16 PM 03/30/2022    3:03 PM 12/24/2021    2:33 PM  Fall Risk   Falls in the past year? 1 0 0 0 1  Number falls in past yr: 0    0  Injury with Fall? 0    0  Risk for fall due to :     History of fall(s)  Follow up     Falls prevention discussed;Falls evaluation completed  6 months ago fell in bathtub (slipped off bath seat). Took her 30 min to get up. Fortunately no injury.   Notes progressive trouble with memory over the past year - mostly nouns - persons and things.   Progressive exertional dyspnea 2023 - s/p reassuring cardiology evaluation with echocardiogram (aortic sclerosis without stenosis) and PFTs.   Ongoing vertigo and imbalance, worse when turning to the right. Chronic issue for years.  Chronic L ear hearing loss since age 60s.  She has R sided hearing aide but doesn't wear this due to high feedback.  Saw [facility] ENT who recommended head imaging - brain MRI 04/2022 without acute process or cause for hearing los.  Saw  audiologist for decreased hearing.   Obesity - 40 lb weight loss since starting ozempic as well as working on diet.  DM - continues ozempic 0.5mg  weekly.  She recently started protein shakes that her daughter recommended.  Painful lesion to top of R ear present for the past month. She sleeps on right side.   Notes increased edginess, nervousness and anxiety of unclear cause despite sertraline.   Monocytosis - mild elevation started 2021. Notes drenching night sweats for years. Notes some fatigue "low energy". No swollen glands. No unexpected weight changes, fevers/chills, bone pain.   Preventative: Colon cancer s/p lap LAR 03/2012 - clinical remission followed by Dr Truett Perna ONC. COLONOSCOPY Date: 03/2013 2 polyps, mild diverticulosis, rpt 3 yrs Arlyce Dice) COLONOSCOPY 03/2016 5 TAs, fair prep, patent anastomosis, rpt 6-12 mo ([facility]) COLONOSCOPY 01/2017 diverticulosis, healthy anastomosis, rpt 5 yrs (Armbruster)  Colonoscopy 09/2020 - TA x10, int hem, consider rpt 3 yrs (Armbruster)  Well woman - s/p total hysterectomy 1985, ovaries removed as well.  Mammogram 03/2022 - Bir[facility] @ [facility], upcoming already scheduled DEXA 06/2022 - T -1.7 RFN, at increased 109yr fracture risk (3.5% hip fracture) - discussed good  dietary calcium intake as well as vitamin D 16109 units daily.  Lung cancer screening - not eligible  Flu yearly COVID vaccine - Pfizer 02/2019, 03/2019, no booster but planning to get bivalent one Tetanus - 2010 Pneumovax 2008, prevnar-13 2015 Shingrix - 01/2021, 03/2021  Advanced directive: Received, scanned 2019. HCPOA are daughter Victorino Dike or SIL Genia Harold. Does not want prolonged life support if terminal condition but does want to receive artifical nutrition and hydration. If advanced dementia, would grant discretion to [facility]. Ok for organ donor. Wants to be cremated.  Seat belt use discussed Sunscreen use discussed. notes new spots on legs. Has not seen dermatology  Ex smoker  quit 1969 Alcohol - occasional Dentist - Q6 mo - due for this. She recently chipped L upper frontal tooth.  Eye exam - yearly Bowels - no constipation  Bladder - stable period - she stopped oxybutynin   Caffeine: 1 cup coffee/day Lives alone. 1 daughter Victorino Dike) Occupation: retired, was Air traffic controller. Activity: walking and swimming  Diet: good water, fruits/vegetables     Relevant past medical, surgical, family and social history reviewed and updated as indicated. Interim medical history since our last visit reviewed. Allergies and medications reviewed and updated. Outpatient Medications Prior to Visit  Medication Sig Dispense Refill   Calcium Citrate-Vitamin D (CALCIUM CITRATE + D PO) Take 1 tablet by mouth daily.      Coenzyme Q10 (COQ10) 200 MG CAPS Take 1 capsule by mouth daily.      Ferrous Sulfate (IRON) 325 (65 Fe) MG TABS Take by mouth daily.     fluconazole (DIFLUCAN) 150 MG tablet Take 1 tablet (150 mg total) by mouth once a week. 2 tablet 0   loratadine (CLARITIN) 10 MG tablet Take 10 mg by mouth daily as needed for allergies. Reported on 03/17/2015     Melatonin 5 MG TABS Take 5 mg by mouth at bedtime.      Omega-3 Fatty Acids (FISH OIL) 1000 MG CAPS Take 1 capsule by mouth daily.     vitamin B-12 (CYANOCOBALAMIN) 1000 MCG tablet Take 1,000 mcg by mouth daily.     atorvastatin (LIPITOR) 10 MG tablet TAKE 1 TABLET BY MOUTH EVERY DAY 90 tablet 4   lansoprazole (PREVACID) 15 MG capsule Take 1 capsule (15 mg total) by mouth daily. 90 capsule 4   levothyroxine (SYNTHROID) 25 MCG tablet Take 1 tablet (25 mcg total) by mouth daily. 90 tablet 4   oxybutynin (DITROPAN) 5 MG tablet Take 1 tablet (5 mg total) by mouth 2 (two) times daily as needed for bladder spasms. 40 tablet 3   Semaglutide,0.25 or 0.5MG /DOS, (OZEMPIC, 0.25 OR 0.5 MG/DOSE,) 2 MG/3ML SOPN INJECT 0.5 MG INTO THE SKIN ONE TIME PER WEEK 2 mL 3   sertraline (ZOLOFT) 100 MG tablet TAKE 1 TABLET BY MOUTH EVERY DAY 90 tablet  4   APPLE CIDER VINEGAR PO Take 1 capsule by mouth daily.     clotrimazole (LOTRIMIN AF) 1 % cream Apply 1 Application topically 2 (two) times daily. 80 g 0   CRANBERRY PO Take by mouth.     nystatin cream (MYCOSTATIN) Apply 1 Application topically 2 (two) times daily. 30 g 1   No facility-administered medications prior to visit.     Per HPI unless specifically indicated in ROS section below Review of Systems  Constitutional:  Negative for activity change, appetite change, chills, fatigue, fever and unexpected weight change.  HENT:  Negative for hearing loss.   Eyes:  Negative  for visual disturbance.  Respiratory:  Positive for shortness of breath (chronic exertional - s/p reassuring cardiac and pulm eval 2023). Negative for cough, chest tightness and wheezing.   Cardiovascular:  Positive for palpitations (declines zio heart monitor). Negative for chest pain and leg swelling.  Gastrointestinal:  Negative for abdominal distention, abdominal pain, blood in stool, constipation, diarrhea, nausea and vomiting.  Endocrine: Positive for cold intolerance. Negative for heat intolerance.  Genitourinary:  Negative for difficulty urinating and hematuria.  Musculoskeletal:  Negative for arthralgias, myalgias and neck pain.  Skin:  Negative for rash.  Neurological:  Positive for dizziness. Negative for seizures, syncope and headaches.  Hematological:  Negative for adenopathy. Does not bruise/bleed easily.  Psychiatric/Behavioral:  Positive for dysphoric mood. The patient is not nervous/anxious.     Objective:  BP 134/82   Pulse 83   Temp 98.2 F (36.8 C) (Oral)   Ht 5' 6.5" (1.689 m)   Wt 202 lb (91.6 kg)   SpO2 96%   BMI 32.12 kg/m   Wt Readings from Last 3 Encounters:  04/01/23 202 lb (91.6 kg)  11/12/22 219 lb 2 oz (99.4 kg)  05/17/22 221 lb 6 oz (100.4 kg)      Physical Exam Vitals and nursing note reviewed.  Constitutional:      Appearance: Normal appearance. She is not  ill-appearing.  HENT:     Head: Normocephalic and atraumatic.     Right Ear: Tympanic membrane and ear canal normal. There is no impacted cerumen.     Left Ear: Tympanic membrane, ear canal and external ear normal. There is no impacted cerumen.     Ears:      Comments: Hard tender nodular lesion to R superior pinna    Mouth/Throat:     Mouth: Mucous membranes are moist.     Pharynx: Oropharynx is clear. No oropharyngeal exudate or posterior oropharyngeal erythema.  Eyes:     General:        Right eye: No discharge.        Left eye: No discharge.     Extraocular Movements: Extraocular movements intact.     Conjunctiva/sclera: Conjunctivae normal.     Pupils: Pupils are equal, round, and reactive to light.  Neck:     Thyroid: No thyroid mass or thyromegaly.  Cardiovascular:     Rate and Rhythm: Normal rate and regular rhythm.     Pulses: Normal pulses.     Heart sounds: Normal heart sounds. No murmur heard. Pulmonary:     Effort: Pulmonary effort is normal. No respiratory distress.     Breath sounds: Normal breath sounds. No wheezing, rhonchi or rales.  Abdominal:     General: Bowel sounds are normal. There is no distension.     Palpations: Abdomen is soft. There is no mass.     Tenderness: There is no abdominal tenderness. There is no guarding or rebound.     Hernia: No hernia is present.  Musculoskeletal:     Cervical back: Normal range of motion and neck supple. No rigidity.     Right lower leg: No edema.     Left lower leg: No edema.  Lymphadenopathy:     Cervical: No cervical adenopathy.  Skin:    General: Skin is warm and dry.     Findings: No rash.  Neurological:     General: No focal deficit present.     Mental Status: She is alert. Mental status is at baseline.     Comments:  Recall 3/3 Calculation 5/5 DLROW  Psychiatric:        Mood and Affect: Mood normal.        Behavior: Behavior normal.       Results for orders placed or performed in visit on 03/25/23   Vitamin B12   Collection Time: 03/25/23  8:27 AM  Result Value Ref Range   Vitamin B-12 647 211 - 911 pg/mL  TSH   Collection Time: 03/25/23  8:27 AM  Result Value Ref Range   TSH 3.18 0.35 - 5.50 uIU/mL  CBC with Differential/Platelet   Collection Time: 03/25/23  8:27 AM  Result Value Ref Range   WBC 6.3 4.0 - 10.5 K/uL   RBC 3.87 3.87 - 5.11 Mil/uL   Hemoglobin 12.7 12.0 - 15.0 g/dL   HCT 13.0 86.5 - 78.4 %   MCV 97.6 78.0 - 100.0 fl   MCHC 33.5 30.0 - 36.0 g/dL   RDW 69.6 29.5 - 28.4 %   Platelets 153.0 150.0 - 400.0 K/uL   Neutrophils Relative % 39.6 (L) 43.0 - 77.0 %   Lymphocytes Relative 43.2 12.0 - 46.0 %   Monocytes Relative 16.4 (H) 3.0 - 12.0 %   Eosinophils Relative 0.1 0.0 - 5.0 %   Basophils Relative 0.7 0.0 - 3.0 %   Neutro Abs 2.5 1.4 - 7.7 K/uL   Lymphs Abs 2.7 0.7 - 4.0 K/uL   Monocytes Absolute 1.0 0.1 - 1.0 K/uL   Eosinophils Absolute 0.0 0.0 - 0.7 K/uL   Basophils Absolute 0.0 0.0 - 0.1 K/uL  Parathyroid hormone, intact (no Ca)   Collection Time: 03/25/23  8:27 AM  Result Value Ref Range   PTH 62 16 - 77 pg/mL  Microalbumin / creatinine urine ratio   Collection Time: 03/25/23  8:27 AM  Result Value Ref Range   Microalb, Ur 8.8 (H) 0.0 - 1.9 mg/dL   Creatinine,U 132.4 mg/dL   Microalb Creat Ratio 38.9 (H) 0.0 - 30.0 mg/g  VITAMIN D 25 Hydroxy (Vit-D Deficiency, Fractures)   Collection Time: 03/25/23  8:27 AM  Result Value Ref Range   VITD 90.92 30.00 - 100.00 ng/mL  Phosphorus   Collection Time: 03/25/23  8:27 AM  Result Value Ref Range   Phosphorus 3.5 2.3 - 4.6 mg/dL  Comprehensive metabolic panel   Collection Time: 03/25/23  8:27 AM  Result Value Ref Range   Sodium 141 135 - 145 mEq/L   Potassium 4.0 3.5 - 5.1 mEq/L   Chloride 102 96 - 112 mEq/L   CO2 29 19 - 32 mEq/L   Glucose, Bld 109 (H) 70 - 99 mg/dL   BUN 21 6 - 23 mg/dL   Creatinine, Ser 4.01 0.40 - 1.20 mg/dL   Total Bilirubin 0.5 0.2 - 1.2 mg/dL   Alkaline Phosphatase 59 39 -  117 U/L   AST 17 0 - 37 U/L   ALT 9 0 - 35 U/L   Total Protein 7.3 6.0 - 8.3 g/dL   Albumin 4.4 3.5 - 5.2 g/dL   GFR 02.72 (L) >53.66 mL/min   Calcium 9.5 8.4 - 10.5 mg/dL  Lipid panel   Collection Time: 03/25/23  8:27 AM  Result Value Ref Range   Cholesterol 146 0 - 200 mg/dL   Triglycerides 440.3 0.0 - 149.0 mg/dL   HDL 47.42 >59.56 mg/dL   VLDL 38.7 0.0 - 56.4 mg/dL   LDL Cholesterol 62 0 - 99 mg/dL   Total CHOL/HDL Ratio 3    NonHDL  89.28   Hemoglobin A1c   Collection Time: 03/25/23  8:27 AM  Result Value Ref Range   Hgb A1c MFr Bld 5.6 4.6 - 6.5 %      04/01/2023    2:29 PM 11/12/2022    9:26 AM 05/17/2022    4:16 PM 03/30/2022    3:03 PM 12/24/2021    2:31 PM  Depression screen PHQ 2/9  Decreased Interest 0 0 0 1 0  Down, Depressed, Hopeless 0 0 0 0 0  PHQ - 2 Score 0 0 0 1 0  Altered sleeping 1 2 3 2  0  Tired, decreased energy 1 0 3 2 0  Change in appetite 0 0 3 2 0  Feeling bad or failure about yourself  0 0 0 0 0  Trouble concentrating 0 0 2 0 0  Moving slowly or fidgety/restless 0 0 0 1 0  Suicidal thoughts 0 0 0 0 0  PHQ-9 Score 2 2 11 8  0  Difficult doing work/chores Not difficult at all Somewhat difficult Somewhat difficult Somewhat difficult Not difficult at all       04/01/2023    2:29 PM 11/12/2022    9:27 AM 05/17/2022    4:16 PM 03/30/2022    3:03 PM  GAD 7 : Generalized Anxiety Score  Nervous, Anxious, on Edge 1 0 3 1  Control/stop worrying 1 0 3 1  Worry too much - different things 1 0 3 1  Trouble relaxing 0 0 2 0  Restless 0 0 0 0  Easily annoyed or irritable 0 0 0 0  Afraid - awful might happen 0 0 0 0  Total GAD 7 Score 3 0 11 3  Anxiety Difficulty Somewhat difficult  Somewhat difficult Not difficult at all   Assessment & Plan:   Problem List Items Addressed This Visit     Medicare annual wellness visit, subsequent - Primary (Chronic)   I have personally reviewed the Medicare Annual Wellness questionnaire and have noted 1. The patient's  medical and social history 2. Their use of alcohol, tobacco or illicit drugs 3. Their current medications and supplements 4. The patient's functional ability including ADL's, fall risks, home safety risks and hearing or visual impairment. Cognitive function has been assessed and addressed as indicated.  5. Diet and physical activity 6. Evidence for depression or mood disorders The patients weight, height, BMI have been recorded in the chart. I have made referrals, counseling and provided education to the patient based on review of the above and I have provided the pt with a written personalized care plan for preventive services. Provider list updated.. See scanned questionairre as needed for further documentation. Reviewed preventative protocols and updated unless pt declined.       Encounter for general adult medical examination with abnormal findings (Chronic)   Preventative protocols reviewed and updated unless pt declined. Discussed healthy diet and lifestyle.       Advanced care planning/counseling discussion (Chronic)   Previously discussed      MDD (major depressive disorder), recurrent episode, moderate (HCC)   Chronic, but not well controlled despite sertraline 100mg  daily.  Overall stable PHQ9/GAD7 scores.  Consider counseling, SNRI vs adjuvant med - will further evaluate at next visit.  For now, continue sertraline 100mg  daily.       Relevant Medications   sertraline (ZOLOFT) 100 MG tablet   Type 2 diabetes mellitus with other specified complication (HCC)   Chronic great control on ozempic 0.5mg  weekly with ongoing  weight loss.       Relevant Medications   atorvastatin (LIPITOR) 10 MG tablet   Semaglutide,0.25 or 0.5MG /DOS, (OZEMPIC, 0.25 OR 0.5 MG/DOSE,) 2 MG/3ML SOPN   lisinopril (ZESTRIL) 5 MG tablet   GERD (gastroesophageal reflux disease)   Chronic, stable on prevacid 15mg  daily.       Relevant Medications   lansoprazole (PREVACID) 15 MG capsule    Hypothyroidism   Chronic, stable period on levothyroxine 125 mcg daily       Relevant Medications   levothyroxine (SYNTHROID) 25 MCG tablet   Urge incontinence   Chronic, stable period off oxybutynin - stay off this with new cognitive concerns.  Previously myrbetriq was effective but unaffordable.       Adenocarcinoma of colon (HCC)   Overall stable period. Consider updated colonoscopy 09/2023 in h/o colon cancer.       Obesity, Class I, BMI 30-34.9   Congratulated on weight loss on ozempic and with implemented healthy diet changes.       HLD (hyperlipidemia)   Chronic, stable on low dose atorvastatin 10mg  - continue. The ASCVD Risk score (Arnett DK, et al., 2019) failed to calculate for the following reasons:   The 2019 ASCVD risk score is only valid for ages 17 to 19       Relevant Medications   atorvastatin (LIPITOR) 10 MG tablet   lisinopril (ZESTRIL) 5 MG tablet   Acquired deafness of left ear   Chronic, longstanding L ear hearing loss with acquired R hearing loss - continue hearing aide use.       Chronic vertigo   Chronic issue, with asymmetric hearing loss.  Will refer to vestibular physical therapy.  Last saw ENT/audiology 11/2021.       Relevant Orders   Ambulatory referral to Physical Therapy   CKD (chronic kidney disease) stage 3, GFR 30-59 ml/min (HCC)   Chronic, GFR dropped from 50s to 45. New microalbuminuria present.  Start lisinopril 5mg  daily for reno-protection. Reviewed side effects and allergy to watch for.  Consider SGLT2i.  H/o elevated PTH thought secondary to renal disease, but most recently levels back to normal range.       General unsteadiness   Refer to vestibular therapy for chronic vertigo and unsteadiness presumed due to this.       Relevant Orders   Ambulatory referral to Physical Therapy   Exertional dyspnea   Chronic, longstanding. S/p reassuring cardiac evaluation as well as reassuring PFTs 02/2021.       Asymmetric SNHL  (sensorineural hearing loss)   Chronic L>R. Wears hearing aide to R ear.       Monocytosis   Chronic, persistently mildly elevated, now endorses longstanding night sweats - will refer to hematology for evaluation.       Relevant Orders   Ambulatory referral to Hematology / Oncology   Osteopenia   Recent DEXA reviewed - osteopenia range T score however at increased fracture risk based on elevated FRAX score (3.5% hip fracture risk). Continue dietary calcium intake, regular vitamin D 1000 units, consider bone strengthening medicine.       Earlobe lesion, right   Anticipate chondrodermatitis nodularis helicis to right ear. Recommend cushioning. Refer to derm for eval.       Relevant Orders   Ambulatory referral to Dermatology   Memory deficit   She notes worsening memory trouble - remembering names of people and things.  Reviewed 4 core strategies to support a healthy mind as per instructions.  Return in  3 months for formal geriatric assessment.         Meds ordered this encounter  Medications   lansoprazole (PREVACID) 15 MG capsule    Sig: Take 1 capsule (15 mg total) by mouth daily.    Dispense:  90 capsule    Refill:  4   atorvastatin (LIPITOR) 10 MG tablet    Sig: Take 1 tablet (10 mg total) by mouth daily.    Dispense:  90 tablet    Refill:  4   levothyroxine (SYNTHROID) 25 MCG tablet    Sig: Take 1 tablet (25 mcg total) by mouth daily.    Dispense:  90 tablet    Refill:  4   Semaglutide,0.25 or 0.5MG /DOS, (OZEMPIC, 0.25 OR 0.5 MG/DOSE,) 2 MG/3ML SOPN    Sig: Inject 0.5 mg as directed once a week.    Dispense:  6 mL    Refill:  3   sertraline (ZOLOFT) 100 MG tablet    Sig: Take 1 tablet (100 mg total) by mouth daily.    Dispense:  90 tablet    Refill:  4   Cholecalciferol (VITAMIN D3) 25 MCG (1000 UT) CAPS    Sig: Take 1 capsule (1,000 Units total) by mouth daily.   lisinopril (ZESTRIL) 5 MG tablet    Sig: Take 1 tablet (5 mg total) by mouth daily.    Dispense:   90 tablet    Refill:  3    Orders Placed This Encounter  Procedures   Ambulatory referral to Dermatology    Referral Priority:   Routine    Referral Type:   Consultation    Referral Reason:   Specialty Services Required    Requested Specialty:   Dermatology    Number of Visits Requested:   1   Ambulatory referral to Hematology / Oncology    Referral Priority:   Routine    Referral Type:   Consultation    Referral Reason:   Specialty Services Required    Requested Specialty:   Oncology    Number of Visits Requested:   1   Ambulatory referral to Physical Therapy    Referral Priority:   Routine    Referral Type:   Physical Medicine    Referral Reason:   Specialty Services Required    Requested Specialty:   Physical Therapy    Number of Visits Requested:   1    Patient Instructions  We will refer you to blood doctor.  Will refer to dermatology for painful ear lesion - see handout.  We will refer you to vestibular physical therapy in [facility] for ongoing imbalance.  In chronic kidney disease with increasing protein in the urine - start lisinopril 5mg  daily in the morning - for kidney protection. Watch for allergy to medicine - tongue/lip swelling. Or side effect of - dry nagging cough tickle in back of throat.  Good to see you today Return in 3 months for memory testing  Reviewed 4 core lifestyle modifications to support a healthy mind:  1. Nutritious well balance diet.  2. Regular physical activity routine.  3. Regular mental activity such as reading books, word puzzles, math puzzles, jigsaw puzzles.  4. Social engagement.  Also ensure good blood pressure control, limit alcohol, no smoking.    Follow up plan: Return in about 3 months (around 07/02/2023) for follow up visit.  Eustaquio Boyden, MD

## 2023-04-01 NOTE — Assessment & Plan Note (Addendum)
 Previously discussed.

## 2023-04-01 NOTE — Patient Instructions (Addendum)
 We will refer you to blood doctor.  Will refer to dermatology for painful ear lesion - see handout.  We will refer you to vestibular physical therapy in [facility] for ongoing imbalance.  In chronic kidney disease with increasing protein in the urine - start lisinopril 5mg  daily in the morning - for kidney protection. Watch for allergy to medicine - tongue/lip swelling. Or side effect of - dry nagging cough tickle in back of throat.  Good to see you today Return in 3 months for memory testing  Reviewed 4 core lifestyle modifications to support a healthy mind:  1. Nutritious well balance diet.  2. Regular physical activity routine.  3. Regular mental activity such as reading books, word puzzles, math puzzles, jigsaw puzzles.  4. Social engagement.  Also ensure good blood pressure control, limit alcohol, no smoking.

## 2023-04-01 NOTE — Assessment & Plan Note (Signed)
 Preventative protocols reviewed and updated unless pt declined. Discussed healthy diet and lifestyle.

## 2023-04-02 ENCOUNTER — Encounter: Payer: Self-pay | Admitting: Family Medicine

## 2023-04-02 DIAGNOSIS — R413 Other amnesia: Secondary | ICD-10-CM | POA: Insufficient documentation

## 2023-04-02 DIAGNOSIS — H6191 Disorder of right external ear, unspecified: Secondary | ICD-10-CM | POA: Insufficient documentation

## 2023-04-02 NOTE — Assessment & Plan Note (Signed)
 Overall stable period. Consider updated colonoscopy 09/2023 in h/o colon cancer.

## 2023-04-02 NOTE — Assessment & Plan Note (Addendum)
 Chronic issue, with asymmetric hearing loss.  Will refer to vestibular physical therapy.  Last saw ENT/audiology 11/2021.

## 2023-04-02 NOTE — Assessment & Plan Note (Signed)
 Recent DEXA reviewed - osteopenia range T score however at increased fracture risk based on elevated FRAX score (3.5% hip fracture risk). Continue dietary calcium intake, regular vitamin D 1000 units, consider bone strengthening medicine.

## 2023-04-02 NOTE — Assessment & Plan Note (Addendum)
 Chronic, stable period off oxybutynin - stay off this with new cognitive concerns.  Previously myrbetriq was effective but unaffordable.

## 2023-04-02 NOTE — Assessment & Plan Note (Signed)
 Anticipate chondrodermatitis nodularis helicis to right ear. Recommend cushioning. Refer to derm for eval.

## 2023-04-02 NOTE — Assessment & Plan Note (Addendum)
 Chronic, stable on low dose atorvastatin 10mg  - continue. The ASCVD Risk score (Arnett DK, et al., 2019) failed to calculate for the following reasons:   The 2019 ASCVD risk score is only valid for ages 71 to 7

## 2023-04-02 NOTE — Assessment & Plan Note (Addendum)
 Chronic, GFR dropped from 50s to 45. New microalbuminuria present.  Start lisinopril 5mg  daily for reno-protection. Reviewed side effects and allergy to watch for.  Consider SGLT2i.  H/o elevated PTH thought secondary to renal disease, but most recently levels back to normal range.

## 2023-04-02 NOTE — Assessment & Plan Note (Signed)
 Chronic L>R. Wears hearing aide to R ear.

## 2023-04-02 NOTE — Assessment & Plan Note (Signed)
 Chronic, stable on prevacid 15mg  daily.

## 2023-04-02 NOTE — Assessment & Plan Note (Signed)
 She notes worsening memory trouble - remembering names of people and things.  Reviewed 4 core strategies to support a healthy mind as per instructions.  Return in 3 months for formal geriatric assessment.

## 2023-04-02 NOTE — Assessment & Plan Note (Signed)
 Chronic great control on ozempic 0.5mg  weekly with ongoing weight loss.

## 2023-04-02 NOTE — Assessment & Plan Note (Addendum)
 Chronic, longstanding. S/p reassuring cardiac evaluation as well as reassuring PFTs 02/2021.

## 2023-04-02 NOTE — Assessment & Plan Note (Signed)
 Chronic, stable period on levothyroxine 125 mcg daily

## 2023-04-02 NOTE — Assessment & Plan Note (Signed)
 Refer to vestibular therapy for chronic vertigo and unsteadiness presumed due to this.

## 2023-04-02 NOTE — Assessment & Plan Note (Addendum)
 Chronic, persistently mildly elevated, now endorses longstanding night sweats - will refer to hematology for evaluation.

## 2023-04-02 NOTE — Assessment & Plan Note (Signed)
 Congratulated on weight loss on ozempic and with implemented healthy diet changes.

## 2023-04-02 NOTE — Assessment & Plan Note (Addendum)
 Chronic, but not well controlled despite sertraline 100mg  daily.  Overall stable PHQ9/GAD7 scores.  Consider counseling, SNRI vs adjuvant med - will further evaluate at next visit.  For now, continue sertraline 100mg  daily.

## 2023-04-02 NOTE — Assessment & Plan Note (Addendum)
 Chronic, longstanding L ear hearing loss with acquired R hearing loss - continue hearing aide use.

## 2023-04-04 ENCOUNTER — Other Ambulatory Visit: Payer: Self-pay | Admitting: Family Medicine

## 2023-04-04 DIAGNOSIS — Z1231 Encounter for screening mammogram for malignant neoplasm of breast: Secondary | ICD-10-CM

## 2023-04-11 ENCOUNTER — Encounter: Admitting: Physical Therapy

## 2023-04-12 ENCOUNTER — Ambulatory Visit
Admission: RE | Admit: 2023-04-12 | Discharge: 2023-04-12 | Disposition: A | Discharge status: Home / Self Care | Source: Ambulatory Visit | Attending: Family Medicine | Admitting: Family Medicine

## 2023-04-12 DIAGNOSIS — Z1231 Encounter for screening mammogram for malignant neoplasm of breast: Secondary | ICD-10-CM | POA: Insufficient documentation

## 2023-04-19 ENCOUNTER — Inpatient Hospital Stay: Attending: Oncology | Admitting: Oncology

## 2023-04-19 ENCOUNTER — Encounter: Payer: Self-pay | Admitting: Oncology

## 2023-04-19 ENCOUNTER — Inpatient Hospital Stay

## 2023-04-19 VITALS — BP 125/76 | HR 84 | Temp 98.0°F | Resp 18 | Ht 66.5 in | Wt 203.6 lb

## 2023-04-19 DIAGNOSIS — Z87891 Personal history of nicotine dependence: Secondary | ICD-10-CM | POA: Diagnosis not present

## 2023-04-19 DIAGNOSIS — D72821 Monocytosis (symptomatic): Secondary | ICD-10-CM | POA: Insufficient documentation

## 2023-04-19 DIAGNOSIS — E785 Hyperlipidemia, unspecified: Secondary | ICD-10-CM | POA: Insufficient documentation

## 2023-04-19 DIAGNOSIS — Z85048 Personal history of other malignant neoplasm of rectum, rectosigmoid junction, and anus: Secondary | ICD-10-CM | POA: Insufficient documentation

## 2023-04-19 DIAGNOSIS — Z9221 Personal history of antineoplastic chemotherapy: Secondary | ICD-10-CM | POA: Insufficient documentation

## 2023-04-19 DIAGNOSIS — Z7985 Long-term (current) use of injectable non-insulin antidiabetic drugs: Secondary | ICD-10-CM | POA: Diagnosis not present

## 2023-04-19 DIAGNOSIS — R634 Abnormal weight loss: Secondary | ICD-10-CM | POA: Diagnosis not present

## 2023-04-19 DIAGNOSIS — Z9049 Acquired absence of other specified parts of digestive tract: Secondary | ICD-10-CM | POA: Insufficient documentation

## 2023-04-19 DIAGNOSIS — Z85038 Personal history of other malignant neoplasm of large intestine: Secondary | ICD-10-CM | POA: Diagnosis not present

## 2023-04-19 LAB — CBC WITH DIFFERENTIAL (CANCER CENTER ONLY)
Abs Immature Granulocytes: 0.04 10*3/uL (ref 0.00–0.07)
Basophils Absolute: 0 10*3/uL (ref 0.0–0.1)
Basophils Relative: 0 %
Eosinophils Absolute: 0 10*3/uL (ref 0.0–0.5)
Eosinophils Relative: 0 %
HCT: 36.3 % (ref 36.0–46.0)
Hemoglobin: 12.5 g/dL (ref 12.0–15.0)
Immature Granulocytes: 1 %
Lymphocytes Relative: 38 %
Lymphs Abs: 2.1 10*3/uL (ref 0.7–4.0)
MCH: 33.2 pg (ref 26.0–34.0)
MCHC: 34.4 g/dL (ref 30.0–36.0)
MCV: 96.3 fL (ref 80.0–100.0)
Monocytes Absolute: 1 10*3/uL (ref 0.1–1.0)
Monocytes Relative: 18 %
Neutro Abs: 2.3 10*3/uL (ref 1.7–7.7)
Neutrophils Relative %: 43 %
Platelet Count: 157 10*3/uL (ref 150–400)
RBC: 3.77 MIL/uL — ABNORMAL LOW (ref 3.87–5.11)
RDW: 12.3 % (ref 11.5–15.5)
WBC Count: 5.4 10*3/uL (ref 4.0–10.5)
nRBC: 0 % (ref 0.0–0.2)

## 2023-04-19 LAB — CMP (CANCER CENTER ONLY)
ALT: 6 U/L (ref 0–44)
AST: 16 U/L (ref 15–41)
Albumin: 4.6 g/dL (ref 3.5–5.0)
Alkaline Phosphatase: 59 U/L (ref 38–126)
Anion gap: 8 (ref 5–15)
BUN: 18 mg/dL (ref 8–23)
CO2: 30 mmol/L (ref 22–32)
Calcium: 9.7 mg/dL (ref 8.9–10.3)
Chloride: 101 mmol/L (ref 98–111)
Creatinine: 1.17 mg/dL — ABNORMAL HIGH (ref 0.44–1.00)
GFR, Estimated: 47 mL/min — ABNORMAL LOW (ref >60)
Glucose, Bld: 121 mg/dL — ABNORMAL HIGH (ref 70–99)
Potassium: 3.8 mmol/L (ref 3.5–5.1)
Sodium: 139 mmol/L (ref 135–145)
Total Bilirubin: 0.5 mg/dL (ref 0.0–1.2)
Total Protein: 7.5 g/dL (ref 6.5–8.1)

## 2023-04-19 LAB — CEA (ACCESS): CEA (CHCC): 2.92 ng/mL (ref 0.00–5.00)

## 2023-04-19 LAB — C-REACTIVE PROTEIN: CRP: 0.7 mg/dL (ref <1.0)

## 2023-04-19 LAB — TSH: TSH: 2.421 u[IU]/mL (ref 0.350–4.500)

## 2023-04-19 LAB — LACTATE DEHYDROGENASE: LDH: 138 U/L (ref 98–192)

## 2023-04-19 LAB — SEDIMENTATION RATE: Sed Rate: 15 mm/h (ref 0–22)

## 2023-04-19 NOTE — Assessment & Plan Note (Addendum)
 Previously she was seen in our clinic by Dr. Truett Perna for her history of stage III B (T4 N1) (LN 1/15) adenocarcinoma of rectosigmoid colon, microsatellite stable, status post low anterior resection on 04/15/2022 followed by adjuvant chemotherapy with dose reduced Xeloda, completed in October 2014.  She was in clinical remission.  Last colonoscopy in 2019 showed no evidence of disease recurrence.  She was last seen by Dr. Truett Perna in September 2019 and was lost to follow-up after that.  She reports having regular checks since her cancer diagnosis and is scheduled for another colonoscopy next year. The last colonoscopy was in 2019 and showed no concerning findings.   CEA normal today.  No clinical signs or symptoms/of disease recurrence.  No indication for routine imaging since the diagnosis was more than 10 years ago.

## 2023-04-19 NOTE — Progress Notes (Signed)
 [facility]  HEMATOLOGY CLINIC CONSULTATION NOTE   PATIENT NAME: Lisa Osborne   MR#: [id number] DOB: [birth year]  DATE OF SERVICE: 04/19/2023   REFERRING PHYSICIAN  Eustaquio Boyden, MD   Patient Care Team: Eustaquio Boyden, MD as PCP - General (Family Medicine) Wendall Stade, MD as PCP - Cardiology (Cardiology) Karie Soda, MD as Consulting Physician (General Surgery) Louis Meckel, MD (Inactive) as Consulting Physician (Gastroenterology) Cira Rue, RN as Registered Nurse (Oncology) Blair Promise, OD as Referring Physician (Optometry) Rana Snare, NP as Nurse Practitioner (Hematology and Oncology) Ladene Artist, MD as Consulting Physician (Oncology)   REASON FOR CONSULTATION/ CHIEF COMPLAINT:  Evaluation of monocytosis  ASSESSMENT & PLAN:  Lisa Osborne is a 83 y.o. lady with a past medical history of stage III B (T4 N1) (LN 1/15) adenocarcinoma of rectosigmoid colon, microsatellite stable, status post low anterior resection on 04/15/2022 followed by adjuvant chemotherapy with dose reduced Xeloda, completed in October 2014, has been in remission, was referred to our service for evaluation of monocytosis.  Monocytosis Mild monocytosis with monocyte percentage at 16%, slightly above the normal range of 12%. Total monocyte count remains within normal limits, around 800-900, consistent since at least 2022.   No associated symptoms such as fever, chills, or night sweats.   Possible causes include inflammation or medication effects, but no new medications apart from lisinopril and recent discontinuation of Prevacid. No joint issues or pain reported.   Total white count, red count, and platelet count are normal.  Labs today showed normal white count of 5400 with normal differential.  Absolute monocyte count is 1000, within normal limits.  Hemoglobin and platelet count are within normal limits.  CMP showed creatinine of 1.17,  otherwise unremarkable.  CEA, TSH are within normal limits.  We did check BCR/ABL 1 and flow cytometry of peripheral blood to rule out any myeloproliferative neoplasm, although clinical picture seems to be less likely.  - Arrange a phone call visit in one week to discuss blood test results.  No additional hematological workup or intervention would be needed at current levels.  Will continue monitoring.  Plan to see her again in 3 months for follow-up with repeat labs.  If stable blood counts, she can be discharged from our office after next visit.  Hx of colon cancer, stage III Previously she was seen in our clinic by Dr. Truett Perna for her history of stage III B (T4 N1) (LN 1/15) adenocarcinoma of rectosigmoid colon, microsatellite stable, status post low anterior resection on 04/15/2022 followed by adjuvant chemotherapy with dose reduced Xeloda, completed in October 2014.  She was in clinical remission.  Last colonoscopy in 2019 showed no evidence of disease recurrence.  She was last seen by Dr. Truett Perna in September 2082 and was lost to follow-up after that.  She reports having regular checks since her cancer diagnosis and is scheduled for another colonoscopy next year. The last colonoscopy was in 2019 and showed no concerning findings.   CEA normal today.  No clinical signs or symptoms/of disease recurrence.  No indication for routine imaging since the diagnosis was more than 10 years ago.   I reviewed lab results and outside records for this visit and discussed relevant results with the patient. Diagnosis, plan of care and treatment options were also discussed in detail with the patient. Opportunity provided to ask questions and answers provided to her apparent satisfaction. Provided instructions to call our clinic with any problems, questions  or concerns prior to return visit. I recommended to continue follow-up with PCP and sub-specialists. She verbalized understanding and agreed with the plan. No  barriers to learning was detected.  Meryl Crutch, MD  04/19/2023 4:50 PM  [facility] - A DE[facility] [address] [place] [address] Dept: [phone] Dept Fax: [phone]   HISTORY OF PRESENT ILLNESS:  Discussed the use of AI scribe software for clinical note transcription with the patient, who gave verbal consent to proceed.   During her routine visit with her PCP on 03/25/2023, labs showed white count of 6300.  On the differential, monocytes were slightly elevated at 16.4%, with absolute monocyte count still within normal limits at 1000.  ANC was 2500.  Hemoglobin and platelet count were within normal limits.  Previously labs in January 2024 and November 2022 also showed slightly increased monocyte percentage at 16.9% and 17.7%.  Absolute monocyte count was 900 and 800 respectively.  Given this mild relative monocytosis, a referral was sent to [place] for further evaluation.  Previously she was seen in our clinic by Dr. Truett Perna for her history of stage III B (T4 N1) (LN 1/15) adenocarcinoma of rectosigmoid colon, microsatellite stable, status post low anterior resection on 04/15/2022 followed by adjuvant chemotherapy with dose reduced Xeloda, completed in October 2014.  She was in clinical remission.  Last colonoscopy in 2019 showed no evidence of disease recurrence.  She was last seen by Dr. Truett Perna in September 2082 and was lost to follow-up after that.  She reports having regular checks since her cancer diagnosis and is scheduled for another colonoscopy next year. The last colonoscopy was in 2019 and showed no concerning findings. The patient's primary care doctor referred her due to a slightly high monocyte count in her blood work. She denies any joint issues or pain. She has been started on lisinopril for kidney issues, which have been fluctuating recently. The patient also reports a significant weight loss  of forty-three pounds over the past three months due to taking Ozempic. She has not started any new medications recently, but she has stopped taking Prevacid for GERD on her daughter's advice and has increased her calcium intake. The patient denies any fevers, chills, or night sweats but reports feeling cold all the time, which she attributes to her weight loss.   MEDICAL HISTORY Past Medical History:  Diagnosis Date   Abnormal EKG    incomplete RBBB   Acquired deafness of left ear    Acute cholecystitis    Allergy    Anemia    Anxiety    Arthritis    left side lower back pain   Brown recluse spider bite 03/2018   Colon adenocarcinoma - rectosigmoid T3N1 (1/15) s/p lap LAR 04/14/2012   Status post laparoscopic lower anterior resection   Dehydration 02/2015   Depression    severe in past   Diabetes mellitus type 2, controlled (HCC) 2006   diet controlled   GERD (gastroesophageal reflux disease)    bad if off PPI   History of chicken pox    History of iron deficiency anemia    HLD (hyperlipidemia) 08/03/2015   Hypothyroidism    mild   Laceration of fifth toe, right 08/26/2017   Neuromuscular disorder (HCC)    neuropathy both great toes   Personal history of chemotherapy    Rosacea    Seasonal allergies    Unilateral deafnesses    left  ear deaf, wears hearing aid right side   Urine incontinence    mild, wears pad     SURGICAL HISTORY Past Surgical History:  Procedure Laterality Date   ABDOMINAL HYSTERECTOMY     Approx 1980?   ABDOMINOPLASTY  1996   CHOLECYSTECTOMY N/A 03/20/2015   Procedure: LAPAROSCOPIC CHOLECYSTECTOMY;  Surgeon: Lattie Haw, MD;  Location: [facility] ORS;  Service: General;  Laterality: N/A;   COLONOSCOPY  08/2006   normal   COLONOSCOPY  03/2013   2 polyps, mild diverticulosis, rpt 3 yrs Arlyce Dice)   COLONOSCOPY  03/2016   5 TAs, fair prep, patent anastomosis, rpt 6-12 mo (Armbruster)   COLONOSCOPY  01/2017   diverticulosis, healthy anastomosis,  rpt 5 yrs (Armbruster)   COLONOSCOPY  09/2020   TA x10, int hem, consider rpt 3 yrs (Armbruster)   FACELIFT  05/2011   INCONTINENCE SURGERY  1996   LAPAROSCOPIC LOW ANTERIOR RESECTION N/A 04/14/2012   colon cancer at rectosigmoid junction s/p rigid proctoscopy splenic flexure mobilization lysis of adhesion;  Surgeon: Ardeth Sportsman, MD;    MOUTH SURGERY     crown repair   TOTAL VAGINAL HYSTERECTOMY  1985   cancer cells on cervix     SOCIAL HISTORY: She reports that she quit smoking about 56 years ago. Her smoking use included cigarettes. She has never used smokeless tobacco. She reports current alcohol use of about 1.0 standard drink of alcohol per week. She reports that she does not use drugs. Social History   Socioeconomic History   Marital status: Single    Spouse name: Not on file   Number of children: 1   Years of education: Not on file   Highest education level: Not on file  Occupational History   Occupation: Retired Magazine features editor: RETIRED  Tobacco Use   Smoking status: Former    Current packs/day: 0.00    Types: Cigarettes    Quit date: 04/06/1967    Years since quitting: 56.0   Smokeless tobacco: Never  Vaping Use   Vaping status: Never Used  Substance and Sexual Activity   Alcohol use: Yes    Alcohol/week: 1.0 standard drink of alcohol    Types: 1 Shots of liquor per week    Comment: 1 per week   Drug use: Never   Sexual activity: Not Currently    Birth control/protection: Pill  Other Topics Concern   Not on file  Social History Narrative   Caffeine: 1 cup coffee/day   Lives alone.  1 daughter Victorino Dike), 3 stillborns   Occupation: retired, was Air traffic controller.   Activity: no regular activity but does park far away, has treadmill at home   Diet: good fruits/vegetables, more fruit/vegetable smoothies  good amt water   Social Drivers of Health   Financial Resource Strain: Low Risk  (12/24/2021)   Overall Financial Resource Strain (CARDIA)    Difficulty  of Paying Living Expenses: Not hard at all  Food Insecurity: No Food Insecurity (04/19/2023)   Hunger Vital Sign    Worried About Running Out of Food in the Last Year: Never true    Ran Out of Food in the Last Year: Never true  Transportation Needs: No Transportation Needs (04/19/2023)   PRAPARE - Administrator, Civil Service (Medical): No    Lack of Transportation (Non-Medical): No  Physical Activity: Insufficiently Active (12/24/2021)   Exercise Vital Sign    Days of Exercise per Week: 2 days    Minutes  of Exercise per Session: 30 min  Stress: No Stress Concern Present (12/24/2021)   [employer] of Occupational Health - Occupational Stress Questionnaire    Feeling of Stress : Only a little  Social Connections: Socially Isolated (12/24/2021)   Social Connection and Isolation Panel [NHANES]    Frequency of Communication with Friends and Family: More than three times a week    Frequency of Social Gatherings with Friends and Family: Once a week    Attends Religious Services: Never    Database administrator or Organizations: No    Attends Banker Meetings: Never    Marital Status: Divorced  Catering manager Violence: Not At Risk (04/19/2023)   Humiliation, Afraid, Rape, and Kick questionnaire    Fear of Current or Ex-Partner: No    Emotionally Abused: No    Physically Abused: No    Sexually Abused: No    FAMILY HISTORY: Her family history includes Alcohol abuse in her father; Breast cancer (age of onset: 19) in her mother; Diabetes in her maternal aunt, maternal uncle, paternal grandfather, and paternal uncle; Heart disease in her father; Mental illness in her brother.  CURRENT MEDICATIONS   Current Outpatient Medications  Medication Instructions   atorvastatin (LIPITOR) 10 mg, Oral, Daily   Calcium Citrate-Vitamin D (CALCIUM CITRATE + D PO) 1 tablet, Daily   Coenzyme Q10 (COQ10) 200 MG CAPS 1 capsule, Daily   cyanocobalamin (VITAMIN B12) 1,000 mcg,  Daily   Ferrous Sulfate (IRON) 325 (65 Fe) MG TABS Daily   fluconazole (DIFLUCAN) 150 mg, Oral, Weekly   lansoprazole (PREVACID) 15 mg, Oral, Daily   levothyroxine (SYNTHROID) 25 mcg, Oral, Daily   lisinopril (ZESTRIL) 5 mg, Oral, Daily   loratadine (CLARITIN) 10 mg, Daily PRN   melatonin 5 mg, Daily at bedtime   Omega-3 Fatty Acids (FISH OIL) 1000 MG CAPS 1 capsule, Daily   Ozempic (0.25 or 0.5 MG/DOSE) 0.5 mg, Injection, Weekly   sertraline (ZOLOFT) 100 mg, Oral, Daily   Vitamin D3 1,000 Units, Oral, Daily     ALLERGIES  She is allergic to latex and nickel.  REVIEW OF SYSTEMS:  Review of Systems - Oncology   Rest of the pertinent review of systems is unremarkable except as mentioned above in HPI.  PHYSICAL EXAMINATION:    Onc Performance Status - 04/19/23 1419       ECOG Perf Status   ECOG Perf Status Restricted in physically strenuous activity but ambulatory and able to carry out work of a light or sedentary nature, e.g., light house work, office work      KPS SCALE   KPS % SCORE Normal activity with effort, some s/s of disease             Vitals:   04/19/23 1414  BP: 125/76  Pulse: 84  Resp: 18  Temp: 98 F (36.7 C)  SpO2: 100%   Filed Weights   04/19/23 1414  Weight: 203 lb 9.6 oz (92.4 kg)    Physical Exam Constitutional:      General: She is not in acute distress.    Appearance: Normal appearance.  HENT:     Head: Normocephalic and atraumatic.  Eyes:     General: No scleral icterus.    Conjunctiva/sclera: Conjunctivae normal.  Cardiovascular:     Rate and Rhythm: Normal rate and regular rhythm.     Heart sounds: Normal heart sounds.  Pulmonary:     Effort: Pulmonary effort is normal.     Breath  sounds: Normal breath sounds.  Abdominal:     General: There is no distension.  Musculoskeletal:     Right lower leg: No edema.     Left lower leg: No edema.  Neurological:     General: No focal deficit present.     Mental Status: She is alert  and oriented to person, place, and time.  Psychiatric:        Mood and Affect: Mood normal.        Behavior: Behavior normal.        Thought Content: Thought content normal.      LABORATORY DATA:   I have reviewed the data as listed.  Results for orders placed or performed in visit on 04/19/23  TSH  Result Value Ref Range   TSH 2.421 0.350 - 4.500 uIU/mL  CEA ([facility])  Result Value Ref Range   CEA ([facility]) 2.92 0.00 - 5.00 ng/mL  Lactate dehydrogenase  Result Value Ref Range   LDH 138 98 - 192 U/L  CMP (Cancer Center only)  Result Value Ref Range   Sodium 139 135 - 145 mmol/L   Potassium 3.8 3.5 - 5.1 mmol/L   Chloride 101 98 - 111 mmol/L   CO2 30 22 - 32 mmol/L   Glucose, Bld 121 (H) 70 - 99 mg/dL   BUN 18 8 - 23 mg/dL   Creatinine 6.29 (H) 5.28 - 1.00 mg/dL   Calcium 9.7 8.9 - 41.3 mg/dL   Total Protein 7.5 6.5 - 8.1 g/dL   Albumin 4.6 3.5 - 5.0 g/dL   AST 16 15 - 41 U/L   ALT 6 0 - 44 U/L   Alkaline Phosphatase 59 38 - 126 U/L   Total Bilirubin 0.5 0.0 - 1.2 mg/dL   GFR, Estimated 47 (L) >60 mL/min   Anion gap 8 5 - 15  CBC with Differential (Cancer Center Only)  Result Value Ref Range   WBC Count 5.4 4.0 - 10.5 K/uL   RBC 3.77 (L) 3.87 - 5.11 MIL/uL   Hemoglobin 12.5 12.0 - 15.0 g/dL   HCT 24.4 01.0 - 27.2 %   MCV 96.3 80.0 - 100.0 fL   MCH 33.2 26.0 - 34.0 pg   MCHC 34.4 30.0 - 36.0 g/dL   RDW 53.6 64.4 - 03.4 %   Platelet Count 157 150 - 400 K/uL   nRBC 0.0 0.0 - 0.2 %   Neutrophils Relative % 43 %   Neutro Abs 2.3 1.7 - 7.7 K/uL   Lymphocytes Relative 38 %   Lymphs Abs 2.1 0.7 - 4.0 K/uL   Monocytes Relative 18 %   Monocytes Absolute 1.0 0.1 - 1.0 K/uL   Eosinophils Relative 0 %   Eosinophils Absolute 0.0 0.0 - 0.5 K/uL   Basophils Relative 0 %   Basophils Absolute 0.0 0.0 - 0.1 K/uL   Immature Granulocytes 1 %   Abs Immature Granulocytes 0.04 0.00 - 0.07 K/uL    RADIOGRAPHIC STUDIES:  I have personally reviewed the radiological images as  listed and agreed with the findings in the report.  MM 3D SCREENING MAMMOGRAM BILATERAL BREAST Result Date: 04/14/2023 CLINICAL DATA:  Screening. EXAM: DIGITAL SCREENING BILATERAL MAMMOGRAM WITH TOMOSYNTHESIS AND CAD TECHNIQUE: Bilateral screening digital craniocaudal and mediolateral oblique mammograms were obtained. Bilateral screening digital breast tomosynthesis was performed. The images were evaluated with computer-aided detection. COMPARISON:  Previous exam(s). ACR Breast Density Category a: The breasts are almost entirely fatty. FINDINGS: There are no findings suspicious for malignancy.  IMPRESSION: No mammographic evidence of malignancy. A result letter of this screening mammogram will be mailed directly to the patient. RECOMMENDATION: Screening mammogram in one year. (Code:SM-B-01Y) BI-RADS CATEGORY  1: Negative. Electronically Signed   By: Amie Portland M.D.   On: 04/14/2023 10:59    Orders Placed This Encounter  Procedures   CBC with Differential (Cancer Center Only)    Standing Status:   Future    Number of Occurrences:   1    Expiration Date:   04/18/2024   CMP (Cancer Center only)    Standing Status:   Future    Number of Occurrences:   1    Expiration Date:   04/18/2024   Lactate dehydrogenase    Standing Status:   Future    Number of Occurrences:   1    Expiration Date:   04/18/2024   Flow Cytometry, Peripheral Blood (Oncology)    Standing Status:   Future    Number of Occurrences:   1    Expiration Date:   04/18/2024   Sedimentation rate    Standing Status:   Future    Number of Occurrences:   1    Expiration Date:   04/18/2024   C-reactive protein    Standing Status:   Future    Number of Occurrences:   1    Expiration Date:   04/18/2024   CEA (Access)    Standing Status:   Future    Number of Occurrences:   1    Expiration Date:   04/18/2024   BCR-ABL1 FISH    Standing Status:   Future    Number of Occurrences:   1    Expiration Date:   04/18/2024   TSH    Standing  Status:   Future    Number of Occurrences:   1    Expiration Date:   04/18/2024    Future Appointments  Date Time Provider Department Center  04/26/2023  3:30 PM Keyli Duross, Archie Patten, MD [facility] None  07/12/2023  2:30 PM [facility] PHLEBOTOMIST [facility] None  07/12/2023  3:00 PM Kamilo Och, MD [facility] None     I spent a total of 55 minutes during this encounter with the patient including review of chart and various tests results, discussions about plan of care and coordination of care plan.  This document was completed utilizing speech recognition software. Grammatical errors, random word insertions, pronoun errors, and incomplete sentences are an occasional consequence of this system due to software limitations, ambient noise, and hardware issues. Any formal questions or concerns about the content, text or information contained within the body of this dictation should be directly addressed to the provider for clarification.

## 2023-04-19 NOTE — Assessment & Plan Note (Addendum)
 Mild monocytosis with monocyte percentage at 16%, slightly above the normal range of 12%. Total monocyte count remains within normal limits, around 800-900, consistent since at least 2022.   No associated symptoms such as fever, chills, or night sweats.   Possible causes include inflammation or medication effects, but no new medications apart from lisinopril and recent discontinuation of Prevacid. No joint issues or pain reported.   Total white count, red count, and platelet count are normal.  Labs today showed normal white count of 5400 with normal differential.  Absolute monocyte count is 1000, within normal limits.  Hemoglobin and platelet count are within normal limits.  CMP showed creatinine of 1.17, otherwise unremarkable.  CEA, TSH are within normal limits.  We did check BCR/ABL 1 and flow cytometry of peripheral blood to rule out any myeloproliferative neoplasm, although clinical picture seems to be less likely.  - Arrange a phone call visit in one week to discuss blood test results.  No additional hematological workup or intervention would be needed at current levels.  Will continue monitoring.  Plan to see her again in 3 months for follow-up with repeat labs.  If stable blood counts, she can be discharged from our office after next visit.

## 2023-04-22 LAB — BCR-ABL1 FISH
Cells Analyzed: 200
Cells Counted: 200

## 2023-04-26 ENCOUNTER — Encounter: Payer: Self-pay | Admitting: Oncology

## 2023-04-26 ENCOUNTER — Inpatient Hospital Stay: Attending: Oncology | Admitting: Oncology

## 2023-04-26 DIAGNOSIS — D72821 Monocytosis (symptomatic): Secondary | ICD-10-CM | POA: Diagnosis not present

## 2023-04-26 DIAGNOSIS — Z85038 Personal history of other malignant neoplasm of large intestine: Secondary | ICD-10-CM

## 2023-04-26 NOTE — Assessment & Plan Note (Signed)
 Previously she was seen in our clinic by Dr. Truett Perna for her history of stage III B (T4 N1) (LN 1/15) adenocarcinoma of rectosigmoid colon, microsatellite stable, status post low anterior resection on 04/15/2022 followed by adjuvant chemotherapy with dose reduced Xeloda, completed in October 2014.  She was in clinical remission.  Last colonoscopy in 2019 showed no evidence of disease recurrence.  She was last seen by Dr. Truett Perna in September 2019 and was lost to follow-up after that.  She reports having regular checks since her cancer diagnosis and is scheduled for another colonoscopy next year. The last colonoscopy was in 2019 and showed no concerning findings.   CEA normal on 04/19/2023.  No clinical signs or symptoms/of disease recurrence.  No indication for routine imaging since the diagnosis was more than 10 years ago.

## 2023-04-26 NOTE — Assessment & Plan Note (Signed)
 Mild monocytosis with monocyte percentage at 16%, slightly above the normal range of 12%. Total monocyte count remains within normal limits, around 800-900, consistent since at least 2022.   No associated symptoms such as fever, chills, or night sweats.   Possible causes include inflammation or medication effects, but no new medications apart from lisinopril and recent discontinuation of Prevacid. No joint issues or pain reported.   Total white count, red count, and platelet count are normal.  On her consultation with [place] on 04/19/2023, labs showed normal white count of 5400 with normal differential.  Absolute monocyte count was 1000, within normal limits.  Hemoglobin and platelet count were within normal limits.  CMP showed creatinine of 1.17, otherwise unremarkable.  CRP, Sed rate, CEA, TSH were within normal limits.  We did check BCR/ABL 1 and it was negative.  No additional hematological workup or intervention would be needed at current levels.  Will continue monitoring.   Plan to see her again in 3 months for follow-up with repeat labs.  If stable blood counts, she can be discharged from our office after next visit.

## 2023-04-26 NOTE — Progress Notes (Signed)
 [facility]  HEMATOLOGY-ONCOLOGY ELECTRONIC VISIT PROGRESS NOTE  PATIENT NAME: Lisa Osborne   MR#: [id number] DOB: 02/19/1940  DATE OF SERVICE: 04/26/2023  Patient Care Team: Eustaquio Boyden, MD as PCP - General (Family Medicine) Wendall Stade, MD as PCP - Cardiology (Cardiology) Karie Soda, MD as Consulting Physician (General Surgery) Louis Meckel, MD (Inactive) as Consulting Physician (Gastroenterology) Cira Rue, RN as Registered Nurse (Oncology) Blair Promise, OD as Referring Physician (Optometry) Rana Snare, NP as Nurse Practitioner (Hematology and Oncology) Ladene Artist, MD as Consulting Physician (Oncology)  I connected with the patient via telephone conference and verified that I am speaking with the correct person using two identifiers. The patient's location is at home and I am providing care from the [facility].  I discussed the limitations, risks, security and privacy concerns of performing an evaluation and management service by e-visits and the availability of in person appointments.  I also discussed with the patient that there may be a patient responsible charge related to this service. The patient expressed understanding and agreed to proceed.   ASSESSMENT & PLAN:   Lisa Osborne is a 83 y.o. 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   Monocytosis Mild monocytosis with monocyte percentage at 16%, slightly above the normal range of 12%. Total monocyte count remains within normal limits, around 800-900, consistent since at least 2022.   No associated symptoms such as fever, chills, or night  sweats.   Possible causes include inflammation or medication effects, but no new medications apart from lisinopril and recent discontinuation of Prevacid. No joint issues or pain reported.   Total white count, red count, and platelet count are normal.  On her consultation with [place] on 04/19/2023, labs showed normal white count of 5400 with normal differential.  Absolute monocyte count was 1000, within normal limits.  Hemoglobin and platelet count were within normal limits.  CMP showed creatinine of 1.17, otherwise unremarkable.  CRP, Sed rate, CEA, TSH were within normal limits.  We did check BCR/ABL 1 and it was negative.  No additional hematological workup or intervention would be needed at current levels.  Will continue monitoring.   Plan to see her again in 3 months for follow-up with repeat labs.  If stable blood counts, she can be discharged from our office after next visit.  Hx of colon cancer, stage III Previously she was seen in our clinic by Dr. Truett Perna for her history of stage III B (T4 N1) (LN 1/15) adenocarcinoma of rectosigmoid colon, microsatellite stable, status post low anterior resection on 04/15/2022 followed by adjuvant chemotherapy with dose reduced Xeloda, completed in October 2014.  She was in clinical remission.  Last colonoscopy in 2019 showed no evidence of disease recurrence.  She was last seen by Dr. Truett Perna in September 2019 and was lost to follow-up after that.  She reports having regular checks since her cancer diagnosis and is scheduled for another colonoscopy next year. The last colonoscopy was in 2019 and showed no concerning findings.   CEA normal on 04/19/2023.  No clinical signs or symptoms/of disease recurrence.  No indication for routine imaging since the diagnosis was more than 10 years ago.  I discussed the assessment and treatment plan with the patient. The patient was provided an opportunity to ask questions and all were answered. The patient agreed with  the plan and demonstrated an understanding of the instructions. The patient was advised to call back or seek an in-person evaluation if the symptoms worsen or if the condition fails to improve as anticipated.    I spent 12 minutes over the phone with the patient reviewing test results, discuss management and coordination/planning of care.  Meryl Crutch, MD 04/26/2023 3:55 PM [facility] - [facility] [address] [place] [address] Dept: [phone] Dept Fax: [phone]   INTERVAL HISTORY:  Please see above for problem oriented charting.  The purpose of today's discussion is to explain recent lab results and to formulate plan of care.  There were signs of dehydration noted in her lab results. She is working on increasing her liquid intake.  She recently visited her dentist and discovered she has four teeth in danger, requiring one root canal, one implant, and two fillings. She suspects this dental issue may be contributing to an infection.  SUMMARY OF HEMATOLOGY HISTORY:   During her routine visit with her PCP on 03/25/2023, labs showed white count of 6300.  On the differential, monocytes were slightly elevated at 16.4%, with absolute monocyte count still within normal limits at 1000.  ANC was 2500.  Hemoglobin and platelet count were within normal limits.  Previously labs in January 2024 and November 2022 also showed slightly increased monocyte percentage at 16.9% and 17.7%.  Absolute monocyte count was 900 and 800 respectively.  Given this mild relative monocytosis, a referral was sent to [place] for further evaluation.   Previously she was seen in our clinic by Dr. Truett Perna for her history of stage III B (T4 N1) (LN 1/15) adenocarcinoma of rectosigmoid colon, microsatellite stable, status post low anterior resection on 04/15/2022 followed by adjuvant chemotherapy with dose reduced Xeloda, completed in  October 2014.  She was in clinical remission.  Last colonoscopy in 2019 showed no evidence of disease recurrence.  She was last seen by Dr. Truett Perna in September 2019 and was lost to follow-up after that.   She reports having regular checks since her cancer diagnosis and is scheduled for another colonoscopy next year. The last colonoscopy was in 2019 and showed no concerning findings. The patient's primary care doctor referred her due to a slightly high monocyte count in her blood work. She denies any joint issues or pain. She has been started on lisinopril for kidney issues, which have been fluctuating recently. The patient also reports a significant weight loss of forty-three pounds over the past three months due to taking Ozempic. She has not started any new medications recently, but she has stopped taking Prevacid for GERD on her daughter's advice and has increased her calcium intake. The patient denies any fevers, chills, or night sweats but reports feeling cold all the time, which she attributes to her weight loss.  Mild monocytosis with monocyte percentage at 16%, slightly above the normal range of 12%. Total monocyte count remains within normal limits, around 800-900, consistent since at least 2022.    No associated symptoms such as fever, chills, or night sweats.    Possible causes include inflammation or medication effects, but no new medications apart from lisinopril and recent discontinuation of Prevacid. No joint issues or pain reported.    Total white count, red  count, and platelet count are normal.   On her consultation with [place] on 04/19/2023, labs showed normal white count of 5400 with normal differential.  Absolute monocyte count was 1000, within normal limits.  Hemoglobin and platelet count were within normal limits.  CMP showed creatinine of 1.17, otherwise unremarkable.  CRP, Sed rate, CEA, TSH were within normal limits.  We did check BCR/ABL 1 and it was negative.  No additional  hematological workup or intervention would be needed at current levels.  Will continue monitoring.   Plan to see her again in 3 months for follow-up with repeat labs.  If stable blood counts, she can be discharged from our office after next visit.    REVIEW OF SYSTEMS:    Review of Systems - Oncology  All other pertinent systems were reviewed with the patient and are negative.  I have reviewed the past medical history, past surgical history, social history and family history with the patient and they are unchanged from previous note.  ALLERGIES:  She is allergic to latex and nickel.  MEDICATIONS:  Current Outpatient Medications  Medication Sig Dispense Refill   atorvastatin (LIPITOR) 10 MG tablet Take 1 tablet (10 mg total) by mouth daily. 90 tablet 4   Calcium Citrate-Vitamin D (CALCIUM CITRATE + D PO) Take 1 tablet by mouth daily.      Cholecalciferol (VITAMIN D3) 25 MCG (1000 UT) CAPS Take 1 capsule (1,000 Units total) by mouth daily.     Coenzyme Q10 (COQ10) 200 MG CAPS Take 1 capsule by mouth daily.      Ferrous Sulfate (IRON) 325 (65 Fe) MG TABS Take by mouth daily.     fluconazole (DIFLUCAN) 150 MG tablet Take 1 tablet (150 mg total) by mouth once a week. 2 tablet 0   lansoprazole (PREVACID) 15 MG capsule Take 1 capsule (15 mg total) by mouth daily. (Patient not taking: Reported on 04/19/2023) 90 capsule 4   levothyroxine (SYNTHROID) 25 MCG tablet Take 1 tablet (25 mcg total) by mouth daily. 90 tablet 4   lisinopril (ZESTRIL) 5 MG tablet Take 1 tablet (5 mg total) by mouth daily. 90 tablet 3   loratadine (CLARITIN) 10 MG tablet Take 10 mg by mouth daily as needed for allergies. Reported on 03/17/2015     Melatonin 5 MG TABS Take 5 mg by mouth at bedtime.      Omega-3 Fatty Acids (FISH OIL) 1000 MG CAPS Take 1 capsule by mouth daily.     Semaglutide,0.25 or 0.5MG /DOS, (OZEMPIC, 0.25 OR 0.5 MG/DOSE,) 2 MG/3ML SOPN Inject 0.5 mg as directed once a week. 6 mL 3   sertraline (ZOLOFT)  100 MG tablet Take 1 tablet (100 mg total) by mouth daily. 90 tablet 4   vitamin B-12 (CYANOCOBALAMIN) 1000 MCG tablet Take 1,000 mcg by mouth daily.     No current facility-administered medications for this visit.    PHYSICAL EXAMINATION:   Onc Performance Status - 04/26/23 1500       ECOG Perf Status   ECOG Perf Status Restricted in physically strenuous activity but ambulatory and able to carry out work of a light or sedentary nature, e.g., light house work, office work      KPS SCALE   KPS % SCORE Normal activity with effort, some s/s of disease             LABORATORY DATA:   I have reviewed the data as listed.  Recent Results (from the past 2160 hours)  Vitamin B12  Status: None   Collection Time: 03/25/23  8:27 AM  Result Value Ref Range   Vitamin B-12 647 211 - 911 pg/mL  TSH     Status: None   Collection Time: 03/25/23  8:27 AM  Result Value Ref Range   TSH 3.18 0.35 - 5.50 uIU/mL  CBC with Differential/Platelet     Status: Abnormal   Collection Time: 03/25/23  8:27 AM  Result Value Ref Range   WBC 6.3 4.0 - 10.5 K/uL   RBC 3.87 3.87 - 5.11 Mil/uL   Hemoglobin 12.7 12.0 - 15.0 g/dL   HCT 16.1 09.6 - 04.5 %   MCV 97.6 78.0 - 100.0 fl   MCHC 33.5 30.0 - 36.0 g/dL   RDW 40.9 81.1 - 91.4 %   Platelets 153.0 150.0 - 400.0 K/uL   Neutrophils Relative % 39.6 (L) 43.0 - 77.0 %   Lymphocytes Relative 43.2 12.0 - 46.0 %   Monocytes Relative 16.4 (H) 3.0 - 12.0 %   Eosinophils Relative 0.1 0.0 - 5.0 %   Basophils Relative 0.7 0.0 - 3.0 %   Neutro Abs 2.5 1.4 - 7.7 K/uL   Lymphs Abs 2.7 0.7 - 4.0 K/uL   Monocytes Absolute 1.0 0.1 - 1.0 K/uL   Eosinophils Absolute 0.0 0.0 - 0.7 K/uL   Basophils Absolute 0.0 0.0 - 0.1 K/uL  Parathyroid hormone, intact (no Ca)     Status: None   Collection Time: 03/25/23  8:27 AM  Result Value Ref Range   PTH 62 16 - 77 pg/mL    Comment: . Interpretive Guide    Intact PTH           Calcium ------------------    ----------            ------- Normal Parathyroid    Normal               Normal Hypoparathyroidism    Low or Low Normal    Low Hyperparathyroidism    Primary            Normal or High       High    Secondary          High                 Normal or Low    Tertiary           High                 High Non-Parathyroid    Hypercalcemia      Low or Low Normal    High .   Microalbumin / creatinine urine ratio     Status: Abnormal   Collection Time: 03/25/23  8:27 AM  Result Value Ref Range   Microalb, Ur 8.8 (H) 0.0 - 1.9 mg/dL   Creatinine,U 782.9 mg/dL   Microalb Creat Ratio 38.9 (H) 0.0 - 30.0 mg/g  VITAMIN D 25 Hydroxy (Vit-D Deficiency, Fractures)     Status: None   Collection Time: 03/25/23  8:27 AM  Result Value Ref Range   VITD 90.92 30.00 - 100.00 ng/mL  Phosphorus     Status: None   Collection Time: 03/25/23  8:27 AM  Result Value Ref Range   Phosphorus 3.5 2.3 - 4.6 mg/dL  Comprehensive metabolic panel     Status: Abnormal   Collection Time: 03/25/23  8:27 AM  Result Value Ref Range   Sodium 141 135 - 145 mEq/L   Potassium 4.0  3.5 - 5.1 mEq/L   Chloride 102 96 - 112 mEq/L   CO2 29 19 - 32 mEq/L   Glucose, Bld 109 (H) 70 - 99 mg/dL   BUN 21 6 - 23 mg/dL   Creatinine, Ser 4.09 0.40 - 1.20 mg/dL   Total Bilirubin 0.5 0.2 - 1.2 mg/dL   Alkaline Phosphatase 59 39 - 117 U/L   AST 17 0 - 37 U/L   ALT 9 0 - 35 U/L   Total Protein 7.3 6.0 - 8.3 g/dL   Albumin 4.4 3.5 - 5.2 g/dL   GFR 81.19 (L) >14.78 mL/min    Comment: Calculated using the CKD-EPI Creatinine Equation (2021)   Calcium 9.5 8.4 - 10.5 mg/dL  Lipid panel     Status: None   Collection Time: 03/25/23  8:27 AM  Result Value Ref Range   Cholesterol 146 0 - 200 mg/dL    Comment: ATP III Classification       Desirable:  < 200 mg/dL               Borderline High:  200 - 239 mg/dL          High:  > = 295 mg/dL   Triglycerides 621.3 0.0 - 149.0 mg/dL    Comment: Normal:  <086 mg/dLBorderline High:  150 - 199 mg/dL   HDL 57.84 >69.62  mg/dL   VLDL 95.2 0.0 - 84.1 mg/dL   LDL Cholesterol 62 0 - 99 mg/dL   Total CHOL/HDL Ratio 3     Comment:                Men          Women1/2 Average Risk     3.4          3.3Average Risk          5.0          4.42X Average Risk          9.6          7.13X Average Risk          15.0          11.0                       NonHDL 89.28     Comment: NOTE:  Non-HDL goal should be 30 mg/dL higher than patient's LDL goal (i.e. LDL goal of < 70 mg/dL, would have non-HDL goal of < 100 mg/dL)  Hemoglobin L2G     Status: None   Collection Time: 03/25/23  8:27 AM  Result Value Ref Range   Hgb A1c MFr Bld 5.6 4.6 - 6.5 %    Comment: Glycemic Control Guidelines for People with Diabetes:Non Diabetic:  <6%Goal of Therapy: <7%Additional Action Suggested:  >8%   TSH     Status: None   Collection Time: 04/19/23  3:09 PM  Result Value Ref Range   TSH 2.421 0.350 - 4.500 uIU/mL    Comment: Performed by a 3rd Generation assay with a functional sensitivity of <=0.01 uIU/mL. Performed at [facility], [address] [place], [place] [address]   BCR-ABL1 FISH     Status: None   Collection Time: 04/19/23  3:09 PM  Result Value Ref Range   Specimen Type BLOOD    Cells Counted 200    Cells Analyzed 200    FISH Result ABL1 GENE FUSION     Comment: Comment: NO BCR  Interpretation Comment:     Comment: (NOTE) NEGATIVE             nuc ish 9q34(ASS1,ABL1)x2,22q11.2(BCRx2)[200].      The fluorescence in situ hybridization (FISH) study was normal. FISH, using unique sequence DNA probes for the ABL1 and BCR gene regions showed two ABL1 signals (red), two control ASS1 gene signals (aqua) located adjacent to the ABL1 locus at 9q34, and two BCR signals (green) at 22q11.2 in all interphase nuclei examined. There was NO evidence of CML or ALL-associated BCR::ABL1 dual fusion signals in this analysis. .      This analysis is limited to abnormalities detectable by the specific probes included  in the study. FISH results should be interpreted within the context of a full cytogenetic analysis and pathology evaluation.  A BCR::ABL1 gene fusion in greater than 3 interphase nuclei in a patient with a new clinical diagnosis is considered positive. The DNA probe vendor for this study was [employer], community). .      This test was developed and its performance characteristics d etermined by [employer] ([employer]). It has not been cleared or approved by the U.S. Food and Drug Administration.    Director Review: Comment:     Comment: (NOTE) Miguel Rota, PHD Performed At: [facility] [address] [place], [place] [id number] Maurine Simmering MDPhD WG:[id number]   CEA (Access)     Status: None   Collection Time: 04/19/23  3:09 PM  Result Value Ref Range   CEA ([facility]) 2.92 0.00 - 5.00 ng/mL    Comment: (NOTE) This test was performed using [employer]'s paramagnetic chemiluminescent immunoassay. Values obtained from different assay methods cannot be used interchangeably. Please note that up to 8% of patients who smoke may see values 5.1-10.0 ng/ml and 1% of patients who smoke may see CEA levels >10.0 ng/ml. Performed at [facility], [address] [place], [place] [address]   C-reactive protein     Status: None   Collection Time: 04/19/23  3:09 PM  Result Value Ref Range   CRP 0.7 <1.0 mg/dL    Comment: Performed at [facility], [address], [place], [place] [address]  Sedimentation rate     Status: None   Collection Time: 04/19/23  3:09 PM  Result Value Ref Range   Sed Rate 15 0 - 22 mm/hr    Comment: Performed at [facility], [address] [place], [place] [address]  Lactate dehydrogenase     Status: None   Collection Time: 04/19/23  3:09 PM  Result Value Ref Range   LDH 138 98 - 192 U/L    Comment: Performed at [facility], [address],  [place], [place] [address]  CMP (Cancer Center only)     Status: Abnormal   Collection Time: 04/19/23  3:09 PM  Result Value Ref Range   Sodium 139 135 - 145 mmol/L   Potassium 3.8 3.5 - 5.1 mmol/L   Chloride 101 98 - 111 mmol/L   CO2 30 22 - 32 mmol/L   Glucose, Bld 121 (H) 70 - 99 mg/dL    Comment: Glucose reference range applies only to samples taken after fasting for at least 8 hours.   BUN 18 8 - 23 mg/dL   Creatinine 2.72 (H) 5.36 - 1.00 mg/dL   Calcium 9.7 8.9 - 64.4 mg/dL   Total Protein 7.5 6.5 - 8.1 g/dL   Albumin 4.6 3.5 - 5.0  g/dL   AST 16 15 - 41 U/L   ALT 6 0 - 44 U/L   Alkaline Phosphatase 59 38 - 126 U/L   Total Bilirubin 0.5 0.0 - 1.2 mg/dL   GFR, Estimated 47 (L) >60 mL/min    Comment: (NOTE) Calculated using the CKD-EPI Creatinine Equation (2021)    Anion gap 8 5 - 15    Comment: Performed at [facility], [address] [place], [place] [address]  CBC with Differential (Cancer Center Only)     Status: Abnormal   Collection Time: 04/19/23  3:09 PM  Result Value Ref Range   WBC Count 5.4 4.0 - 10.5 K/uL   RBC 3.77 (L) 3.87 - 5.11 MIL/uL   Hemoglobin 12.5 12.0 - 15.0 g/dL   HCT 60.4 54.0 - 98.1 %   MCV 96.3 80.0 - 100.0 fL   MCH 33.2 26.0 - 34.0 pg   MCHC 34.4 30.0 - 36.0 g/dL   RDW 19.1 47.8 - 29.5 %   Platelet Count 157 150 - 400 K/uL   nRBC 0.0 0.0 - 0.2 %   Neutrophils Relative % 43 %   Neutro Abs 2.3 1.7 - 7.7 K/uL   Lymphocytes Relative 38 %   Lymphs Abs 2.1 0.7 - 4.0 K/uL   Monocytes Relative 18 %   Monocytes Absolute 1.0 0.1 - 1.0 K/uL   Eosinophils Relative 0 %   Eosinophils Absolute 0.0 0.0 - 0.5 K/uL   Basophils Relative 0 %   Basophils Absolute 0.0 0.0 - 0.1 K/uL   Immature Granulocytes 1 %   Abs Immature Granulocytes 0.04 0.00 - 0.07 K/uL    Comment: Performed at [facility], [address] [place], [place] [address]     RADIOGRAPHIC STUDIES:  I have personally reviewed the radiological images  as listed and agree with the findings in the report.  MM 3D SCREENING MAMMOGRAM BILATERAL BREAST Result Date: 04/14/2023 CLINICAL DATA:  Screening. EXAM: DIGITAL SCREENING BILATERAL MAMMOGRAM WITH TOMOSYNTHESIS AND CAD TECHNIQUE: Bilateral screening digital craniocaudal and mediolateral oblique mammograms were obtained. Bilateral screening digital breast tomosynthesis was performed. The images were evaluated with computer-aided detection. COMPARISON:  Previous exam(s). ACR Breast Density Category a: The breasts are almost entirely fatty. FINDINGS: There are no findings suspicious for malignancy. IMPRESSION: No mammographic evidence of malignancy. A result letter of this screening mammogram will be mailed directly to the patient. RECOMMENDATION: Screening mammogram in one year. (Code:[id number]-B-01Y) BI-RADS CATEGORY  1: Negative. Electronically Signed   By: Amie Portland M.D.   On: 04/14/2023 10:59    Orders Placed This Encounter  Procedures   CMP (Cancer Center only)    Standing Status:   Future    Expiration Date:   04/25/2024   CBC with Differential (Cancer Center Only)    Standing Status:   Future    Expiration Date:   04/25/2024   Lactate dehydrogenase    Standing Status:   Future    Expiration Date:   04/25/2024   CEA (Access)    Standing Status:   Future    Expiration Date:   04/25/2024     Future Appointments  Date Time Provider Department Center  07/12/2023  2:30 PM [facility] PHLEBOTOMIST [facility] None  07/12/2023  3:00 PM Lakyn Alsteen, Archie Patten, MD [facility] None    This document was completed utilizing speech recognition software. Grammatical errors, random word insertions, pronoun errors, and incomplete sentences are an occasional consequence of this system due to software limitations, ambient noise, and  hardware issues. Any formal questions or concerns about the content, text or information contained within the body of this dictation should be directly addressed to the provider for clarification.

## 2023-07-12 ENCOUNTER — Encounter: Payer: Self-pay | Admitting: Oncology

## 2023-07-12 ENCOUNTER — Inpatient Hospital Stay

## 2023-07-12 ENCOUNTER — Inpatient Hospital Stay: Attending: Oncology | Admitting: Oncology

## 2023-07-12 VITALS — BP 124/75 | HR 79 | Temp 98.7°F | Resp 17 | Ht 66.0 in | Wt 195.4 lb

## 2023-07-12 DIAGNOSIS — Z85038 Personal history of other malignant neoplasm of large intestine: Secondary | ICD-10-CM

## 2023-07-12 DIAGNOSIS — Z79899 Other long term (current) drug therapy: Secondary | ICD-10-CM | POA: Diagnosis not present

## 2023-07-12 DIAGNOSIS — Z85048 Personal history of other malignant neoplasm of rectum, rectosigmoid junction, and anus: Secondary | ICD-10-CM | POA: Diagnosis not present

## 2023-07-12 DIAGNOSIS — D72821 Monocytosis (symptomatic): Secondary | ICD-10-CM | POA: Insufficient documentation

## 2023-07-12 DIAGNOSIS — R634 Abnormal weight loss: Secondary | ICD-10-CM | POA: Insufficient documentation

## 2023-07-12 DIAGNOSIS — Z87891 Personal history of nicotine dependence: Secondary | ICD-10-CM | POA: Insufficient documentation

## 2023-07-12 DIAGNOSIS — Z7985 Long-term (current) use of injectable non-insulin antidiabetic drugs: Secondary | ICD-10-CM | POA: Diagnosis not present

## 2023-07-12 DIAGNOSIS — Z9221 Personal history of antineoplastic chemotherapy: Secondary | ICD-10-CM | POA: Diagnosis not present

## 2023-07-12 DIAGNOSIS — Z9049 Acquired absence of other specified parts of digestive tract: Secondary | ICD-10-CM | POA: Insufficient documentation

## 2023-07-12 LAB — CBC WITH DIFFERENTIAL (CANCER CENTER ONLY)
Abs Immature Granulocytes: 0.04 10*3/uL (ref 0.00–0.07)
Basophils Absolute: 0 10*3/uL (ref 0.0–0.1)
Basophils Relative: 0 %
Eosinophils Absolute: 0 10*3/uL (ref 0.0–0.5)
Eosinophils Relative: 0 %
HCT: 34.8 % — ABNORMAL LOW (ref 36.0–46.0)
Hemoglobin: 12.1 g/dL (ref 12.0–15.0)
Immature Granulocytes: 1 %
Lymphocytes Relative: 47 %
Lymphs Abs: 2.3 10*3/uL (ref 0.7–4.0)
MCH: 32.9 pg (ref 26.0–34.0)
MCHC: 34.8 g/dL (ref 30.0–36.0)
MCV: 94.6 fL (ref 80.0–100.0)
Monocytes Absolute: 0.8 10*3/uL (ref 0.1–1.0)
Monocytes Relative: 17 %
Neutro Abs: 1.8 10*3/uL (ref 1.7–7.7)
Neutrophils Relative %: 35 %
Platelet Count: 138 10*3/uL — ABNORMAL LOW (ref 150–400)
RBC: 3.68 MIL/uL — ABNORMAL LOW (ref 3.87–5.11)
RDW: 11.9 % (ref 11.5–15.5)
WBC Count: 5 10*3/uL (ref 4.0–10.5)
nRBC: 0 % (ref 0.0–0.2)

## 2023-07-12 LAB — CMP (CANCER CENTER ONLY)
ALT: 10 U/L (ref 0–44)
AST: 22 U/L (ref 15–41)
Albumin: 4.4 g/dL (ref 3.5–5.0)
Alkaline Phosphatase: 69 U/L (ref 38–126)
Anion gap: 12 (ref 5–15)
BUN: 26 mg/dL — ABNORMAL HIGH (ref 8–23)
CO2: 26 mmol/L (ref 22–32)
Calcium: 9.8 mg/dL (ref 8.9–10.3)
Chloride: 102 mmol/L (ref 98–111)
Creatinine: 1.02 mg/dL — ABNORMAL HIGH (ref 0.44–1.00)
GFR, Estimated: 55 mL/min — ABNORMAL LOW (ref >60)
Glucose, Bld: 107 mg/dL — ABNORMAL HIGH (ref 70–99)
Potassium: 3.9 mmol/L (ref 3.5–5.1)
Sodium: 141 mmol/L (ref 135–145)
Total Bilirubin: 0.3 mg/dL (ref 0.0–1.2)
Total Protein: 7.5 g/dL (ref 6.5–8.1)

## 2023-07-12 LAB — CEA (ACCESS): CEA (CHCC): 2.22 ng/mL (ref 0.00–5.00)

## 2023-07-12 LAB — LACTATE DEHYDROGENASE: LDH: 197 U/L — ABNORMAL HIGH (ref 98–192)

## 2023-07-12 NOTE — Assessment & Plan Note (Addendum)
 Mild monocytosis with monocyte percentage at 16%, slightly above the normal range of 12%. Total monocyte count remains within normal limits, around 800-900, consistent since at least 2022.   No associated symptoms such as fever, chills, or night sweats.   Possible causes include inflammation or medication effects, but no new medications apart from lisinopril  and recent discontinuation of Prevacid . No joint issues or pain reported.   Total white count, red count, and platelet count are normal.  On her consultation with us  on 04/19/2023, labs showed normal white count of 5400 with normal differential.  Absolute monocyte count was 1000, within normal limits.  Hemoglobin and platelet count were within normal limits.  CMP showed creatinine of 1.17, otherwise unremarkable.  CRP, Sed rate, CEA, TSH were within normal limits.  We did check BCR/ABL 1 and it was negative.  Repeat labs today showed normal monocyte count of 800.  White count normal at 5000, normal differential.  No additional hematological workup or intervention would be needed at current levels. She can be discharged from our office for continued follow-up with her PCP.  Please reconsult us  as necessary.

## 2023-07-12 NOTE — Assessment & Plan Note (Signed)
 Previously she was seen in our clinic by Dr. Truett Perna for her history of stage III B (T4 N1) (LN 1/15) adenocarcinoma of rectosigmoid colon, microsatellite stable, status post low anterior resection on 04/15/2022 followed by adjuvant chemotherapy with dose reduced Xeloda, completed in October 2014.  She was in clinical remission.  Last colonoscopy in 2019 showed no evidence of disease recurrence.  She was last seen by Dr. Truett Perna in September 2019 and was lost to follow-up after that.  She reports having regular checks since her cancer diagnosis and is scheduled for another colonoscopy next year. The last colonoscopy was in 2019 and showed no concerning findings.   CEA normal on 04/19/2023.  No clinical signs or symptoms/of disease recurrence.  No indication for routine imaging since the diagnosis was more than 10 years ago.

## 2023-07-12 NOTE — Progress Notes (Signed)
 [facility]  HEMATOLOGY CLINIC PROGRESS NOTE  PATIENT NAME: Lisa Osborne   MR#: [id number] DOB: [birth year]  Patient Care Team: Claire Crick, MD as PCP - General (Family Medicine) Loyde Rule, MD as PCP - Cardiology (Cardiology) Candyce Champagne, MD as Consulting Physician (General Surgery) Claudette Cue, MD (Inactive) as Consulting Physician (Gastroenterology) Alton Jewel, RN as Registered Nurse (Oncology) Arminda Berth, OD as Referring Physician (Optometry) Roseline Conine, NP as Nurse Practitioner (Hematology and Oncology) Sumner Ends, MD as Consulting Physician (Oncology)  Date of visit: 07/12/2023   ASSESSMENT & PLAN:   Lisa Osborne is a 83 y.o. lady with a past medical history of stage III B (T4 N1) (LN 1/15) adenocarcinoma of rectosigmoid colon, microsatellite stable, status post low anterior resection on 04/15/2022 followed by adjuvant chemotherapy with dose reduced Xeloda , completed in October 2014, has been in remission, was referred to our service in March 2025 for evaluation of monocytosis.    Monocytosis Mild monocytosis with monocyte percentage at 16%, slightly above the normal range of 12%. Total monocyte count remains within normal limits, around 800-900, consistent since at least 2022.   No associated symptoms such as fever, chills, or night sweats.   Possible causes include inflammation or medication effects, but no new medications apart from lisinopril  and recent discontinuation of Prevacid . No joint issues or pain reported.   Total white count, red count, and platelet count are normal.  On her consultation with us  on 04/19/2023, labs showed normal white count of 5400 with normal differential.  Absolute monocyte count was 1000, within normal limits.  Hemoglobin and platelet count were within normal limits.  CMP showed creatinine of 1.17, otherwise unremarkable.  CRP, Sed rate, CEA, TSH were within normal limits.   We did check BCR/ABL 1 and it was negative.  Repeat labs today showed normal monocyte count of 800.  White count normal at 5000, normal differential.  No additional hematological workup or intervention would be needed at current levels. She can be discharged from our office for continued follow-up with her PCP.  Please reconsult us  as necessary.  Hx of colon cancer, stage III Previously she was seen in our clinic by Dr. Scherrie Curt for her history of stage III B (T4 N1) (LN 1/15) adenocarcinoma of rectosigmoid colon, microsatellite stable, status post low anterior resection on 04/15/2022 followed by adjuvant chemotherapy with dose reduced Xeloda , completed in October 2014.  She was in clinical remission.  Last colonoscopy in 2019 showed no evidence of disease recurrence.  She was last seen by Dr. Scherrie Curt in September 2019 and was lost to follow-up after that.  She reports having regular checks since her cancer diagnosis and is scheduled for another colonoscopy next year. The last colonoscopy was in 2019 and showed no concerning findings.   CEA normal on 04/19/2023.  No clinical signs or symptoms/of disease recurrence.  No indication for routine imaging since the diagnosis was more than 10 years ago.    I spent a total of 20 minutes during this encounter with the patient including review of chart and various tests results, discussions about plan of care and coordination of care plan.  I reviewed lab results and outside records for this visit and discussed relevant results with the patient. Diagnosis, plan of care and treatment options were also discussed in detail with the patient. Opportunity provided to ask questions and answers provided to her apparent satisfaction. Provided instructions to call our clinic with any problems,  questions or concerns prior to return visit. I recommended to continue follow-up with PCP and sub-specialists. She verbalized understanding and agreed with the plan. No barriers  to learning was detected.  Arlo Berber, MD  07/12/2023 5:15 PM  [facility] - A DEPT OF [facility] [address] [place] [address] Dept: [phone] Dept Fax: [phone]   CHIEF COMPLAINT/ REASON FOR VISIT:  Follow-up for history of intermittent monocytosis.  Needed hematological workup.  INTERVAL HISTORY:  Discussed the use of AI scribe software for clinical note transcription with the patient, who gave verbal consent to proceed.  History of Present Illness Lisa Osborne is an 83 year old female who presents with recent falls and ankle injuries.  She has experienced two falls in the past month, resulting in both ankles rolling over. These incidents led to a broken toe and the loss of a toenail. Today marks the first day she has been able to wear regular shoes. She attributes the falls to standing up too quickly and having weak ankles, noting that she did not even take a step before her ankle gave out, causing her to fall to the floor.  She has a history of colon cancer, with a recent cancer marker test (CEA) returning negative. She has been under observation for more than ten years without issues. No blood in stools.  Her previous visit in March was due to a slightly elevated monocyte count. She is focusing on improving her hydration and nutrition, including taking protein daily.    SUMMARY OF HEMATOLOGIC HISTORY:  During her routine visit with her PCP on 03/25/2023, labs showed white count of 6300.  On the differential, monocytes were slightly elevated at 16.4%, with absolute monocyte count still within normal limits at 1000.  ANC was 2500.  Hemoglobin and platelet count were within normal limits.  Previously labs in January 2024 and November 2022 also showed slightly increased monocyte percentage at 16.9% and 17.7%.  Absolute monocyte count was 900 and 800 respectively.  Given this mild  relative monocytosis, a referral was sent to us  for further evaluation.   Previously she was seen in our clinic by Dr. Scherrie Curt for her history of stage III B (T4 N1) (LN 1/15) adenocarcinoma of rectosigmoid colon, microsatellite stable, status post low anterior resection on 04/15/2022 followed by adjuvant chemotherapy with dose reduced Xeloda , completed in October 2014.  She was in clinical remission.  Last colonoscopy in 2019 showed no evidence of disease recurrence.  She was last seen by Dr. Scherrie Curt in September 2019 and was lost to follow-up after that.   She reports having regular checks since her cancer diagnosis and is scheduled for another colonoscopy next year. The last colonoscopy was in 2019 and showed no concerning findings. The patient's primary care doctor referred her due to a slightly high monocyte count in her blood work. She denies any joint issues or pain. She has been started on lisinopril  for kidney issues, which have been fluctuating recently. The patient also reports a significant weight loss of forty-three pounds over the past three months due to taking Ozempic . She has not started any new medications recently, but she has stopped taking Prevacid  for GERD on her daughter's advice and has increased her calcium  intake. The patient denies any fevers, chills, or night sweats but reports feeling cold all the time, which she attributes to her weight loss.   Mild monocytosis with monocyte percentage at 16%,  slightly above the normal range of 12%. Total monocyte count remains within normal limits, around 800-900, consistent since at least 2022.    No associated symptoms such as fever, chills, or night sweats.    Possible causes include inflammation or medication effects, but no new medications apart from lisinopril  and recent discontinuation of Prevacid . No joint issues or pain reported.    Total white count, red count, and platelet count are normal.   On her consultation with us  on  04/19/2023, labs showed normal white count of 5400 with normal differential.  Absolute monocyte count was 1000, within normal limits.  Hemoglobin and platelet count were within normal limits.  CMP showed creatinine of 1.17, otherwise unremarkable.  CRP, Sed rate, CEA, TSH were within normal limits.  We did check BCR/ABL 1 and it was negative.   No additional hematological workup or intervention would be needed at current levels.  Will continue monitoring.   Repeat labs on 07/11/2020 also showed normal monocyte count.  No additional hematological workup or intervention is needed.  She will be discharged from our office for continued follow-up with her PCP.  I have reviewed the past medical history, past surgical history, social history and family history with the patient and they are unchanged from previous note.  ALLERGIES: She is allergic to latex and nickel.  MEDICATIONS:  Current Outpatient Medications  Medication Sig Dispense Refill   Calcium  Citrate-Vitamin D  (CALCIUM  CITRATE + D PO) Take 1 tablet by mouth daily.      Cholecalciferol  (VITAMIN D3) 25 MCG (1000 UT) CAPS Take 1 capsule (1,000 Units total) by mouth daily.     Coenzyme Q10 (COQ10) 200 MG CAPS Take 1 capsule by mouth daily.      Ferrous Sulfate (IRON) 325 (65 Fe) MG TABS Take by mouth daily.     lansoprazole  (PREVACID ) 15 MG capsule Take 1 capsule (15 mg total) by mouth daily. 90 capsule 4   levothyroxine  (SYNTHROID ) 25 MCG tablet Take 1 tablet (25 mcg total) by mouth daily. 90 tablet 4   lisinopril  (ZESTRIL ) 5 MG tablet Take 1 tablet (5 mg total) by mouth daily. 90 tablet 3   loratadine (CLARITIN) 10 MG tablet Take 10 mg by mouth daily as needed for allergies. Reported on 03/17/2015     Melatonin 5 MG TABS Take 5 mg by mouth at bedtime.      Omega-3 Fatty Acids (FISH OIL) 1000 MG CAPS Take 1 capsule by mouth daily.     Semaglutide ,0.25 or 0.5MG /DOS, (OZEMPIC , 0.25 OR 0.5 MG/DOSE,) 2 MG/3ML SOPN Inject 0.5 mg as directed once a  week. 6 mL 3   sertraline  (ZOLOFT ) 100 MG tablet Take 1 tablet (100 mg total) by mouth daily. 90 tablet 4   vitamin B-12 (CYANOCOBALAMIN ) 1000 MCG tablet Take 1,000 mcg by mouth daily.     atorvastatin  (LIPITOR) 10 MG tablet Take 1 tablet (10 mg total) by mouth daily. 90 tablet 4   No current facility-administered medications for this visit.     REVIEW OF SYSTEMS:    Review of Systems - Oncology  All other pertinent systems were reviewed with the patient and are negative.  PHYSICAL EXAMINATION:    Onc Performance Status - 07/12/23 1445       ECOG Perf Status   ECOG Perf Status Restricted in physically strenuous activity but ambulatory and able to carry out work of a light or sedentary nature, e.g., light house work, office work      KPS SCALE  KPS % SCORE Normal activity with effort, some s/s of disease          Vitals:   07/12/23 1442  BP: 124/75  Pulse: 79  Resp: 17  Temp: 98.7 F (37.1 C)  SpO2: 98%   Filed Weights   07/12/23 1442  Weight: 195 lb 6.4 oz (88.6 kg)    Physical Exam Constitutional:      General: She is not in acute distress.    Appearance: Normal appearance.  HENT:     Head: Normocephalic and atraumatic.   Cardiovascular:     Rate and Rhythm: Normal rate.  Pulmonary:     Effort: Pulmonary effort is normal. No respiratory distress.  Abdominal:     General: There is no distension.   Neurological:     General: No focal deficit present.     Mental Status: She is alert and oriented to person, place, and time.   Psychiatric:        Mood and Affect: Mood normal.        Behavior: Behavior normal.      LABORATORY DATA:   I have reviewed the data as listed.  Results for orders placed or performed in visit on 07/12/23  CEA (Access)  Result Value Ref Range   CEA ([facility]) 2.22 0.00 - 5.00 ng/mL  Lactate dehydrogenase  Result Value Ref Range   LDH 197 (H) 98 - 192 U/L  CBC with Differential (Cancer Center Only)  Result Value Ref Range    WBC Count 5.0 4.0 - 10.5 K/uL   RBC 3.68 (L) 3.87 - 5.11 MIL/uL   Hemoglobin 12.1 12.0 - 15.0 g/dL   HCT 16.1 (L) 09.6 - 04.5 %   MCV 94.6 80.0 - 100.0 fL   MCH 32.9 26.0 - 34.0 pg   MCHC 34.8 30.0 - 36.0 g/dL   RDW 40.9 81.1 - 91.4 %   Platelet Count 138 (L) 150 - 400 K/uL   nRBC 0.0 0.0 - 0.2 %   Neutrophils Relative % 35 %   Neutro Abs 1.8 1.7 - 7.7 K/uL   Lymphocytes Relative 47 %   Lymphs Abs 2.3 0.7 - 4.0 K/uL   Monocytes Relative 17 %   Monocytes Absolute 0.8 0.1 - 1.0 K/uL   Eosinophils Relative 0 %   Eosinophils Absolute 0.0 0.0 - 0.5 K/uL   Basophils Relative 0 %   Basophils Absolute 0.0 0.0 - 0.1 K/uL   Immature Granulocytes 1 %   Abs Immature Granulocytes 0.04 0.00 - 0.07 K/uL  CMP (Cancer Center only)  Result Value Ref Range   Sodium 141 135 - 145 mmol/L   Potassium 3.9 3.5 - 5.1 mmol/L   Chloride 102 98 - 111 mmol/L   CO2 26 22 - 32 mmol/L   Glucose, Bld 107 (H) 70 - 99 mg/dL   BUN 26 (H) 8 - 23 mg/dL   Creatinine 7.82 (H) 9.56 - 1.00 mg/dL   Calcium  9.8 8.9 - 10.3 mg/dL   Total Protein 7.5 6.5 - 8.1 g/dL   Albumin 4.4 3.5 - 5.0 g/dL   AST 22 15 - 41 U/L   ALT 10 0 - 44 U/L   Alkaline Phosphatase 69 38 - 126 U/L   Total Bilirubin 0.3 0.0 - 1.2 mg/dL   GFR, Estimated 55 (L) >60 mL/min   Anion gap 12 5 - 15     RADIOGRAPHIC STUDIES:  No recent pertinent imaging studies available to review.  No orders of the defined  types were placed in this encounter.    Future Appointments  Date Time Provider Department Center  09/12/2023 12:30 PM Paci, Lenore Rafter, MD [facility] None     This document was completed utilizing speech recognition software. Grammatical errors, random word insertions, pronoun errors, and incomplete sentences are an occasional consequence of this system due to software limitations, ambient noise, and hardware issues. Any formal questions or concerns about the content, text or information contained within the body of this dictation should  be directly addressed to the provider for clarification.

## 2023-09-12 ENCOUNTER — Ambulatory Visit: Admitting: Dermatology

## 2023-09-12 ENCOUNTER — Encounter: Payer: Self-pay | Admitting: Dermatology

## 2023-09-12 VITALS — BP 108/70

## 2023-09-12 DIAGNOSIS — H61001 Unspecified perichondritis of right external ear: Secondary | ICD-10-CM

## 2023-09-12 DIAGNOSIS — L821 Other seborrheic keratosis: Secondary | ICD-10-CM | POA: Diagnosis not present

## 2023-09-12 NOTE — Progress Notes (Signed)
   New Patient Visit   Subjective  Virgen Belland is a 83 y.o. female who presents for the following: lesion on the ear  Patient states she has a sore located at the right ear that she would like to have examined. Patient reports the areas have been there for 3 months. She reports the areas are bothersome.. Patient reports she has not previously been treated for these areas. Patient does not have Hx of bx. Patient denies family history of skin cancer(s). She has been using a silk pillow.   Patient states that there is a scaly on her left medial ankle. It improves after the shower. She states that she has been applying magnesium cream and an antibiotic ointment. This was last used a few days ago.  She states that this have made slight improvements.   The patient has spots, moles and lesions to be evaluated, some may be new or changing.  The following portions of the chart were reviewed this encounter and updated as appropriate: medications, allergies, medical history  Review of Systems:  No other skin or systemic complaints except as noted in HPI or Assessment and Plan.  Objective  Well appearing patient in no apparent distress; mood and affect are within normal limits.  A focused examination was performed of the following areas: Right ear and left ankle  Relevant exam findings are noted in the Assessment and Plan.  Left Ankle - Anterior Benign skin thickening.    Assessment & Plan   CHONDRODERMATITIS NODULARIS HELICIS Exam: Scaly pink papule or plaque overlying prominent cartilage at the right ear  Chondrodermatitis Nodularis Chronica Helicis (CNCH or CNH) is a common, benign inflammatory condition of the ear cartilage and overlying skin associated with very sensitive tender papule(s).  Trauma or pressure from sleeping on the ear or from cell phone use and sun damage may be exacerbating factors.  Treatment may include using a C-shaped airplane neck pillow for sleeping on the  side of the head so no pressure is on the ear.  Other treatments include topical or intralesional steroids; liquid nitrogen or laser destruction; shave removal or excision.  The condition can be difficult to treat and persist or recur despite treatment.  Treatment Plan: Cryotherapy Today -Recommend donut pillow  STUCCO KERATOSES- lower leg - Stuck-on, waxy, tan-brown papules and/or plaques  - Benign-appearing - Discussed benign etiology and prognosis. - Observe - Call for any changes  STUCCO KERATOSES Left Ankle - Anterior - recommend smoothing cream.  CHONDRODERMATITIS NODULARIS CHRONICA HELICIS, RIGHT Right Superior Helix Destruction of lesion - Right Superior Helix Complexity: simple   Destruction method: cryotherapy   Informed consent: discussed and consent obtained   Timeout:  patient name, date of birth, surgical site, and procedure verified Lesion destroyed using liquid nitrogen: Yes   Region frozen until ice ball extended beyond lesion: Yes   Outcome: patient tolerated procedure well with no complications   Post-procedure details: wound care instructions given     Return in about 4 weeks (around 10/10/2023) for UBSE and recheck the ear.  LILLETTE Rollene Gobble, RN, am acting as scribe for RUFUS CHRISTELLA HOLY, MD .   Documentation: I have reviewed the above documentation for accuracy and completeness, and I agree with the above.  RUFUS CHRISTELLA HOLY, MD

## 2023-09-12 NOTE — Patient Instructions (Addendum)
 Lower Leg- Cereve SA cream or Amlactin Right Ear- chronic nodularis helicis   For areas treated with Liquid Nitrogen:  Keep clean with soap and water .  Apply Vaseline or Aquaphor twice daily.    Important Information  Due to recent changes in healthcare laws, you may see results of your pathology and/or laboratory studies on MyChart before the doctors have had a chance to review them. We understand that in some cases there may be results that are confusing or concerning to you. Please understand that not all results are received at the same time and often the doctors may need to interpret multiple results in order to provide you with the best plan of care or course of treatment. Therefore, we ask that you please give us  2 business days to thoroughly review all your results before contacting the office for clarification. Should we see a critical lab result, you will be contacted sooner.   If You Need Anything After Your Visit  If you have any questions or concerns for your doctor, please call our main line at [phone] If no one answers, please leave a voicemail as directed and we will return your call as soon as possible. Messages left after 4 pm will be answered the following business day.   You may also send us  a message via MyChart. We typically respond to MyChart messages within 1-2 business days.  For prescription refills, please ask your pharmacy to contact our office. Our fax number is [phone].  If you have an urgent issue when the clinic is closed that cannot wait until the next business day, you can page your doctor at the number below.    Please note that while we do our best to be available for urgent issues outside of office hours, we are not available 24/7.   If you have an urgent issue and are unable to reach us , you may choose to seek medical care at your doctor's office, retail clinic, urgent care center, or emergency room.  If you have a medical emergency, please  immediately call 911 or go to the emergency department. In the event of inclement weather, please call our main line at [phone] for an update on the status of any delays or closures.  Dermatology Medication Tips: Please keep the boxes that topical medications come in in order to help keep track of the instructions about where and how to use these. Pharmacies typically print the medication instructions only on the boxes and not directly on the medication tubes.   If your medication is too expensive, please contact our office at [phone] or send us  a message through MyChart.   We are unable to tell what your co-pay for medications will be in advance as this is different depending on your insurance coverage. However, we may be able to find a substitute medication at lower cost or fill out paperwork to get insurance to cover a needed medication.   If a prior authorization is required to get your medication covered by your insurance company, please allow us  1-2 business days to complete this process.  Drug prices often vary depending on where the prescription is filled and some pharmacies may offer cheaper prices.  The website www.goodrx.com contains coupons for medications through different pharmacies. The prices here do not account for what the cost may be with help from insurance (it may be cheaper with your insurance), but the website can give you the price if you did not use any insurance.  - You  can print the associated coupon and take it with your prescription to the pharmacy.  - You may also stop by our office during regular business hours and pick up a GoodRx coupon card.  - If you need your prescription sent electronically to a different pharmacy, notify our office through [facility] or by phone at [phone]

## 2023-10-11 ENCOUNTER — Encounter: Payer: Self-pay | Admitting: Gastroenterology

## 2023-10-17 ENCOUNTER — Ambulatory Visit: Admitting: Dermatology

## 2023-10-17 ENCOUNTER — Encounter: Payer: Self-pay | Admitting: Dermatology

## 2023-10-17 VITALS — BP 130/76 | HR 62

## 2023-10-17 DIAGNOSIS — Z1283 Encounter for screening for malignant neoplasm of skin: Secondary | ICD-10-CM | POA: Diagnosis not present

## 2023-10-17 DIAGNOSIS — L814 Other melanin hyperpigmentation: Secondary | ICD-10-CM | POA: Diagnosis not present

## 2023-10-17 DIAGNOSIS — W908XXA Exposure to other nonionizing radiation, initial encounter: Secondary | ICD-10-CM | POA: Diagnosis not present

## 2023-10-17 DIAGNOSIS — L578 Other skin changes due to chronic exposure to nonionizing radiation: Secondary | ICD-10-CM | POA: Diagnosis not present

## 2023-10-17 DIAGNOSIS — L821 Other seborrheic keratosis: Secondary | ICD-10-CM

## 2023-10-17 DIAGNOSIS — D1801 Hemangioma of skin and subcutaneous tissue: Secondary | ICD-10-CM | POA: Diagnosis not present

## 2023-10-17 DIAGNOSIS — H61001 Unspecified perichondritis of right external ear: Secondary | ICD-10-CM

## 2023-10-17 DIAGNOSIS — D229 Melanocytic nevi, unspecified: Secondary | ICD-10-CM

## 2023-10-17 NOTE — Progress Notes (Signed)
   Follow-Up Visit   Subjective  Lisa Osborne is a 83 y.o. female who presents for the following: Skin Cancer Screening and Upper Body Skin Exam  The patient presents for Upper Body Skin Exam (UBSE) for skin cancer screening and mole check. The patient has spots, moles and lesions to be evaluated, some may be new or changing.  The following portions of the chart were reviewed this encounter and updated as appropriate: medications, allergies, medical history  Review of Systems:  No other skin or systemic complaints except as noted in HPI or Assessment and Plan.  Objective  Well appearing patient in no apparent distress; mood and affect are within normal limits.  All skin waist up examined. Relevant physical exam findings are noted in the Assessment and Plan.    Assessment & Plan    Skin cancer screening performed today.  Actinic Damage - Chronic condition, secondary to cumulative UV/sun exposure - diffuse scaly erythematous macules with underlying dyspigmentation - Recommend daily broad spectrum sunscreen SPF 30+ to sun-exposed areas, reapply every 2 hours as needed.  - Staying in the shade or wearing long sleeves, sun glasses (UVA+UVB protection) and wide brim hats (4-inch brim around the entire circumference of the hat) are also recommended for sun protection.  - Call for new or changing lesions.  Melanocytic Nevi - Tan-brown and/or pink-flesh-colored symmetric macules and papules - Benign appearing on exam today - Observation - Call clinic for new or changing moles - Recommend daily use of broad spectrum spf 30+ sunscreen to sun-exposed areas.   LENTIGINES Exam: scattered tan macules Due to sun exposure Treatment Plan: Benign-appearing, observe. Recommend daily broad spectrum sunscreen SPF 30+ to sun-exposed areas, reapply every 2 hours as needed.  Call for any changes   HEMANGIOMA Exam: red papule(s) Discussed benign nature. Recommend observation. Call for  changes.   SEBORRHEIC KERATOSIS - Stuck-on, waxy, tan-brown papules and/or plaques  - Benign-appearing - Discussed benign etiology and prognosis. - Observe - Call for any changes  Chondrodermatitis Nodularis Chronica Helicis right ear   Clear today  Return in about 1 year (around 10/16/2024) for UBSE.  I, Darice Smock, CMA, am acting as scribe for RUFUS CHRISTELLA HOLY, MD.   Documentation: I have reviewed the above documentation for accuracy and completeness, and I agree with the above.  RUFUS CHRISTELLA HOLY, MD

## 2023-10-17 NOTE — Patient Instructions (Signed)

## 2023-10-21 ENCOUNTER — Encounter: Payer: Self-pay | Admitting: Physician Assistant

## 2023-11-08 NOTE — Progress Notes (Unsigned)
 [name], PA-C [address] [place], [place]  [address] Phone: [phone]   Gastroenterology Consultation  Referring Provider:     Rilla Baller, MD Primary Care Physician:  Rilla Baller, MD Primary Gastroenterologist:  [name], PA-C / Elspeth Naval, MD  Reason for Consultation:     Discuss colonoscopy        HPI:   Lisa Osborne is a 83 y.o. y/o female referred for consultation & management  by Rilla Baller, MD.    New patient.  Here to discuss repeat colonoscopy.  Has prior history of stage III colon cancer in 2014 and previous history of adenomatous colon polyps in 2018.  09/2020 last colonoscopy by Dr. Naval: 10 small (3 mm to 5 mm) tubular adenoma polyps removed throughout the colon.  Adequate prep.  Internal hemorrhoids.  Healthy appearing mucosa at the anastomosis in the distal sigmoid colon.  3-year repeat (due 09/2023).  Discussed the use of AI scribe software for clinical note transcription with the patient, who gave verbal consent to proceed.  History of Present Illness    PMH: CAD, aortic atherosclerosis, RBBB, GERD, hypothyroidism, type 2 diabetes, OA, CKD,  Past Medical History:  Diagnosis Date   Abnormal EKG    incomplete RBBB   Acquired deafness of left ear    Acute cholecystitis    Allergy    Anemia    Anxiety    Arthritis    left side lower back pain   Brown recluse spider bite 03/2018   Colon adenocarcinoma - rectosigmoid T3N1 (1/15) s/p lap LAR 04/14/2012   Status post laparoscopic lower anterior resection   Dehydration 02/2015   Depression    severe in past   Diabetes mellitus type 2, controlled (HCC) 2006   diet controlled   GERD (gastroesophageal reflux disease)    bad if off PPI   History of chicken pox    History of iron deficiency anemia    HLD (hyperlipidemia) 08/03/2015   Hypothyroidism    mild   Laceration of fifth toe, right 08/26/2017   Neuromuscular disorder (HCC)    neuropathy  both great toes   Personal history of chemotherapy    Rosacea    Seasonal allergies    Unilateral deafnesses    left ear deaf, wears hearing aid right side   Urine incontinence    mild, wears pad    Past Surgical History:  Procedure Laterality Date   ABDOMINAL HYSTERECTOMY     Approx 1980?   ABDOMINOPLASTY  1996   CHOLECYSTECTOMY N/A 03/20/2015   Procedure: LAPAROSCOPIC CHOLECYSTECTOMY;  Surgeon: Charlie FORBES Fell, MD;  Location: [facility] ORS;  Service: General;  Laterality: N/A;   COLONOSCOPY  08/2006   normal   COLONOSCOPY  03/2013   2 polyps, mild diverticulosis, rpt 3 yrs Verda)   COLONOSCOPY  03/2016   5 TAs, fair prep, patent anastomosis, rpt 6-12 mo (Armbruster)   COLONOSCOPY  01/2017   diverticulosis, healthy anastomosis, rpt 5 yrs (Armbruster)   COLONOSCOPY  09/2020   TA x10, int hem, consider rpt 3 yrs (Armbruster)   FACELIFT  05/2011   INCONTINENCE SURGERY  1996   LAPAROSCOPIC LOW ANTERIOR RESECTION N/A 04/14/2012   colon cancer at rectosigmoid junction s/p rigid proctoscopy splenic flexure mobilization lysis of adhesion;  Surgeon: Elspeth KYM Schultze, MD;    MOUTH SURGERY     crown repair   TOTAL VAGINAL HYSTERECTOMY  1985   cancer cells on cervix  Prior to Admission medications   Medication Sig Start Date End Date Taking? Authorizing Provider  atorvastatin  (LIPITOR) 10 MG tablet Take 1 tablet (10 mg total) by mouth daily. 04/01/23   Rilla Baller, MD  Calcium  Citrate-Vitamin D  (CALCIUM  CITRATE + D PO) Take 1 tablet by mouth daily.     [provider]  Cholecalciferol  (VITAMIN D3) 25 MCG (1000 UT) CAPS Take 1 capsule (1,000 Units total) by mouth daily. 04/01/23   Rilla Baller, MD  Coenzyme Q10 (COQ10) 200 MG CAPS Take 1 capsule by mouth daily.     [provider]  Ferrous Sulfate (IRON) 325 (65 Fe) MG TABS Take by mouth daily.    [provider]  lansoprazole  (PREVACID ) 15 MG capsule Take 1 capsule (15 mg total) by mouth daily. 04/01/23    Rilla Baller, MD  levothyroxine  (SYNTHROID ) 25 MCG tablet Take 1 tablet (25 mcg total) by mouth daily. 04/01/23   Rilla Baller, MD  lisinopril  (ZESTRIL ) 5 MG tablet Take 1 tablet (5 mg total) by mouth daily. 04/01/23   Rilla Baller, MD  Melatonin 5 MG TABS Take 5 mg by mouth at bedtime.     [provider]  Omega-3 Fatty Acids (FISH OIL) 1000 MG CAPS Take 1 capsule by mouth daily.    [provider]  Semaglutide ,0.25 or 0.5MG /DOS, (OZEMPIC , 0.25 OR 0.5 MG/DOSE,) 2 MG/3ML SOPN Inject 0.5 mg as directed once a week. 04/01/23   Rilla Baller, MD  sertraline  (ZOLOFT ) 100 MG tablet Take 1 tablet (100 mg total) by mouth daily. 04/01/23   Rilla Baller, MD  vitamin B-12 (CYANOCOBALAMIN ) 1000 MCG tablet Take 1,000 mcg by mouth daily.    [provider]    Family History  Problem Relation Age of Onset   Breast cancer Mother 49       breast cancer then multiple myeloma   Alcohol abuse Father    Heart disease Father        heart failure   Mental illness Brother        2 brothers severe depression, suicide x2   Diabetes Maternal Aunt    Diabetes Maternal Uncle    Diabetes Paternal Uncle    Diabetes Paternal Grandfather    Coronary artery disease Neg Hx    Stroke Neg Hx    Colon cancer Neg Hx    Esophageal cancer Neg Hx    Rectal cancer Neg Hx    Stomach cancer Neg Hx    Liver disease Neg Hx    Pancreatic cancer Neg Hx    Prostate cancer Neg Hx    Colon polyps Neg Hx      Social History   Tobacco Use   Smoking status: Former    Current packs/day: 0.00    Types: Cigarettes    Quit date: 04/06/1967    Years since quitting: 56.6   Smokeless tobacco: Never  Vaping Use   Vaping status: Never Used  Substance Use Topics   Alcohol use: Yes    Alcohol/week: 1.0 standard drink of alcohol    Types: 1 Shots of liquor per week    Comment: 1 per week   Drug use: Never    Allergies as of 11/09/2023 - Review Complete 10/17/2023  Allergen Reaction  Noted   Latex  11/29/2010   Nickel  11/26/2010    Review of Systems:    All systems reviewed and negative except where noted in HPI.   Physical Exam:  There were no vitals taken for  this visit. No LMP recorded. Patient has had a hysterectomy.  General:   Alert,  Well-developed, well-nourished, pleasant and cooperative in NAD Lungs:  Respirations even and unlabored.  Clear throughout to auscultation.   No wheezes, crackles, or rhonchi. No acute distress. Heart:  Regular rate and rhythm; no murmurs, clicks, rubs, or gallops. Abdomen:  Normal bowel sounds.  No bruits.  Soft, and non-distended without masses, hepatosplenomegaly or hernias noted.  No Tenderness.  No guarding or rebound tenderness.    Neurologic:  Alert and oriented x3;  grossly normal neurologically. Psych:  Alert and cooperative. Normal mood and affect.  Imaging Studies: No results found.  Labs: CBC    Component Value Date/Time   WBC 5.0 07/12/2023 1425   WBC 6.3 03/25/2023 0827   RBC 3.68 (L) 07/12/2023 1425   HGB 12.1 07/12/2023 1425   HGB 12.4 08/11/2015 1025   HCT 34.8 (L) 07/12/2023 1425   HCT 36.9 08/11/2015 1025   PLT 138 (L) 07/12/2023 1425   PLT 186 08/11/2015 1025   MCV 94.6 07/12/2023 1425   MCV 94.0 08/11/2015 1025    CMP     Component Value Date/Time   NA 141 07/12/2023 1425   NA 140 08/11/2015 1036   K 3.9 07/12/2023 1425   K 3.9 08/11/2015 1036   CL 102 07/12/2023 1425   CL 107 07/06/2012 1428   CO2 26 07/12/2023 1425   CO2 25 08/11/2015 1036   GLUCOSE 107 (H) 07/12/2023 1425   GLUCOSE 185 (H) 08/11/2015 1036   GLUCOSE 126 (H) 07/06/2012 1428   BUN 26 (H) 07/12/2023 1425   BUN 18.0 08/11/2015 1036   CREATININE 1.02 (H) 07/12/2023 1425   CREATININE 1.2 (H) 08/11/2015 1036   CALCIUM  9.8 07/12/2023 1425   CALCIUM  9.0 08/11/2015 1036   PROT 7.5 07/12/2023 1425   PROT 7.3 03/11/2014 0956   ALBUMIN 4.4 07/12/2023 1425   ALBUMIN 3.9 03/11/2014 0956   AST 22 07/12/2023 1425   AST 16  03/11/2014 0956   ALT 10 07/12/2023 1425   ALT 9 03/11/2014 0956   ALKPHOS 69 07/12/2023 1425   ALKPHOS 81 03/11/2014 0956   BILITOT 0.3 07/12/2023 1425   BILITOT 0.28 03/11/2014 0956   GFRNONAA 55 (L) 07/12/2023 1425   GFRAA >60 03/21/2015 0620    Assessment and Plan:   Pascha Fogal is a 83 y.o. y/o female has been referred for   1.  History of colon cancer (2014) 2.  History of multiple adenomatous colon polyps (2018, 2021)  Plan: - Scheduling Colonoscopy I discussed risks of colonoscopy with patient to include risk of bleeding, colon perforation, and risk of sedation.  Patient expressed understanding and agrees to proceed with colonoscopy.   Follow up ***  [name], PA-C

## 2023-11-09 ENCOUNTER — Ambulatory Visit: Admitting: Physician Assistant

## 2023-11-09 ENCOUNTER — Encounter: Payer: Self-pay | Admitting: Physician Assistant

## 2023-11-09 VITALS — BP 98/70 | HR 70 | Ht 66.0 in | Wt 183.4 lb

## 2023-11-09 DIAGNOSIS — Z1211 Encounter for screening for malignant neoplasm of colon: Secondary | ICD-10-CM | POA: Diagnosis not present

## 2023-11-09 DIAGNOSIS — Z860101 Personal history of adenomatous and serrated colon polyps: Secondary | ICD-10-CM

## 2023-11-09 DIAGNOSIS — Z860100 Personal history of colon polyps, unspecified: Secondary | ICD-10-CM

## 2023-11-09 DIAGNOSIS — Z85038 Personal history of other malignant neoplasm of large intestine: Secondary | ICD-10-CM | POA: Diagnosis not present

## 2023-11-09 MED ORDER — NA SULFATE-K SULFATE-MG SULF 17.5-3.13-1.6 GM/177ML PO SOLN
1.0000 | Freq: Once | ORAL | 0 refills | Status: AC
Start: 2023-11-09 — End: 2023-11-09

## 2023-11-09 NOTE — Patient Instructions (Signed)
 You have been scheduled for a Colonoscopy. Please follow written instructions given to you at your visit today.   If you use inhalers (even only as needed), please bring them with you on the day of your procedure.  DO NOT TAKE 7 DAYS PRIOR TO TEST- Trulicity (dulaglutide) Ozempic , Wegovy (semaglutide ) Mounjaro (tirzepatide) Bydureon Bcise (exanatide extended release)  DO NOT TAKE 1 DAY PRIOR TO YOUR TEST Rybelsus (semaglutide ) Adlyxin (lixisenatide) Victoza (liraglutide) Byetta (exanatide) ___________________________________________________________________________  Please follow up sooner if symptoms increase or worsen   Due to recent changes in healthcare laws, you may see the results of your imaging and laboratory studies on MyChart before your provider has had a chance to review them.  We understand that in some cases there may be results that are confusing or concerning to you. Not all laboratory results come back in the same time frame and the provider may be waiting for multiple results in order to interpret others.  Please give us  48 hours in order for your provider to thoroughly review all the results before contacting the office for clarification of your results.   Thank you for trusting me with your gastrointestinal care!   [name], PA-C _______________________________________________________  If your blood pressure at your visit was 140/90 or greater, please contact your primary care physician to follow up on this.  _______________________________________________________  If you are age 28 or older, your body mass index should be between 23-30. Your Body mass index is 29.6 kg/m. If this is out of the aforementioned range listed, please consider follow up with your Primary Care Provider.  If you are age 15 or younger, your body mass index should be between 19-25. Your Body mass index is 29.6 kg/m. If this is out of the aformentioned range listed, please consider  follow up with your Primary Care Provider.   ________________________________________________________  The  GI providers would like to encourage you to use MYCHART to communicate with providers for non-urgent requests or questions.  Due to long hold times on the telephone, sending your provider a message by [facility] may be a faster and more efficient way to get a response.  Please allow 48 business hours for a response.  Please remember that this is for non-urgent requests.  _______________________________________________________

## 2023-11-10 NOTE — Progress Notes (Signed)
 Agree with assessment and plan as outlined.

## 2023-11-21 DIAGNOSIS — H21233 Degeneration of iris (pigmentary), bilateral: Secondary | ICD-10-CM | POA: Diagnosis not present

## 2023-11-21 DIAGNOSIS — H5203 Hypermetropia, bilateral: Secondary | ICD-10-CM | POA: Diagnosis not present

## 2023-11-21 DIAGNOSIS — E119 Type 2 diabetes mellitus without complications: Secondary | ICD-10-CM | POA: Diagnosis not present

## 2023-11-21 DIAGNOSIS — H25033 Anterior subcapsular polar age-related cataract, bilateral: Secondary | ICD-10-CM | POA: Diagnosis not present

## 2023-11-21 DIAGNOSIS — H25013 Cortical age-related cataract, bilateral: Secondary | ICD-10-CM | POA: Diagnosis not present

## 2023-11-21 DIAGNOSIS — H2513 Age-related nuclear cataract, bilateral: Secondary | ICD-10-CM | POA: Diagnosis not present

## 2023-11-21 DIAGNOSIS — H524 Presbyopia: Secondary | ICD-10-CM | POA: Diagnosis not present

## 2023-11-21 LAB — OPHTHALMOLOGY REPORT-SCANNED

## 2024-01-05 ENCOUNTER — Other Ambulatory Visit: Payer: Self-pay | Admitting: Medical Genetics

## 2024-01-05 ENCOUNTER — Encounter: Payer: Self-pay | Admitting: Gastroenterology

## 2024-01-12 ENCOUNTER — Encounter: Payer: Self-pay | Admitting: Gastroenterology

## 2024-01-12 ENCOUNTER — Ambulatory Visit: Admitting: Gastroenterology

## 2024-01-12 VITALS — BP 122/55 | HR 57 | Temp 97.3°F | Resp 11 | Ht 66.0 in | Wt 183.0 lb

## 2024-01-12 DIAGNOSIS — Z85038 Personal history of other malignant neoplasm of large intestine: Secondary | ICD-10-CM

## 2024-01-12 DIAGNOSIS — Z860100 Personal history of colon polyps, unspecified: Secondary | ICD-10-CM | POA: Diagnosis not present

## 2024-01-12 DIAGNOSIS — Z1211 Encounter for screening for malignant neoplasm of colon: Secondary | ICD-10-CM

## 2024-01-12 MED ORDER — NA SULFATE-K SULFATE-MG SULF 17.5-3.13-1.6 GM/177ML PO SOLN: 1.0000 | Freq: Once | ORAL | 0 refills | Status: AC

## 2024-01-12 MED ORDER — SODIUM CHLORIDE 0.9 % IV SOLN: 500.0000 mL | INTRAVENOUS | Status: AC

## 2024-01-12 MED ORDER — ONDANSETRON HCL 4 MG PO TABS: 4.0000 mg | ORAL_TABLET | Freq: Three times a day (TID) | ORAL | 0 refills | Status: DC | PRN

## 2024-01-12 NOTE — Progress Notes (Signed)
 [facility] History and Physical   Primary Care Physician:  Rilla Baller, MD   Reason for Procedure:   History of colon polyps, history of colon cancer  Plan:    colonoscopy     HPI: Lisa Osborne is a 83 y.o. female  here for colonoscopy surveillance - 10 adenomas removed 09/2020, remote history of colon cancer dx 2014.   Patient denies any bowel symptoms at this time. Otherwise feels well without any cardiopulmonary symptoms.   I have discussed risks / benefits of anesthesia and endoscopic procedure with Rudell Kayla Lefort and they wish to proceed with the exams as outlined today.   The patient was provided an opportunity to ask questions and all were answered. The patient agreed with the plan.    Past Medical History:  Diagnosis Date   Abnormal EKG    incomplete RBBB   Acquired deafness of left ear    Acute cholecystitis    Allergy    Anemia    Anxiety    Arthritis    left side lower back pain   Brown recluse spider bite 03/2018   Colon adenocarcinoma - rectosigmoid T3N1 (1/15) s/p lap LAR 04/14/2012   Status post laparoscopic lower anterior resection   Dehydration 02/2015   Depression    severe in past   Diabetes mellitus type 2, controlled (HCC) 2006   diet controlled   GERD (gastroesophageal reflux disease)    bad if off PPI   History of chicken pox    History of iron deficiency anemia    HLD (hyperlipidemia) 08/03/2015   Hypothyroidism    mild   Laceration of fifth toe, right 08/26/2017   Neuromuscular disorder (HCC)    neuropathy both great toes   Personal history of chemotherapy    Rosacea    Seasonal allergies    Unilateral deafnesses    left ear deaf, wears hearing aid right side   Urine incontinence    mild, wears pad    Past Surgical History:  Procedure Laterality Date   ABDOMINAL HYSTERECTOMY     Approx 1980?   ABDOMINOPLASTY  1996   CHOLECYSTECTOMY N/A 03/20/2015   Procedure: LAPAROSCOPIC CHOLECYSTECTOMY;   Surgeon: Charlie FORBES Fell, MD;  Location: [facility] ORS;  Service: General;  Laterality: N/A;   COLONOSCOPY  08/2006   normal   COLONOSCOPY  03/2013   2 polyps, mild diverticulosis, rpt 3 yrs Verda)   COLONOSCOPY  03/2016   5 TAs, fair prep, patent anastomosis, rpt 6-12 mo (Marcelo Ickes)   COLONOSCOPY  01/2017   diverticulosis, healthy anastomosis, rpt 5 yrs (Keyion Knack)   COLONOSCOPY  09/2020   TA x10, int hem, consider rpt 3 yrs (Axtyn Woehler)   FACELIFT  05/2011   INCONTINENCE SURGERY  1996   LAPAROSCOPIC LOW ANTERIOR RESECTION N/A 04/14/2012   colon cancer at rectosigmoid junction s/p rigid proctoscopy splenic flexure mobilization lysis of adhesion;  Surgeon: Elspeth KYM Schultze, MD;    MOUTH SURGERY     crown repair   TOTAL VAGINAL HYSTERECTOMY  1985   cancer cells on cervix    Prior to Admission medications  Medication Sig Start Date End Date Taking? Authorizing Provider  atorvastatin  (LIPITOR) 10 MG tablet Take 1 tablet (10 mg total) by mouth daily. 04/01/23  Yes Rilla Baller, MD  Calcium  Citrate-Vitamin D  (CALCIUM  CITRATE + D PO) Take 1 tablet by mouth daily.    Yes [provider]  Cholecalciferol  (VITAMIN D3) 25 MCG (1000 UT) CAPS Take 1 capsule (1,000 Units  total) by mouth daily. 04/01/23  Yes Rilla Baller, MD  Coenzyme Q10 (COQ10) 200 MG CAPS Take 1 capsule by mouth daily.    Yes [provider]  lansoprazole  (PREVACID ) 15 MG capsule Take 1 capsule (15 mg total) by mouth daily. 04/01/23  Yes Rilla Baller, MD  levothyroxine  (SYNTHROID ) 25 MCG tablet Take 1 tablet (25 mcg total) by mouth daily. 04/01/23  Yes Rilla Baller, MD  lisinopril  (ZESTRIL ) 5 MG tablet Take 1 tablet (5 mg total) by mouth daily. 04/01/23  Yes Rilla Baller, MD  Melatonin 5 MG TABS Take 5 mg by mouth at bedtime.    Yes [provider]  Omega-3 Fatty Acids (FISH OIL) 1000 MG CAPS Take 1 capsule by mouth daily.   Yes [provider]  sertraline  (ZOLOFT ) 100 MG tablet  Take 1 tablet (100 mg total) by mouth daily. 04/01/23  Yes Rilla Baller, MD  vitamin B-12 (CYANOCOBALAMIN ) 1000 MCG tablet Take 1,000 mcg by mouth daily.   Yes [provider]  Ferrous Sulfate (IRON) 325 (65 Fe) MG TABS Take by mouth daily.    [provider]  Semaglutide ,0.25 or 0.5MG /DOS, (OZEMPIC , 0.25 OR 0.5 MG/DOSE,) 2 MG/3ML SOPN Inject 0.5 mg as directed once a week. 04/01/23   Rilla Baller, MD    Current Outpatient Medications  Medication Sig Dispense Refill   atorvastatin  (LIPITOR) 10 MG tablet Take 1 tablet (10 mg total) by mouth daily. 90 tablet 4   Calcium  Citrate-Vitamin D  (CALCIUM  CITRATE + D PO) Take 1 tablet by mouth daily.      Cholecalciferol  (VITAMIN D3) 25 MCG (1000 UT) CAPS Take 1 capsule (1,000 Units total) by mouth daily.     Coenzyme Q10 (COQ10) 200 MG CAPS Take 1 capsule by mouth daily.      lansoprazole  (PREVACID ) 15 MG capsule Take 1 capsule (15 mg total) by mouth daily. 90 capsule 4   levothyroxine  (SYNTHROID ) 25 MCG tablet Take 1 tablet (25 mcg total) by mouth daily. 90 tablet 4   lisinopril  (ZESTRIL ) 5 MG tablet Take 1 tablet (5 mg total) by mouth daily. 90 tablet 3   Melatonin 5 MG TABS Take 5 mg by mouth at bedtime.      Omega-3 Fatty Acids (FISH OIL) 1000 MG CAPS Take 1 capsule by mouth daily.     sertraline  (ZOLOFT ) 100 MG tablet Take 1 tablet (100 mg total) by mouth daily. 90 tablet 4   vitamin B-12 (CYANOCOBALAMIN ) 1000 MCG tablet Take 1,000 mcg by mouth daily.     Ferrous Sulfate (IRON) 325 (65 Fe) MG TABS Take by mouth daily.     Semaglutide ,0.25 or 0.5MG /DOS, (OZEMPIC , 0.25 OR 0.5 MG/DOSE,) 2 MG/3ML SOPN Inject 0.5 mg as directed once a week. 6 mL 3   Current Facility-Administered Medications  Medication Dose Route Frequency Provider Last Rate Last Admin   0.9 %  sodium chloride  infusion  500 mL Intravenous Continuous Lashonta Pilling, Elspeth SQUIBB, MD        Allergies as of 01/12/2024 - Review Complete 01/12/2024  Allergen Reaction  Noted   Latex  11/29/2010   Nickel  11/26/2010    Family History  Problem Relation Age of Onset   Breast cancer Mother 92       breast cancer then multiple myeloma   Alcohol abuse Father    Heart disease Father        heart failure   Mental illness Brother        2 brothers severe depression, suicide x2  Diabetes Maternal Aunt    Diabetes Maternal Uncle    Diabetes Paternal Uncle    Diabetes Paternal Grandfather    Coronary artery disease Neg Hx    Stroke Neg Hx    Colon cancer Neg Hx    Esophageal cancer Neg Hx    Rectal cancer Neg Hx    Stomach cancer Neg Hx    Liver disease Neg Hx    Pancreatic cancer Neg Hx    Prostate cancer Neg Hx    Colon polyps Neg Hx     Social History   Socioeconomic History   Marital status: Single    Spouse name: Not on file   Number of children: 1   Years of education: Not on file   Highest education level: Not on file  Occupational History   Occupation: Retired Magazine Features Editor: RETIRED  Tobacco Use   Smoking status: Former    Current packs/day: 0.00    Types: Cigarettes    Quit date: 04/06/1967    Years since quitting: 56.8   Smokeless tobacco: Never  Vaping Use   Vaping status: Never Used  Substance and Sexual Activity   Alcohol use: Yes    Alcohol/week: 1.0 standard drink of alcohol    Types: 1 Shots of liquor per week    Comment: 1 per week   Drug use: Never   Sexual activity: Not Currently    Birth control/protection: Pill  Other Topics Concern   Not on file  Social History Narrative   Caffeine: 1 cup coffee/day   Lives alone.  1 daughter Jesusa), 3 stillborns   Occupation: retired, was Air Traffic Controller.   Activity: no regular activity but does park far away, has treadmill at home   Diet: good fruits/vegetables, more fruit/vegetable smoothies  good amt water    Social Drivers of Health   Tobacco Use: Medium Risk (01/12/2024)   Patient History    Smoking Tobacco Use: Former    Smokeless Tobacco Use: Never     Passive Exposure: Not on file  Financial Resource Strain: Low Risk (12/24/2021)   Overall Financial Resource Strain (CARDIA)    Difficulty of Paying Living Expenses: Not hard at all  Food Insecurity: No Food Insecurity (04/19/2023)   Hunger Vital Sign    Worried About Running Out of Food in the Last Year: Never true    Ran Out of Food in the Last Year: Never true  Transportation Needs: No Transportation Needs (04/19/2023)   PRAPARE - Administrator, Civil Service (Medical): No    Lack of Transportation (Non-Medical): No  Physical Activity: Insufficiently Active (12/24/2021)   Exercise Vital Sign    Days of Exercise per Week: 2 days    Minutes of Exercise per Session: 30 min  Stress: No Stress Concern Present (12/24/2021)   [employer] of Occupational Health - Occupational Stress Questionnaire    Feeling of Stress : Only a little  Social Connections: Socially Isolated (12/24/2021)   Social Connection and Isolation Panel    Frequency of Communication with Friends and Family: More than three times a week    Frequency of Social Gatherings with Friends and Family: Once a week    Attends Religious Services: Never    Database Administrator or Organizations: No    Attends Banker Meetings: Never    Marital Status: Divorced  Catering Manager Violence: Not At Risk (04/19/2023)   Humiliation, Afraid, Rape, and Kick questionnaire    Fear of  Current or Ex-Partner: No    Emotionally Abused: No    Physically Abused: No    Sexually Abused: No  Depression (PHQ2-9): Low Risk (04/19/2023)   Depression (PHQ2-9)    PHQ-2 Score: 0  Alcohol Screen: Low Risk (12/24/2021)   Alcohol Screen    Last Alcohol Screening Score (AUDIT): 2  Housing: Unknown (04/19/2023)   Housing Stability Vital Sign    Unable to Pay for Housing in the Last Year: No    Number of Times Moved in the Last Year: Not on file    Homeless in the Last Year: No  Utilities: Not At Risk (04/19/2023)   [facility]  Utilities    Threatened with loss of utilities: No  Health Literacy: Not on file    Review of Systems: All other review of systems negative except as mentioned in the HPI.  Physical Exam: Vital signs BP (!) 91/49   Pulse 70   Temp (!) 97.3 F (36.3 C)   Ht 5' 6 (1.676 m)   Wt 183 lb (83 kg)   SpO2 96%   BMI 29.54 kg/m   General:   Alert,  Well-developed, pleasant and cooperative in NAD Lungs:  Clear throughout to auscultation.   Heart:  Regular rate and rhythm Abdomen:  Soft, nontender and nondistended.   Neuro/Psych:  Alert and cooperative. Normal mood and affect. A and O x 3  Marcey Naval, MD [facility]

## 2024-01-12 NOTE — Patient Instructions (Signed)
 YOU HAD AN ENDOSCOPIC PROCEDURE TODAY AT THE [facility]:   Refer to the procedure report that was given to you for any specific questions about what was found during the examination.  If the procedure report does not answer your questions, please call your gastroenterologist to clarify.  If you requested that your care partner not be given the details of your procedure findings, then the procedure report has been included in a sealed envelope for you to review at your convenience later.  YOU SHOULD EXPECT: Some feelings of bloating in the abdomen. Passage of more gas than usual.  Walking can help get rid of the air that was put into your GI tract during the procedure and reduce the bloating. If you had a lower endoscopy (such as a colonoscopy or flexible sigmoidoscopy) you may notice spotting of blood in your stool or on the toilet paper. If you underwent a bowel prep for your procedure, you may not have a normal bowel movement for a few days.  Please Note:  You might notice some irritation and congestion in your nose or some drainage.  This is from the oxygen used during your procedure.  There is no need for concern and it should clear up in a day or so.  SYMPTOMS TO REPORT IMMEDIATELY:  Following lower endoscopy (colonoscopy or flexible sigmoidoscopy):  Excessive amounts of blood in the stool  Significant tenderness or worsening of abdominal pains  Swelling of the abdomen that is new, acute  Fever of 100F or higher   Resume previous diet Continue present medications Repeat colonoscopy scheduled for January 9 at 1400 arrival time.   For urgent or emergent issues, a gastroenterologist can be reached at any hour by calling [phone]. Do not use MyChart messaging for urgent concerns.    DIET:  We do recommend a small meal at first, but then you may proceed to your regular diet.  Drink plenty of fluids but you should avoid alcoholic beverages for 24 hours.  ACTIVITY:  You  should plan to take it easy for the rest of today and you should NOT DRIVE or use heavy machinery until tomorrow (because of the sedation medicines used during the test).    FOLLOW UP: Our staff will call the number listed on your records the next business day following your procedure.  We will call around 7:15- 8:00 am to check on you and address any questions or concerns that you may have regarding the information given to you following your procedure. If we do not reach you, we will leave a message.     If any biopsies were taken you will be contacted by phone or by letter within the next 1-3 weeks.  Please call us  at [phone] if you have not heard about the biopsies in 3 weeks.    SIGNATURES/CONFIDENTIALITY: You and/or your care partner have signed paperwork which will be entered into your electronic medical record.  These signatures attest to the fact that that the information above on your After Visit Summary has been reviewed and is understood.  Full responsibility of the confidentiality of this discharge information lies with you and/or your care-partner.

## 2024-01-12 NOTE — Progress Notes (Signed)
 Transferred to PACU via stretcher.  Not responding to stimulation at this time.  VSS upon leaving procedure room.

## 2024-01-12 NOTE — Progress Notes (Signed)
 Rescheduled patient for 02/03/24 at 2 pm. Reviewed prep instructions with patient.

## 2024-01-12 NOTE — Op Note (Signed)
 [facility] Patient Name: Lisa Osborne Procedure Date: 01/12/2024 2:57 PM MRN: [id number] Endoscopist: Elspeth P. Leigh , MD, [id number] Age: 83 Referring MD:  Date of Birth: 1940-05-17 Gender: Female Account #: [id number] Procedure:                Colonoscopy Indications:              High risk colon cancer surveillance: Personal                            history of colonic polyps - 10 polyps removed                            09/2020, history of colon cancer 2014 Medicines:                Monitored Anesthesia Care Procedure:                Pre-Anesthesia Assessment:                           - Prior to the procedure, a History and Physical                            was performed, and patient medications and                            allergies were reviewed. The patient's tolerance of                            previous anesthesia was also reviewed. The risks                            and benefits of the procedure and the sedation                            options and risks were discussed with the patient.                            All questions were answered, and informed consent                            was obtained. Prior Anticoagulants: The patient has                            taken no anticoagulant or antiplatelet agents. ASA                            Grade Assessment: III - A patient with severe                            systemic disease. After reviewing the risks and                            benefits, the patient was deemed in satisfactory  condition to undergo the procedure.                           After obtaining informed consent, the colonoscope                            was passed under direct vision. Throughout the                            procedure, the patient's blood pressure, pulse, and                            oxygen saturations were monitored continuously. The                            Olympus Scope  [id number] was introduced through the                            anus with the intention of advancing to the cecum.                            The scope was advanced to the descending colon                            before the procedure was aborted. Medications were                            given. The colonoscopy was performed without                            difficulty. The patient tolerated the procedure                            well. The quality of the bowel preparation was poor. Scope In: 3:03:13 PM Scope Out: 3:04:24 PM Total Procedure Duration: 0 hours 1 minute 11 seconds  Findings:                 The perianal and digital rectal examinations were                            normal.                           A large amount of semi-solid stool was found in the                            rectum, in the recto-sigmoid colon, in the sigmoid                            colon and in the descending colon, precluding                            visualization. It was not possible to lavage the  colon. The procedure was aborted in the descending                            colon. Complications:            No immediate complications. Estimated blood loss:                            None. Estimated Blood Loss:     Estimated blood loss: none. Impression:               - Preparation of the colon was poor.                           - Stool in the rectum, in the recto-sigmoid colon,                            in the sigmoid colon and in the descending colon. Recommendation:           - Patient has a contact number available for                            emergencies. The signs and symptoms of potential                            delayed complications were discussed with the                            patient. Return to normal activities tomorrow.                            Written discharge instructions were provided to the                            patient.                            - Resume previous diet.                           - Continue present medications.                           - Repeat colonoscopy because the bowel preparation                            was suboptimal. If patient is willing, could stay                            on clear liquids today, and do another prep this                            evening with procedure tomorrow PM. If not, will                            reschedule for another day using a  2 day prep. Of                            note, she is on Ozempic  which could have altered                            her motility. Elspeth P. Chosen Garron, MD 01/12/2024 3:09:49 PM This report has been signed electronically.

## 2024-01-13 ENCOUNTER — Telehealth: Payer: Self-pay | Admitting: *Deleted

## 2024-01-13 NOTE — Telephone Encounter (Signed)
" °  Follow up Call-     01/12/2024    2:24 PM  Call back number  Post procedure Call Back phone  # [phone]  Permission to leave phone message Yes    Left message to call back if any questions or concerns "

## 2024-01-30 ENCOUNTER — Emergency Department (HOSPITAL_COMMUNITY)

## 2024-01-30 ENCOUNTER — Emergency Department (HOSPITAL_COMMUNITY)
Admission: EM | Admit: 2024-01-30 | Discharge: 2024-01-30 | Disposition: A | Discharge status: Home / Self Care | Attending: Emergency Medicine | Admitting: Emergency Medicine

## 2024-01-30 ENCOUNTER — Encounter (HOSPITAL_COMMUNITY): Payer: Self-pay

## 2024-01-30 ENCOUNTER — Other Ambulatory Visit: Payer: Self-pay

## 2024-01-30 DIAGNOSIS — R42 Dizziness and giddiness: Secondary | ICD-10-CM | POA: Diagnosis present

## 2024-01-30 DIAGNOSIS — Z79890 Hormone replacement therapy (postmenopausal): Secondary | ICD-10-CM | POA: Insufficient documentation

## 2024-01-30 DIAGNOSIS — Z91040 Latex allergy status: Secondary | ICD-10-CM | POA: Diagnosis not present

## 2024-01-30 DIAGNOSIS — E119 Type 2 diabetes mellitus without complications: Secondary | ICD-10-CM | POA: Diagnosis not present

## 2024-01-30 DIAGNOSIS — I2510 Atherosclerotic heart disease of native coronary artery without angina pectoris: Secondary | ICD-10-CM | POA: Diagnosis not present

## 2024-01-30 DIAGNOSIS — R109 Unspecified abdominal pain: Secondary | ICD-10-CM | POA: Insufficient documentation

## 2024-01-30 DIAGNOSIS — Z85038 Personal history of other malignant neoplasm of large intestine: Secondary | ICD-10-CM | POA: Diagnosis not present

## 2024-01-30 DIAGNOSIS — E039 Hypothyroidism, unspecified: Secondary | ICD-10-CM | POA: Insufficient documentation

## 2024-01-30 LAB — URINALYSIS, ROUTINE W REFLEX MICROSCOPIC
Bilirubin Urine: NEGATIVE
Glucose, UA: NEGATIVE mg/dL
Hgb urine dipstick: NEGATIVE
Ketones, ur: 5 mg/dL — AB
Leukocytes,Ua: NEGATIVE
Nitrite: NEGATIVE
Protein, ur: 30 mg/dL — AB
Specific Gravity, Urine: 1.011 (ref 1.005–1.030)
pH: 6 (ref 5.0–8.0)

## 2024-01-30 LAB — COMPREHENSIVE METABOLIC PANEL WITH GFR
ALT: 5 U/L (ref 0–44)
AST: 19 U/L (ref 15–41)
Albumin: 4.4 g/dL (ref 3.5–5.0)
Alkaline Phosphatase: 67 U/L (ref 38–126)
Anion gap: 13 (ref 5–15)
BUN: 26 mg/dL — ABNORMAL HIGH (ref 8–23)
CO2: 25 mmol/L (ref 22–32)
Calcium: 9.5 mg/dL (ref 8.9–10.3)
Chloride: 101 mmol/L (ref 98–111)
Creatinine, Ser: 1.12 mg/dL — ABNORMAL HIGH (ref 0.44–1.00)
GFR, Estimated: 49 mL/min — ABNORMAL LOW
Glucose, Bld: 158 mg/dL — ABNORMAL HIGH (ref 70–99)
Potassium: 3.8 mmol/L (ref 3.5–5.1)
Sodium: 138 mmol/L (ref 135–145)
Total Bilirubin: 0.5 mg/dL (ref 0.0–1.2)
Total Protein: 7.3 g/dL (ref 6.5–8.1)

## 2024-01-30 LAB — I-STAT CHEM 8, ED
BUN: 26 mg/dL — ABNORMAL HIGH (ref 8–23)
Calcium, Ion: 1.22 mmol/L (ref 1.15–1.40)
Chloride: 101 mmol/L (ref 98–111)
Creatinine, Ser: 1.1 mg/dL — ABNORMAL HIGH (ref 0.44–1.00)
Glucose, Bld: 156 mg/dL — ABNORMAL HIGH (ref 70–99)
HCT: 36 % (ref 36.0–46.0)
Hemoglobin: 12.2 g/dL (ref 12.0–15.0)
Potassium: 3.7 mmol/L (ref 3.5–5.1)
Sodium: 140 mmol/L (ref 135–145)
TCO2: 24 mmol/L (ref 22–32)

## 2024-01-30 LAB — LIPASE, BLOOD: Lipase: 33 U/L (ref 11–51)

## 2024-01-30 LAB — CBC
HCT: 36.3 % (ref 36.0–46.0)
Hemoglobin: 12.3 g/dL (ref 12.0–15.0)
MCH: 32.6 pg (ref 26.0–34.0)
MCHC: 33.9 g/dL (ref 30.0–36.0)
MCV: 96.3 fL (ref 80.0–100.0)
Platelets: 117 K/uL — ABNORMAL LOW (ref 150–400)
RBC: 3.77 MIL/uL — ABNORMAL LOW (ref 3.87–5.11)
RDW: 11.9 % (ref 11.5–15.5)
WBC: 6 K/uL (ref 4.0–10.5)
nRBC: 0.3 % — ABNORMAL HIGH (ref 0.0–0.2)

## 2024-01-30 MED ORDER — PROCHLORPERAZINE EDISYLATE 10 MG/2ML IJ SOLN
10.0000 mg | Freq: Once | INTRAMUSCULAR | Status: AC
  Administered 2024-01-30: 10 mg via INTRAVENOUS
  Filled 2024-01-30: qty 2

## 2024-01-30 MED ORDER — DIPHENHYDRAMINE HCL 50 MG/ML IJ SOLN
50.0000 mg | Freq: Once | INTRAMUSCULAR | Status: AC
  Administered 2024-01-30: 50 mg via INTRAVENOUS
  Filled 2024-01-30: qty 1

## 2024-01-30 MED ORDER — ONDANSETRON 4 MG PO TBDP: 4.0000 mg | ORAL_TABLET | Freq: Three times a day (TID) | ORAL | 0 refills | Status: AC | PRN

## 2024-01-30 NOTE — ED Triage Notes (Signed)
 Pt BIB [facility] from home d/t dizziness and vomiting.  Hx of vertigo but feels worse.  NS  on EKG BP 159/66 HR 64 CBG 178 RR 18 O2 98%  EMS gave 4 mg IM Zofran  No IV

## 2024-01-30 NOTE — Discharge Instructions (Addendum)
 It was a pleasure taking care of you today. You were seen in the Emergency Department for evaluation of dizziness and vomiting. Your work-up was reassuring. Your CT/Xray/Labs showed no acute abnormality or infection to explain your symptoms. Refer to the attached documentation for further management of your symptoms. Follow up with your PCP if your symptoms continue.  Please return to the ER if you experience chest pain, trouble breathing, intractable nausea/vomiting or any other life threatening illnesses.

## 2024-01-30 NOTE — ED Provider Notes (Signed)
 " [place] [facility] Provider Note   CSN: [id number] Arrival date & time: 01/30/24  9445     Patient presents with: Dizziness   Lisa Osborne is a 84 y.o. female with past medical history of colon cancer, diabetes, GERD, hypothyroidism, chronic vertigo, CAD, who presents emergency department for evaluation of dizziness and vomiting.  Patient reports that she woke up around 4:45 AM to use the bathroom and described the whole world was spinning.  She reports multiple episodes of yellow/bile emesis.  Patient states this does feel different than her normal vertigo.  She states she felt like she was unable to move due to the dizziness.  She did receive 4 mg of Zofran  with EMS, and states that this time her dizziness does feel like it has improved but she still feels like she has nauseous.  She denies any diarrhea or constipation.  She denies any recent sick contacts.  Patient is endorsing right-sided abdominal pain.   Dizziness      Prior to Admission medications  Medication Sig Start Date End Date Taking? Authorizing Provider  ondansetron  (ZOFRAN -ODT) 4 MG disintegrating tablet Take 1 tablet (4 mg total) by mouth every 8 (eight) hours as needed for nausea or vomiting. 01/30/24  Yes Nikia Levels, Marry RAMAN, PA-C  atorvastatin  (LIPITOR) 10 MG tablet Take 1 tablet (10 mg total) by mouth daily. 04/01/23   Rilla Baller, MD  Calcium  Citrate-Vitamin D  (CALCIUM  CITRATE + D PO) Take 1 tablet by mouth daily.     [provider]  Cholecalciferol  (VITAMIN D3) 25 MCG (1000 UT) CAPS Take 1 capsule (1,000 Units total) by mouth daily. 04/01/23   Rilla Baller, MD  Coenzyme Q10 (COQ10) 200 MG CAPS Take 1 capsule by mouth daily.     [provider]  Ferrous Sulfate (IRON) 325 (65 Fe) MG TABS Take by mouth daily.    [provider]  lansoprazole  (PREVACID ) 15 MG capsule Take 1 capsule (15 mg total) by mouth daily. 04/01/23   Rilla Baller,  MD  levothyroxine  (SYNTHROID ) 25 MCG tablet Take 1 tablet (25 mcg total) by mouth daily. 04/01/23   Rilla Baller, MD  lisinopril  (ZESTRIL ) 5 MG tablet Take 1 tablet (5 mg total) by mouth daily. 04/01/23   Rilla Baller, MD  Melatonin 5 MG TABS Take 5 mg by mouth at bedtime.     [provider]  Omega-3 Fatty Acids (FISH OIL) 1000 MG CAPS Take 1 capsule by mouth daily.    [provider]  ondansetron  (ZOFRAN ) 4 MG tablet Take 1 tablet (4 mg total) by mouth every 8 (eight) hours as needed for nausea or vomiting. 01/12/24   Armbruster, Elspeth SQUIBB, MD  Semaglutide ,0.25 or 0.5MG /DOS, (OZEMPIC , 0.25 OR 0.5 MG/DOSE,) 2 MG/3ML SOPN Inject 0.5 mg as directed once a week. 04/01/23   Rilla Baller, MD  sertraline  (ZOLOFT ) 100 MG tablet Take 1 tablet (100 mg total) by mouth daily. 04/01/23   Rilla Baller, MD  vitamin B-12 (CYANOCOBALAMIN ) 1000 MCG tablet Take 1,000 mcg by mouth daily.    [provider]    Allergies: Latex and Nickel    Review of Systems  Neurological:  Positive for dizziness.    Updated Vital Signs BP (!) 158/70 (BP Location: Left Arm)   Pulse 60   Temp (!) 97 F (36.1 C) (Temporal)   Resp 14   Ht 5' 6 (1.676 m)   Wt 83 kg   SpO2 100%   BMI 29.54  kg/m   Physical Exam Vitals and nursing note reviewed.  Constitutional:      Appearance: Normal appearance.  HENT:     Head: Normocephalic and atraumatic.     Mouth/Throat:     Mouth: Mucous membranes are moist.  Eyes:     General: No scleral icterus.       Right eye: No discharge.        Left eye: No discharge.     Conjunctiva/sclera: Conjunctivae normal.  Cardiovascular:     Rate and Rhythm: Normal rate and regular rhythm.     Pulses: Normal pulses.  Pulmonary:     Effort: Pulmonary effort is normal.     Breath sounds: Normal breath sounds.  Abdominal:     General: There is no distension.     Tenderness: There is abdominal tenderness.     Comments: Right abdominal tenderness  noted.  Musculoskeletal:        General: No deformity.     Cervical back: Normal range of motion.  Skin:    General: Skin is warm and dry.     Capillary Refill: Capillary refill takes less than 2 seconds.  Neurological:     Mental Status: She is alert.     Motor: No weakness.     Comments: Patient speaks in full goal oriented sentences. Cranial nerves 3-12 grossly intact. DTRs normal and symmetric. Equal grip strength bilateral with 5/5 strength against resistance in upper and lower extremities. No sensory or motor deficits appreciated.   Psychiatric:        Mood and Affect: Mood normal.     (all labs ordered are listed, but only abnormal results are displayed) Labs Reviewed  COMPREHENSIVE METABOLIC PANEL WITH GFR - Abnormal; Notable for the following components:      Result Value   Glucose, Bld 158 (*)    BUN 26 (*)    Creatinine, Ser 1.12 (*)    GFR, Estimated 49 (*)    All other components within normal limits  CBC - Abnormal; Notable for the following components:   RBC 3.77 (*)    Platelets 117 (*)    nRBC 0.3 (*)    All other components within normal limits  URINALYSIS, ROUTINE W REFLEX MICROSCOPIC - Abnormal; Notable for the following components:   APPearance HAZY (*)    Ketones, ur 5 (*)    Protein, ur 30 (*)    Bacteria, UA RARE (*)    All other components within normal limits  I-STAT CHEM 8, ED - Abnormal; Notable for the following components:   BUN 26 (*)    Creatinine, Ser 1.10 (*)    Glucose, Bld 156 (*)    All other components within normal limits  LIPASE, BLOOD    EKG: EKG Interpretation Date/Time:  Monday January 30 2024 06:04:28 EST Ventricular Rate:  58 PR Interval:  184 QRS Duration:  112 QT Interval:  462 QTC Calculation: 454 R Axis:   2  Text Interpretation: Sinus rhythm Incomplete right bundle branch block Confirmed by Bari Pfeiffer ([id number]) on 01/30/2024 6:09:54 AM  Radiology: CT Head Wo Contrast Result Date: 01/30/2024 CLINICAL DATA:   Vertigo.  Dizziness. EXAM: CT HEAD WITHOUT CONTRAST TECHNIQUE: Contiguous axial images were obtained from the base of the skull through the vertex without intravenous contrast. RADIATION DOSE REDUCTION: This exam was performed according to the departmental dose-optimization program which includes automated exposure control, adjustment of the mA and/or kV according to patient size and/or use of iterative reconstruction  technique. COMPARISON:  No comparison studies available. FINDINGS: Brain: There is no evidence for acute hemorrhage, hydrocephalus, mass lesion, or abnormal extra-axial fluid collection. No definite CT evidence for acute infarction. Patchy low attenuation in the deep hemispheric and periventricular white matter is nonspecific, but likely reflects chronic microvascular ischemic demyelination. Vascular: No hyperdense vessel or unexpected calcification. Skull: No evidence for fracture. No worrisome lytic or sclerotic lesion. Sinuses/Orbits: Left frontal sinus osteoma evident with associated mucosal disease. Remaining visualized paranasal sinuses and mastoid air cells are clear. Visualized portions of the globes and intraorbital fat are unremarkable. Other: None. IMPRESSION: 1. No acute intracranial abnormality. 2. Chronic small vessel ischemic disease. 3. 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 Electronically Signed   By: Camellia Candle M.D.   On: 01/30/2024 07:37     Procedures   Medications Ordered in the ED  prochlorperazine  (COMPAZINE ) injection 10 mg (10 mg Intravenous Given 01/30/24 0655)  diphenhydrAMINE  (BENADRYL ) injection 50 mg (50 mg Intravenous Given 01/30/24 0655)                                 Medical Decision Making Amount and/or Complexity of Data Reviewed Labs: ordered. Radiology: ordered.  Risk Prescription drug management.   This patient presents to the ED for concern of dizziness and vomiting, this involves an extensive number of treatment options,  and is a complaint that carries with it a high risk of complications and morbidity.   Differential diagnosis includes: Stroke, TIA, vertigo, AKI, UTI, BPPV, migraine, ACS  Co morbidities:  history of chronic vertigo   Lab Tests:  I Ordered, and personally interpreted labs.  The pertinent results include: No acute abnormalities  Imaging Studies:  I ordered imaging studies including CT head I independently visualized and interpreted imaging which showed no acute intracranial abnormality to explain patient's symptoms I agree with the radiologist interpretation  Cardiac Monitoring/ECG:  The patient was maintained on a cardiac monitor.  I personally viewed and interpreted the cardiac monitored which showed an underlying rhythm of: Sinus rhythm  Medicines ordered and prescription drug management:  I ordered medication including  Medications  prochlorperazine  (COMPAZINE ) injection 10 mg (10 mg Intravenous Given 01/30/24 0655)  diphenhydrAMINE  (BENADRYL ) injection 50 mg (50 mg Intravenous Given 01/30/24 0655)   for dizziness and nausea Reevaluation of the patient after these medicines showed that the patient improved I have reviewed the patients home medicines and have made adjustments as needed  Test Considered:  none  Critical Interventions:  none  Consultations Obtained: none  Problem List / ED Course:     ICD-10-CM   1. Dizziness  R42       MDM: 84 year old female who presents emergency department evaluation of dizziness and vomiting.  Initial lab work is unremarkable.  No AKI.  UA is unremarkable.  I did treat the patient's symptoms with Compazine  and Benadryl .  CT is remarkable. Upon reassessment, patient is resting comfortably and sleeping.   I do suspect patient symptoms were likely secondary to peripheral vertigo as her symptoms did improve with Compazine  and Benadryl .  I have sent a prescription for Zofran  to patient's pharmacy for recurring nausea.  I recommended  patient follow-up with her PCP if her symptoms persist.  Patient verbalized understanding to this.  Patient's vital signs are stable.  Patient is appropriate for discharge at this time.   Dispostion:  After consideration of the diagnostic results and the patients response  to treatment, I feel that the patient would benefit from supportive care.    Final diagnoses:  Dizziness    ED Discharge Orders          Ordered    ondansetron  (ZOFRAN -ODT) 4 MG disintegrating tablet  Every 8 hours PRN        01/30/24 0748               Torrence Marry RAMAN, PA-C 01/30/24 9250    Bari Charmaine FALCON, MD 01/31/24 [phone]  "

## 2024-01-31 ENCOUNTER — Encounter: Payer: Self-pay | Admitting: Gastroenterology

## 2024-01-31 ENCOUNTER — Telehealth: Payer: Self-pay | Admitting: Gastroenterology

## 2024-01-31 NOTE — Telephone Encounter (Signed)
 Okay got it, thanks for letting me know

## 2024-01-31 NOTE — Telephone Encounter (Signed)
 Good afternoon Dr. Leigh,   Patient called stating that she needed to cancel her procedure with you on 1/9 at 2:00 because she just got out of the hospital and I still very weak.    Patient was rescheduled for  1/21 at 12:30

## 2024-02-03 ENCOUNTER — Encounter: Admitting: Gastroenterology

## 2024-02-09 ENCOUNTER — Other Ambulatory Visit

## 2024-02-13 ENCOUNTER — Encounter: Payer: Self-pay | Admitting: Family Medicine

## 2024-02-13 ENCOUNTER — Ambulatory Visit: Admitting: Family Medicine

## 2024-02-13 VITALS — BP 110/62 | HR 63 | Temp 97.8°F | Ht 66.0 in | Wt 178.0 lb

## 2024-02-13 DIAGNOSIS — R2681 Unsteadiness on feet: Secondary | ICD-10-CM

## 2024-02-13 DIAGNOSIS — N1831 Chronic kidney disease, stage 3a: Secondary | ICD-10-CM

## 2024-02-13 DIAGNOSIS — K219 Gastro-esophageal reflux disease without esophagitis: Secondary | ICD-10-CM

## 2024-02-13 DIAGNOSIS — E039 Hypothyroidism, unspecified: Secondary | ICD-10-CM

## 2024-02-13 DIAGNOSIS — D164 Benign neoplasm of bones of skull and face: Secondary | ICD-10-CM | POA: Diagnosis not present

## 2024-02-13 DIAGNOSIS — E1169 Type 2 diabetes mellitus with other specified complication: Secondary | ICD-10-CM

## 2024-02-13 DIAGNOSIS — H9192 Unspecified hearing loss, left ear: Secondary | ICD-10-CM | POA: Diagnosis not present

## 2024-02-13 DIAGNOSIS — Z7984 Long term (current) use of oral hypoglycemic drugs: Secondary | ICD-10-CM

## 2024-02-13 DIAGNOSIS — D696 Thrombocytopenia, unspecified: Secondary | ICD-10-CM | POA: Diagnosis not present

## 2024-02-13 DIAGNOSIS — D72821 Monocytosis (symptomatic): Secondary | ICD-10-CM | POA: Diagnosis not present

## 2024-02-13 DIAGNOSIS — R42 Dizziness and giddiness: Secondary | ICD-10-CM | POA: Diagnosis not present

## 2024-02-13 LAB — CBC WITH DIFFERENTIAL/PLATELET
Basophils Absolute: 0 K/uL (ref 0.0–0.1)
Basophils Relative: 0.4 % (ref 0.0–3.0)
Eosinophils Absolute: 0 K/uL (ref 0.0–0.7)
Eosinophils Relative: 0.1 % (ref 0.0–5.0)
HCT: 35.1 % — ABNORMAL LOW (ref 36.0–46.0)
Hemoglobin: 12.1 g/dL (ref 12.0–15.0)
Lymphocytes Relative: 43.4 % (ref 12.0–46.0)
Lymphs Abs: 2.4 K/uL (ref 0.7–4.0)
MCHC: 34.4 g/dL (ref 30.0–36.0)
MCV: 95.4 fl (ref 78.0–100.0)
Monocytes Absolute: 0.9 K/uL (ref 0.1–1.0)
Monocytes Relative: 16.8 % — ABNORMAL HIGH (ref 3.0–12.0)
Neutro Abs: 2.2 K/uL (ref 1.4–7.7)
Neutrophils Relative %: 39.3 % — ABNORMAL LOW (ref 43.0–77.0)
Platelets: 132 K/uL — ABNORMAL LOW (ref 150.0–400.0)
RBC: 3.68 Mil/uL — ABNORMAL LOW (ref 3.87–5.11)
RDW: 12.6 % (ref 11.5–15.5)
WBC: 5.6 K/uL (ref 4.0–10.5)

## 2024-02-13 LAB — TSH: TSH: 2.28 u[IU]/mL (ref 0.35–5.50)

## 2024-02-13 LAB — RENAL FUNCTION PANEL
Albumin: 4.5 g/dL (ref 3.5–5.2)
BUN: 37 mg/dL — ABNORMAL HIGH (ref 6–23)
CO2: 29 meq/L (ref 19–32)
Calcium: 9.6 mg/dL (ref 8.4–10.5)
Chloride: 101 meq/L (ref 96–112)
Creatinine, Ser: 1.18 mg/dL (ref 0.40–1.20)
GFR: 43.14 mL/min — ABNORMAL LOW
Glucose, Bld: 80 mg/dL (ref 70–99)
Phosphorus: 4.2 mg/dL (ref 2.3–4.6)
Potassium: 3.8 meq/L (ref 3.5–5.1)
Sodium: 141 meq/L (ref 135–145)

## 2024-02-13 NOTE — Assessment & Plan Note (Signed)
 With recurrent falls.  Now using cane regularly Declines outpatient PT referral for balance training/fall prevention.  Last referred to vestibular rehab 03/2023.

## 2024-02-13 NOTE — Assessment & Plan Note (Addendum)
 Chronic issue, severe episode earlier this month, presumed BPPV related.  ER records reviewed.  Will refer back to ENT (last seen 11/2021).  Previous brain MRI 04/2022 reassuring.

## 2024-02-13 NOTE — Patient Instructions (Addendum)
 Labs today  Stop ozempic   Stop lisinopril . Try prevacid  every other day to see if reflux remains well controlled.  I will refer you to ENT for left frontal sinus osteoma evaluation as well as vertigo evaluation.  Return for previously scheduled physical

## 2024-02-13 NOTE — Progress Notes (Signed)
 " Ph: [phone] Fax: [phone]   Patient ID: Rudell Kayla Lefort, female    DOB: 11-Sep-1940, 84 y.o.   MRN: [id number]  This visit was conducted in person.  BP 110/62 (BP Location: Right Arm, Cuff Size: Normal)   Pulse 63   Temp 97.8 F (36.6 C) (Oral)   Ht 5' 6 (1.676 m)   Wt 178 lb (80.7 kg)   SpO2 99%   BMI 28.73 kg/m   BP Readings from Last 3 Encounters:  02/13/24 110/62  01/30/24 136/65  01/12/24 (!) 122/55   CC: ER f/u visit  Subjective:   HPI: Zayden Hahne is a 84 y.o. female presenting on 02/13/2024 for Hospitalization Follow-up (ED FU/Seen 01/30/2024 for extreme dizziness and vomiting /Hx of chronic vertigo//Pt states she still has vertigo but nothing like it was, she states it had been so bad that he could not stand up//Wants to discuss tests that were ran in [facility] and would like to discuss med list)   24 lb weight loss over the past year, total ~50 lbs since starting ozempic  10/2022, currently on 0.5mg  weekly dose.  Lab Results  Component Value Date   HGBA1C 5.6 03/25/2023   Pending colonoscopy 02/15/2024 ([facility]) in h/o colon cancer 2014.   Has seen hematology Dr Autumn for chronic monocytosis - released from care on 06/2023 with plan to have PCP f/u.   Recent ER visit for severe room spinning vertigo and vomiting that lasted 3 hours. EMS had to be called to get her out of house. Received reassuring head CT - chronic small vessel ischemic disease and left frontal sinus osteoma with associated mucosal disease. She notes occasional left frontal headache.  Treated with compazine  and benadryl  with benefit.  Diagnosed with benign positional vertigo. Discharged on zofran  PRN nausea. ER records reviewed. Med rec performed.   Labs showed new thrombocytopenia plt 114, Cr 1.12, GFR 49, glu 158, LFTs WNL.   Brain MRI 04/2022 - no acute process, no cause of hearing loss identified, mod chronic small vessel ischemic changes, paranasal sinus disease.    Has had 4 falls this past year - ankles giving out, and tripping over stone. This is despite regularly using cane.    Relevant past medical, surgical, family and social history reviewed and updated as indicated. Interim medical history since our last visit reviewed. Allergies and medications reviewed and updated. Outpatient Medications Prior to Visit  Medication Sig Dispense Refill   atorvastatin  (LIPITOR) 10 MG tablet Take 1 tablet (10 mg total) by mouth daily. 90 tablet 4   Calcium  Citrate-Vitamin D  (CALCIUM  CITRATE + D PO) Take 1 tablet by mouth daily.      Cholecalciferol  (VITAMIN D3) 25 MCG (1000 UT) CAPS Take 1 capsule (1,000 Units total) by mouth daily.     Coenzyme Q10 (COQ10) 200 MG CAPS Take 1 capsule by mouth daily.      Ferrous Sulfate (IRON) 325 (65 Fe) MG TABS Take by mouth daily.     lansoprazole  (PREVACID ) 15 MG capsule Take 1 capsule (15 mg total) by mouth daily. 90 capsule 4   levothyroxine  (SYNTHROID ) 25 MCG tablet Take 1 tablet (25 mcg total) by mouth daily. 90 tablet 4   Melatonin 5 MG TABS Take 5 mg by mouth at bedtime.      Na Sulfate-K Sulfate-Mg Sulfate concentrate (SUPREP) 17.5-3.13-1.6 GM/177ML SOLN SMARTSIG:Once (Patient taking differently: Will start taking 02/16/2024)     Omega-3 Fatty Acids (FISH OIL) 1000 MG CAPS Take 1 capsule by  mouth daily.     ondansetron  (ZOFRAN -ODT) 4 MG disintegrating tablet Take 1 tablet (4 mg total) by mouth every 8 (eight) hours as needed for nausea or vomiting. 20 tablet 0   sertraline  (ZOLOFT ) 100 MG tablet Take 1 tablet (100 mg total) by mouth daily. 90 tablet 4   vitamin B-12 (CYANOCOBALAMIN ) 1000 MCG tablet Take 1,000 mcg by mouth daily.     lisinopril  (ZESTRIL ) 5 MG tablet Take 1 tablet (5 mg total) by mouth daily. 90 tablet 3   ondansetron  (ZOFRAN ) 4 MG tablet Take 1 tablet (4 mg total) by mouth every 8 (eight) hours as needed for nausea or vomiting. 5 tablet 0   Semaglutide ,0.25 or 0.5MG /DOS, (OZEMPIC , 0.25 OR 0.5 MG/DOSE,)  2 MG/3ML SOPN Inject 0.5 mg as directed once a week. 6 mL 3   No facility-administered medications prior to visit.     Per HPI unless specifically indicated in ROS section below Review of Systems  Objective:  BP 110/62 (BP Location: Right Arm, Cuff Size: Normal)   Pulse 63   Temp 97.8 F (36.6 C) (Oral)   Ht 5' 6 (1.676 m)   Wt 178 lb (80.7 kg)   SpO2 99%   BMI 28.73 kg/m   Wt Readings from Last 3 Encounters:  02/13/24 178 lb (80.7 kg)  01/30/24 183 lb (83 kg)  01/12/24 183 lb (83 kg)      Physical Exam Vitals and nursing note reviewed.  Constitutional:      Appearance: Normal appearance. She is not ill-appearing.  HENT:     Head: Normocephalic and atraumatic.     Right Ear: Tympanic membrane, ear canal and external ear normal. There is no impacted cerumen.     Left Ear: Tympanic membrane, ear canal and external ear normal. There is no impacted cerumen.     Mouth/Throat:     Comments: Wearing mask Eyes:     Extraocular Movements: Extraocular movements intact.     Conjunctiva/sclera: Conjunctivae normal.     Pupils: Pupils are equal, round, and reactive to light.  Neck:     Thyroid : No thyroid  mass or thyromegaly.  Cardiovascular:     Rate and Rhythm: Normal rate and regular rhythm.     Pulses: Normal pulses.     Heart sounds: Normal heart sounds. No murmur heard. Pulmonary:     Effort: Pulmonary effort is normal. No respiratory distress.     Breath sounds: Normal breath sounds. No wheezing, rhonchi or rales.  Musculoskeletal:     Cervical back: Normal range of motion and neck supple.     Right lower leg: No edema.     Left lower leg: No edema.  Skin:    General: Skin is warm and dry.     Findings: No rash.  Neurological:     Mental Status: She is alert.       Results for orders placed or performed during the hospital encounter of 01/30/24  Urinalysis, Routine w reflex microscopic -Urine, Clean Catch   Collection Time: 01/30/24  6:10 AM  Result Value Ref  Range   Color, Urine YELLOW YELLOW   APPearance HAZY (A) CLEAR   Specific Gravity, Urine 1.011 1.005 - 1.030   pH 6.0 5.0 - 8.0   Glucose, UA NEGATIVE NEGATIVE mg/dL   Hgb urine dipstick NEGATIVE NEGATIVE   Bilirubin Urine NEGATIVE NEGATIVE   Ketones, ur 5 (A) NEGATIVE mg/dL   Protein, ur 30 (A) NEGATIVE mg/dL   Nitrite NEGATIVE NEGATIVE   Leukocytes,Ua  NEGATIVE NEGATIVE   RBC / HPF 0-5 0 - 5 RBC/hpf   WBC, UA 0-5 0 - 5 WBC/hpf   Bacteria, UA RARE (A) NONE SEEN   Squamous Epithelial / HPF 6-10 0 - 5 /HPF  Lipase, blood   Collection Time: 01/30/24  6:21 AM  Result Value Ref Range   Lipase 33 11 - 51 U/L  Comprehensive metabolic panel   Collection Time: 01/30/24  6:21 AM  Result Value Ref Range   Sodium 138 135 - 145 mmol/L   Potassium 3.8 3.5 - 5.1 mmol/L   Chloride 101 98 - 111 mmol/L   CO2 25 22 - 32 mmol/L   Glucose, Bld 158 (H) 70 - 99 mg/dL   BUN 26 (H) 8 - 23 mg/dL   Creatinine, Ser 8.87 (H) 0.44 - 1.00 mg/dL   Calcium  9.5 8.9 - 10.3 mg/dL   Total Protein 7.3 6.5 - 8.1 g/dL   Albumin 4.4 3.5 - 5.0 g/dL   AST 19 15 - 41 U/L   ALT 5 0 - 44 U/L   Alkaline Phosphatase 67 38 - 126 U/L   Total Bilirubin 0.5 0.0 - 1.2 mg/dL   GFR, Estimated 49 (L) >60 mL/min   Anion gap 13 5 - 15  CBC   Collection Time: 01/30/24  6:21 AM  Result Value Ref Range   WBC 6.0 4.0 - 10.5 K/uL   RBC 3.77 (L) 3.87 - 5.11 MIL/uL   Hemoglobin 12.3 12.0 - 15.0 g/dL   HCT 63.6 63.9 - 53.9 %   MCV 96.3 80.0 - 100.0 fL   MCH 32.6 26.0 - 34.0 pg   MCHC 33.9 30.0 - 36.0 g/dL   RDW 88.0 88.4 - 84.4 %   Platelets 117 (L) 150 - 400 K/uL   nRBC 0.3 (H) 0.0 - 0.2 %  I-stat chem 8, ED (not at [facility], [facility] or [facility])   Collection Time: 01/30/24  6:24 AM  Result Value Ref Range   Sodium 140 135 - 145 mmol/L   Potassium 3.7 3.5 - 5.1 mmol/L   Chloride 101 98 - 111 mmol/L   BUN 26 (H) 8 - 23 mg/dL   Creatinine, Ser 8.89 (H) 0.44 - 1.00 mg/dL   Glucose, Bld 843 (H) 70 - 99 mg/dL   Calcium , Ion 1.22 1.15 -  1.40 mmol/L   TCO2 24 22 - 32 mmol/L   Hemoglobin 12.2 12.0 - 15.0 g/dL   HCT 63.9 63.9 - 53.9 %   Lab Results  Component Value Date   TSH 2.421 04/19/2023   Assessment & Plan:   Problem List Items Addressed This Visit     Type 2 diabetes mellitus with other specified complication (HCC)   Chronic, anticipate great control with marked weight loss. Will go ahead and ozempic  at this time, low threshold to restart if significant weight gain noted.       GERD (gastroesophageal reflux disease)   Chronic, overall stable period on prevacid  15mg  daily. Suspect improved  control with weight loss  - rec trial drop PPI to every other day dosing      Relevant Medications   Na Sulfate-K Sulfate-Mg Sulfate concentrate (SUPREP) 17.5-3.13-1.6 GM/177ML SOLN   Hypothyroidism   Update thyroid  levels with weight loss over the past year.  She continues levothyroxine  25mcg daily.       Acquired deafness of left ear   Relevant Orders   Ambulatory referral to ENT   Chronic vertigo - Primary   Chronic issue,  severe episode earlier this month, presumed BPPV related.  ER records reviewed.  Will refer back to ENT (last seen 11/2021).  Previous brain MRI 04/2022 reassuring.       Relevant Orders   TSH   Ambulatory referral to ENT   CKD (chronic kidney disease) stage 3, GFR 30-59 ml/min (HCC)   Chronic, GFR remaining 50s, latest 49.  With BP trending down and weight loss, will stop lisinopril  5mg  at this time.  Will also recommend tapering PPI dose to every other day       Relevant Orders   TSH   CBC with Differential/Platelet   Renal function panel   General unsteadiness   With recurrent falls.  Now using cane regularly Declines outpatient PT referral for balance training/fall prevention.  Last referred to vestibular rehab 03/2023.       Monocytosis   Saw heme, rec PCP continue monitoring monocytosis.       Osteoma of paranasal sinus   Incidental finding. Pt endorses intermittent  mild L frontal headaches.  Will refer to ENT for further eval       Relevant Orders   Ambulatory referral to ENT   Thrombocytopenia   Noted at ER visit, of unclear cause. Update CBC today.         No orders of the defined types were placed in this encounter.   Orders Placed This Encounter  Procedures   TSH   CBC with Differential/Platelet   Renal function panel   Ambulatory referral to ENT    Referral Priority:   Routine    Referral Type:   Consultation    Referral Reason:   Specialty Services Required    Requested Specialty:   Otolaryngology    Number of Visits Requested:   1    Patient Instructions  Labs today  Stop ozempic   Stop lisinopril . Try prevacid  every other day to see if reflux remains well controlled.  I will refer you to ENT for left frontal sinus osteoma evaluation as well as vertigo evaluation.  Return for previously scheduled physical  Follow up plan: Return if symptoms worsen or fail to improve.  Anton Blas, MD   "

## 2024-02-13 NOTE — Assessment & Plan Note (Signed)
 Noted at ER visit, of unclear cause. Update CBC today.

## 2024-02-13 NOTE — Assessment & Plan Note (Signed)
 Chronic, anticipate great control with marked weight loss. Will go ahead and ozempic  at this time, low threshold to restart if significant weight gain noted.

## 2024-02-13 NOTE — Assessment & Plan Note (Signed)
 Saw heme, rec PCP continue monitoring monocytosis.

## 2024-02-13 NOTE — Assessment & Plan Note (Addendum)
 Chronic, GFR remaining 50s, latest 49.  With BP trending down and weight loss, will stop lisinopril  5mg  at this time.  Will also recommend tapering PPI dose to every other day

## 2024-02-13 NOTE — Assessment & Plan Note (Signed)
 Update thyroid  levels with weight loss over the past year.  She continues levothyroxine  25mcg daily.

## 2024-02-13 NOTE — Assessment & Plan Note (Signed)
 Chronic, overall stable period on prevacid  15mg  daily. Suspect improved  control with weight loss  - rec trial drop PPI to every other day dosing

## 2024-02-13 NOTE — Assessment & Plan Note (Signed)
 Incidental finding. Pt endorses intermittent mild L frontal headaches.  Will refer to ENT for further eval

## 2024-02-14 ENCOUNTER — Ambulatory Visit: Payer: Self-pay | Admitting: Family Medicine

## 2024-02-15 ENCOUNTER — Ambulatory Visit: Admitting: Gastroenterology

## 2024-02-15 ENCOUNTER — Encounter: Payer: Self-pay | Admitting: Gastroenterology

## 2024-02-15 VITALS — BP 112/63 | HR 75 | Temp 97.3°F | Resp 17 | Ht 66.0 in | Wt 183.0 lb

## 2024-02-15 DIAGNOSIS — Z85038 Personal history of other malignant neoplasm of large intestine: Secondary | ICD-10-CM

## 2024-02-15 DIAGNOSIS — D122 Benign neoplasm of ascending colon: Secondary | ICD-10-CM

## 2024-02-15 DIAGNOSIS — D125 Benign neoplasm of sigmoid colon: Secondary | ICD-10-CM | POA: Diagnosis not present

## 2024-02-15 DIAGNOSIS — Z860100 Personal history of colon polyps, unspecified: Secondary | ICD-10-CM

## 2024-02-15 DIAGNOSIS — Z1211 Encounter for screening for malignant neoplasm of colon: Secondary | ICD-10-CM

## 2024-02-15 DIAGNOSIS — K5730 Diverticulosis of large intestine without perforation or abscess without bleeding: Secondary | ICD-10-CM

## 2024-02-15 DIAGNOSIS — D123 Benign neoplasm of transverse colon: Secondary | ICD-10-CM

## 2024-02-15 DIAGNOSIS — K648 Other hemorrhoids: Secondary | ICD-10-CM

## 2024-02-15 MED ORDER — SODIUM CHLORIDE 0.9 % IV SOLN: 500.0000 mL | INTRAVENOUS | Status: DC

## 2024-02-15 NOTE — Op Note (Signed)
 [facility] Patient Name: Lisa Osborne Procedure Date: 02/15/2024 1:28 PM MRN: [id number] Endoscopist: Elspeth P. Leigh , MD, [id number] Age: 84 Referring MD:  Date of Birth: October 11, 1940 Gender: Female Account #: [id number] Procedure:                Colonoscopy Indications:              High risk colon cancer surveillance: Personal                            history of colonic polyps, Personal history of                            colon cancer - CRC dx 2014 s/p resection, exam in                            09/2020 with 10 polyps removed. Last exam 12/2023                            limited by poor prep, double prep for this exam. Medicines:                Monitored Anesthesia Care Procedure:                Pre-Anesthesia Assessment:                           - Prior to the procedure, a History and Physical                            was performed, and patient medications and                            allergies were reviewed. The patient's tolerance of                            previous anesthesia was also reviewed. The risks                            and benefits of the procedure and the sedation                            options and risks were discussed with the patient.                            All questions were answered, and informed consent                            was obtained. Prior Anticoagulants: The patient has                            taken no anticoagulant or antiplatelet agents. ASA                            Grade Assessment: III - A patient with severe  systemic disease. After reviewing the risks and                            benefits, the patient was deemed in satisfactory                            condition to undergo the procedure.                           After obtaining informed consent, the colonoscope                            was passed under direct vision. Throughout the                             procedure, the patient's blood pressure, pulse, and                            oxygen saturations were monitored continuously. The                            Olympus Scope [id number] was introduced through the                            anus and advanced to the the terminal ileum, with                            identification of the appendiceal orifice and IC                            valve. The colonoscopy was performed without                            difficulty. The patient tolerated the procedure                            well. The quality of the bowel preparation was                            good. The ileocecal valve, appendiceal orifice, and                            rectum were photographed. Scope In: 1:31:34 PM Scope Out: 1:53:46 PM Scope Withdrawal Time: 0 hours 18 minutes 0 seconds  Total Procedure Duration: 0 hours 22 minutes 12 seconds  Findings:                 The perianal and digital rectal examinations were                            normal other than skin tags.                           A few small-mouthed diverticula were found in the  ascending colon.                           A 3 mm polyp was found in the ascending colon. The                            polyp was sessile. The polyp was removed with a                            cold snare. Resection and retrieval were complete.                           A 4 mm polyp was found in the hepatic flexure. The                            polyp was sessile. The polyp was removed with a                            cold snare. Resection and retrieval were complete.                           A 3 mm polyp was found in the transverse colon. The                            polyp was sessile. The polyp was removed with a                            cold snare. Resection and retrieval were complete.                           A 4 mm polyp was found in the sigmoid colon. The                            polyp  was sessile. The polyp was removed with a                            cold snare. Resection and retrieval were complete.                           There was evidence of a prior end-to-end                            colo-colonic anastomosis in the sigmoid colon. This                            was patent and was characterized by healthy                            appearing mucosa.                           Internal hemorrhoids were found during  retroflexion. The hemorrhoids were small.                           The exam was otherwise without abnormality. Complications:            No immediate complications. Estimated blood loss:                            Minimal. Estimated Blood Loss:     Estimated blood loss was minimal. Impression:               - Diverticulosis in the ascending colon.                           - One 3 mm polyp in the ascending colon, removed                            with a cold snare. Resected and retrieved.                           - One 4 mm polyp at the hepatic flexure, removed                            with a cold snare. Resected and retrieved.                           - One 3 mm polyp in the transverse colon, removed                            with a cold snare. Resected and retrieved.                           - One 4 mm polyp in the sigmoid colon, removed with                            a cold snare. Resected and retrieved.                           - Patent end-to-end colo-colonic anastomosis,                            characterized by healthy appearing mucosa.                           - Internal hemorrhoids.                           - The examination was otherwise normal. Recommendation:           - Patient has a contact number available for                            emergencies. The signs and symptoms of potential  delayed complications were discussed with the                            patient.  Return to normal activities tomorrow.                            Written discharge instructions were provided to the                            patient.                           - Resume previous diet.                           - Continue present medications.                           - Await pathology results.                           - No further surveillance are warranted given no                            high risk lesions on this exam (the patient would                            not normally be due for another 5 years given the                            findings on this exam, at which time she will be 84                            years old) Elspeth P. Averyana Pillars, MD 02/15/2024 2:01:37 PM This report has been signed electronically.

## 2024-02-15 NOTE — Patient Instructions (Signed)

## 2024-02-15 NOTE — Progress Notes (Signed)
 [facility] History and Physical   Primary Care Physician:  Rilla Baller, MD   Reason for Procedure:   History of colon polyps / colon cancer  Plan:    colonoscopy     HPI: Lisa Osborne is a 84 y.o. female  here for colonoscopy  surveillance - history of CRC dx 2014, additionally has had polyps, 10 polyps removed 09/2020. LAst exam 12/2013 limited by poor prep. Double prep for this exam, she states has worked much  better. Patient denies any bowel symptoms at this time. Otherwise feels well without any cardiopulmonary symptoms.   I have discussed risks / benefits of anesthesia and endoscopic procedure with Rudell Kayla Lefort and they wish to proceed with the exams as outlined today.   The patient was provided an opportunity to ask questions and all were answered. The patient agreed with the plan.    Past Medical History:  Diagnosis Date   Abnormal EKG    incomplete RBBB   Acquired deafness of left ear    Acute cholecystitis    Allergy    Anemia    Anxiety    Arthritis    left side lower back pain   Brown recluse spider bite 03/2018   Colon adenocarcinoma - rectosigmoid T3N1 (1/15) s/p lap LAR 04/14/2012   Status post laparoscopic lower anterior resection   Dehydration 02/2015   Depression    severe in past   Diabetes mellitus type 2, controlled (HCC) 2006   diet controlled   GERD (gastroesophageal reflux disease)    bad if off PPI   History of chicken pox    History of iron deficiency anemia    HLD (hyperlipidemia) 08/03/2015   Hypothyroidism    mild   Laceration of fifth toe, right 08/26/2017   Neuromuscular disorder (HCC)    neuropathy both great toes   Personal history of chemotherapy    Rosacea    Seasonal allergies    Unilateral deafnesses    left ear deaf, wears hearing aid right side   Urine incontinence    mild, wears pad    Past Surgical History:  Procedure Laterality Date   ABDOMINAL HYSTERECTOMY     Approx 1980?    ABDOMINOPLASTY  1996   CHOLECYSTECTOMY N/A 03/20/2015   Procedure: LAPAROSCOPIC CHOLECYSTECTOMY;  Surgeon: Charlie FORBES Fell, MD;  Location: [facility];  Service: General;  Laterality: N/A;   COLONOSCOPY  08/2006   normal   COLONOSCOPY  03/2013   2 polyps, mild diverticulosis, rpt 3 yrs Verda)   COLONOSCOPY  03/2016   5 TAs, fair prep, patent anastomosis, rpt 6-12 mo (Bernie Ransford)   COLONOSCOPY  01/2017   diverticulosis, healthy anastomosis, rpt 5 yrs (Riad Wagley)   COLONOSCOPY  09/2020   TA x10, int hem, consider rpt 3 yrs (Eithel Ryall)   FACELIFT  05/2011   INCONTINENCE SURGERY  1996   LAPAROSCOPIC LOW ANTERIOR RESECTION N/A 04/14/2012   colon cancer at rectosigmoid junction s/p rigid proctoscopy splenic flexure mobilization lysis of adhesion;  Surgeon: Elspeth KYM Schultze, MD;    MOUTH SURGERY     crown repair   TOTAL VAGINAL HYSTERECTOMY  1985   cancer cells on cervix    Prior to Admission medications  Medication Sig Start Date End Date Taking? Authorizing Provider  atorvastatin  (LIPITOR) 10 MG tablet Take 1 tablet (10 mg total) by mouth daily. 04/01/23  Yes Rilla Baller, MD  Calcium  Citrate-Vitamin D  (CALCIUM  CITRATE + D PO) Take 1 tablet by mouth daily.  Yes [provider]  Cholecalciferol  (VITAMIN D3) 25 MCG (1000 UT) CAPS Take 1 capsule (1,000 Units total) by mouth daily. 04/01/23  Yes Rilla Baller, MD  Coenzyme Q10 (COQ10) 200 MG CAPS Take 1 capsule by mouth daily.    Yes [provider]  lansoprazole  (PREVACID ) 15 MG capsule Take 1 capsule (15 mg total) by mouth daily. 04/01/23  Yes Rilla Baller, MD  levothyroxine  (SYNTHROID ) 25 MCG tablet Take 1 tablet (25 mcg total) by mouth daily. 04/01/23  Yes Rilla Baller, MD  Na Sulfate-K Sulfate-Mg Sulfate concentrate (SUPREP) 17.5-3.13-1.6 GM/177ML SOLN SMARTSIG:Once Patient taking differently: Will start taking 02/16/2024 01/12/24  Yes [provider]  Omega-3 Fatty Acids (FISH OIL) 1000 MG CAPS  Take 1 capsule by mouth daily.   Yes [provider]  sertraline  (ZOLOFT ) 100 MG tablet Take 1 tablet (100 mg total) by mouth daily. 04/01/23  Yes Rilla Baller, MD  vitamin B-12 (CYANOCOBALAMIN ) 1000 MCG tablet Take 1,000 mcg by mouth daily.   Yes [provider]  Ferrous Sulfate (IRON) 325 (65 Fe) MG TABS Take by mouth daily.    [provider]  Melatonin 5 MG TABS Take 5 mg by mouth at bedtime.     [provider]  ondansetron  (ZOFRAN -ODT) 4 MG disintegrating tablet Take 1 tablet (4 mg total) by mouth every 8 (eight) hours as needed for nausea or vomiting. 01/30/24   Torrence Marry RAMAN, PA-C    Current Outpatient Medications  Medication Sig Dispense Refill   atorvastatin  (LIPITOR) 10 MG tablet Take 1 tablet (10 mg total) by mouth daily. 90 tablet 4   Calcium  Citrate-Vitamin D  (CALCIUM  CITRATE + D PO) Take 1 tablet by mouth daily.      Cholecalciferol  (VITAMIN D3) 25 MCG (1000 UT) CAPS Take 1 capsule (1,000 Units total) by mouth daily.     Coenzyme Q10 (COQ10) 200 MG CAPS Take 1 capsule by mouth daily.      lansoprazole  (PREVACID ) 15 MG capsule Take 1 capsule (15 mg total) by mouth daily. 90 capsule 4   levothyroxine  (SYNTHROID ) 25 MCG tablet Take 1 tablet (25 mcg total) by mouth daily. 90 tablet 4   Na Sulfate-K Sulfate-Mg Sulfate concentrate (SUPREP) 17.5-3.13-1.6 GM/177ML SOLN SMARTSIG:Once (Patient taking differently: Will start taking 02/16/2024)     Omega-3 Fatty Acids (FISH OIL) 1000 MG CAPS Take 1 capsule by mouth daily.     sertraline  (ZOLOFT ) 100 MG tablet Take 1 tablet (100 mg total) by mouth daily. 90 tablet 4   vitamin B-12 (CYANOCOBALAMIN ) 1000 MCG tablet Take 1,000 mcg by mouth daily.     Ferrous Sulfate (IRON) 325 (65 Fe) MG TABS Take by mouth daily.     Melatonin 5 MG TABS Take 5 mg by mouth at bedtime.      ondansetron  (ZOFRAN -ODT) 4 MG disintegrating tablet Take 1 tablet (4 mg total) by mouth every 8 (eight) hours as needed for nausea or  vomiting. 20 tablet 0   Current Facility-Administered Medications  Medication Dose Route Frequency Provider Last Rate Last Admin   0.9 %  sodium chloride  infusion  500 mL Intravenous Continuous Lorice Lafave, Elspeth SQUIBB, MD        Allergies as of 02/15/2024 - Review Complete 02/15/2024  Allergen Reaction Noted   Latex  11/29/2010   Nickel  11/26/2010    Family History  Problem Relation Age of Onset   Breast cancer Mother 16       breast cancer then multiple myeloma   Alcohol abuse  Father    Heart disease Father        heart failure   Mental illness Brother        2 brothers severe depression, suicide x2   Diabetes Maternal Aunt    Diabetes Maternal Uncle    Diabetes Paternal Uncle    Diabetes Paternal Grandfather    Coronary artery disease Neg Hx    Stroke Neg Hx    Colon cancer Neg Hx    Esophageal cancer Neg Hx    Rectal cancer Neg Hx    Stomach cancer Neg Hx    Liver disease Neg Hx    Pancreatic cancer Neg Hx    Prostate cancer Neg Hx    Colon polyps Neg Hx     Social History   Socioeconomic History   Marital status: Single    Spouse name: Not on file   Number of children: 1   Years of education: Not on file   Highest education level: Not on file  Occupational History   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  1 daughter Jesusa), 3 stillborns   Occupation: retired, was Air Traffic Controller.    Activity: no regular activity but does park far away, has treadmill at home   Diet: good fruits/vegetables, more fruit/vegetable smoothies  good amt water    Social Drivers of Health   Tobacco Use: Medium Risk (02/15/2024)   Patient History    Smoking Tobacco Use: Former    Smokeless Tobacco Use: Never    Passive Exposure: Not on file  Financial Resource Strain: Low Risk (12/24/2021)   Overall Financial Resource Strain (CARDIA)    Difficulty of Paying Living Expenses: Not hard at all  Food Insecurity: No Food Insecurity (04/19/2023)   Hunger Vital Sign    Worried About Running Out of Food in the Last Year: Never true    Ran Out of Food in the Last Year: Never true  Transportation Needs: No Transportation Needs (04/19/2023)   PRAPARE - Administrator, Civil Service (Medical): No    Lack of Transportation (Non-Medical): No  Physical Activity: Insufficiently Active (12/24/2021)   Exercise Vital Sign    Days of Exercise per Week: 2 days    Minutes of Exercise per Session: 30 min  Stress: No Stress Concern Present (12/24/2021)   [employer] of Occupational Health - Occupational Stress Questionnaire    Feeling of Stress : Only a little  Social Connections: Socially Isolated (12/24/2021)   Social Connection and Isolation Panel    Frequency of Communication with Friends and Family: More than three times a week    Frequency of Social Gatherings with Friends and Family: Once a week    Attends Religious Services: Never    Database Administrator or Organizations:  No    Attends Club or Organization Meetings: Never    Marital Status: Divorced  Catering Manager Violence: Not At Risk (04/19/2023)   Humiliation, Afraid, Rape, and Kick questionnaire    Fear of Current or Ex-Partner: No    Emotionally Abused: No    Physically Abused: No    Sexually Abused: No  Depression (PHQ2-9): Low Risk (02/13/2024)   Depression (PHQ2-9)    PHQ-2 Score: 4  Alcohol Screen: Low Risk  (12/24/2021)   Alcohol Screen    Last Alcohol Screening Score (AUDIT): 2  Housing: Unknown (04/19/2023)   Housing Stability Vital Sign    Unable to Pay for Housing in the Last Year: No    Number of Times Moved in the Last Year: Not on file    Homeless in the Last Year: No  Utilities: Not At Risk (04/19/2023)   [facility] Utilities    Threatened with loss of utilities: No  Health Literacy: Not on file    Review of Systems: All other review of systems negative except as mentioned in the HPI.  Physical Exam: Vital signs BP (!) 104/48   Pulse 62   Temp (!) 97.3 F (36.3 C) (Temporal)   Resp 14   Ht 5' 6 (1.676 m)   Wt 183 lb (83 kg)   SpO2 100%   BMI 29.54 kg/m   General:   Alert,  Well-developed, pleasant and cooperative in NAD Lungs:  Clear throughout to auscultation.   Heart:  Regular rate and rhythm Abdomen:  Soft, nontender and nondistended.   Neuro/Psych:  Alert and cooperative. Normal mood and affect. A and O x 3  Marcey Naval, MD [facility]

## 2024-02-15 NOTE — Progress Notes (Signed)
 Pt's states no medical or surgical changes since previsit or office visit.

## 2024-02-15 NOTE — Progress Notes (Signed)
 Sedate, gd SR, tolerated procedure well, VSS, report to RN

## 2024-02-16 ENCOUNTER — Telehealth: Payer: Self-pay

## 2024-02-16 NOTE — Telephone Encounter (Signed)
 Follow up call to pt, lm for pt to call if having any difficulty with normal activities or eating and drinking.  Also to call if any other questions or concerns.

## 2024-02-20 ENCOUNTER — Ambulatory Visit: Payer: Self-pay | Admitting: Gastroenterology

## 2024-02-20 LAB — SURGICAL PATHOLOGY

## 2024-03-01 ENCOUNTER — Other Ambulatory Visit: Payer: Self-pay | Admitting: Medical Genetics

## 2024-03-01 DIAGNOSIS — Z006 Encounter for examination for normal comparison and control in clinical research program: Secondary | ICD-10-CM

## 2024-10-17 ENCOUNTER — Ambulatory Visit: Admitting: Dermatology
